# Patient Record
Sex: Female | Born: 1935
Health system: Southern US, Community
[De-identification: ages and names within clinical notes are randomized; demographics above are authoritative.]

## PROBLEM LIST (undated history)

## (undated) DIAGNOSIS — E785 Hyperlipidemia, unspecified: Secondary | ICD-10-CM

## (undated) DIAGNOSIS — I700 Atherosclerosis of aorta: Secondary | ICD-10-CM

## (undated) DIAGNOSIS — R0602 Shortness of breath: Secondary | ICD-10-CM

## (undated) DIAGNOSIS — I10 Essential (primary) hypertension: Secondary | ICD-10-CM

## (undated) DIAGNOSIS — J449 Chronic obstructive pulmonary disease, unspecified: Secondary | ICD-10-CM

## (undated) DIAGNOSIS — M1990 Unspecified osteoarthritis, unspecified site: Secondary | ICD-10-CM

## (undated) DIAGNOSIS — K219 Gastro-esophageal reflux disease without esophagitis: Secondary | ICD-10-CM

## (undated) DIAGNOSIS — R079 Chest pain, unspecified: Secondary | ICD-10-CM

## (undated) DIAGNOSIS — Z9289 Personal history of other medical treatment: Secondary | ICD-10-CM

## (undated) DIAGNOSIS — I2510 Atherosclerotic heart disease of native coronary artery without angina pectoris: Secondary | ICD-10-CM

## (undated) HISTORY — PX: EYE SURGERY: SHX253

## (undated) HISTORY — DX: Shortness of breath: R06.02

## (undated) HISTORY — DX: Chest pain, unspecified: R07.9

## (undated) HISTORY — PX: HERNIA REPAIR: SHX51

## (undated) HISTORY — PX: TUBAL LIGATION: SHX77

## (undated) HISTORY — PX: HAND SURGERY: SHX662

---

## 1996-10-14 HISTORY — PX: JOINT REPLACEMENT: SHX530

## 1999-04-25 ENCOUNTER — Other Ambulatory Visit: Admission: RE | Admit: 1999-04-25 | Discharge: 1999-04-25 | Payer: Self-pay | Admitting: Family Medicine

## 2001-07-28 ENCOUNTER — Other Ambulatory Visit: Admission: RE | Admit: 2001-07-28 | Discharge: 2001-07-28 | Payer: Self-pay | Admitting: Internal Medicine

## 2002-08-28 ENCOUNTER — Ambulatory Visit (HOSPITAL_COMMUNITY): Admission: RE | Admit: 2002-08-28 | Discharge: 2002-08-28 | Payer: Self-pay | Admitting: Internal Medicine

## 2002-08-28 ENCOUNTER — Encounter: Payer: Self-pay | Admitting: Internal Medicine

## 2002-12-22 ENCOUNTER — Ambulatory Visit (HOSPITAL_COMMUNITY): Admission: RE | Admit: 2002-12-22 | Discharge: 2002-12-22 | Payer: Self-pay | Admitting: Gastroenterology

## 2006-09-20 ENCOUNTER — Emergency Department (HOSPITAL_COMMUNITY): Admission: EM | Admit: 2006-09-20 | Discharge: 2006-09-20 | Payer: Self-pay | Admitting: Emergency Medicine

## 2006-09-25 ENCOUNTER — Encounter: Admission: RE | Admit: 2006-09-25 | Discharge: 2006-09-25 | Payer: Self-pay | Admitting: Internal Medicine

## 2012-02-06 DIAGNOSIS — M25569 Pain in unspecified knee: Secondary | ICD-10-CM | POA: Diagnosis not present

## 2012-02-06 DIAGNOSIS — I10 Essential (primary) hypertension: Secondary | ICD-10-CM | POA: Diagnosis not present

## 2012-02-06 DIAGNOSIS — Z79899 Other long term (current) drug therapy: Secondary | ICD-10-CM | POA: Diagnosis not present

## 2012-02-06 DIAGNOSIS — E785 Hyperlipidemia, unspecified: Secondary | ICD-10-CM | POA: Diagnosis not present

## 2012-02-14 DIAGNOSIS — M1710 Unilateral primary osteoarthritis, unspecified knee: Secondary | ICD-10-CM | POA: Diagnosis not present

## 2012-03-03 DIAGNOSIS — I10 Essential (primary) hypertension: Secondary | ICD-10-CM | POA: Diagnosis not present

## 2012-03-03 DIAGNOSIS — M810 Age-related osteoporosis without current pathological fracture: Secondary | ICD-10-CM | POA: Diagnosis not present

## 2012-03-31 DIAGNOSIS — H11159 Pinguecula, unspecified eye: Secondary | ICD-10-CM | POA: Diagnosis not present

## 2012-03-31 DIAGNOSIS — H25019 Cortical age-related cataract, unspecified eye: Secondary | ICD-10-CM | POA: Diagnosis not present

## 2012-03-31 DIAGNOSIS — H2510 Age-related nuclear cataract, unspecified eye: Secondary | ICD-10-CM | POA: Diagnosis not present

## 2012-03-31 DIAGNOSIS — H40009 Preglaucoma, unspecified, unspecified eye: Secondary | ICD-10-CM | POA: Diagnosis not present

## 2012-05-15 DIAGNOSIS — M1710 Unilateral primary osteoarthritis, unspecified knee: Secondary | ICD-10-CM | POA: Diagnosis not present

## 2012-05-20 DIAGNOSIS — I10 Essential (primary) hypertension: Secondary | ICD-10-CM | POA: Diagnosis not present

## 2012-05-20 DIAGNOSIS — E7800 Pure hypercholesterolemia, unspecified: Secondary | ICD-10-CM | POA: Diagnosis not present

## 2012-05-20 DIAGNOSIS — Z01810 Encounter for preprocedural cardiovascular examination: Secondary | ICD-10-CM | POA: Diagnosis not present

## 2012-05-20 DIAGNOSIS — R9431 Abnormal electrocardiogram [ECG] [EKG]: Secondary | ICD-10-CM | POA: Diagnosis not present

## 2012-05-20 DIAGNOSIS — Z87891 Personal history of nicotine dependence: Secondary | ICD-10-CM | POA: Diagnosis not present

## 2012-05-27 ENCOUNTER — Encounter (HOSPITAL_COMMUNITY): Payer: Self-pay

## 2012-05-27 DIAGNOSIS — Z01810 Encounter for preprocedural cardiovascular examination: Secondary | ICD-10-CM | POA: Diagnosis not present

## 2012-05-27 DIAGNOSIS — Z87891 Personal history of nicotine dependence: Secondary | ICD-10-CM | POA: Diagnosis not present

## 2012-05-27 DIAGNOSIS — R9431 Abnormal electrocardiogram [ECG] [EKG]: Secondary | ICD-10-CM | POA: Diagnosis not present

## 2012-05-27 DIAGNOSIS — Z9289 Personal history of other medical treatment: Secondary | ICD-10-CM

## 2012-05-27 DIAGNOSIS — I10 Essential (primary) hypertension: Secondary | ICD-10-CM | POA: Diagnosis not present

## 2012-05-27 HISTORY — DX: Personal history of other medical treatment: Z92.89

## 2012-06-02 ENCOUNTER — Ambulatory Visit (HOSPITAL_COMMUNITY)
Admission: RE | Admit: 2012-06-02 | Discharge: 2012-06-02 | Disposition: A | Discharge status: Home / Self Care | Payer: Medicare Other | Source: Ambulatory Visit | Attending: Orthopedic Surgery | Admitting: Orthopedic Surgery

## 2012-06-02 ENCOUNTER — Encounter (HOSPITAL_COMMUNITY): Payer: Self-pay

## 2012-06-02 ENCOUNTER — Encounter (HOSPITAL_COMMUNITY)
Admission: RE | Admit: 2012-06-02 | Discharge: 2012-06-02 | Disposition: A | Discharge status: Home / Self Care | Payer: Medicare Other | Source: Ambulatory Visit | Attending: Orthopedic Surgery | Admitting: Orthopedic Surgery

## 2012-06-02 DIAGNOSIS — R0602 Shortness of breath: Secondary | ICD-10-CM | POA: Insufficient documentation

## 2012-06-02 DIAGNOSIS — Z01812 Encounter for preprocedural laboratory examination: Secondary | ICD-10-CM | POA: Diagnosis not present

## 2012-06-02 DIAGNOSIS — Z01818 Encounter for other preprocedural examination: Secondary | ICD-10-CM | POA: Diagnosis not present

## 2012-06-02 DIAGNOSIS — I517 Cardiomegaly: Secondary | ICD-10-CM | POA: Diagnosis not present

## 2012-06-02 DIAGNOSIS — I10 Essential (primary) hypertension: Secondary | ICD-10-CM | POA: Insufficient documentation

## 2012-06-02 HISTORY — DX: Essential (primary) hypertension: I10

## 2012-06-02 HISTORY — DX: Unspecified osteoarthritis, unspecified site: M19.90

## 2012-06-02 HISTORY — DX: Hyperlipidemia, unspecified: E78.5

## 2012-06-02 HISTORY — DX: Shortness of breath: R06.02

## 2012-06-02 HISTORY — DX: Gastro-esophageal reflux disease without esophagitis: K21.9

## 2012-06-02 HISTORY — DX: Personal history of other medical treatment: Z92.89

## 2012-06-02 LAB — CBC
Hemoglobin: 13.7 g/dL (ref 12.0–15.0)
MCH: 28.1 pg (ref 26.0–34.0)
Platelets: 261 10*3/uL (ref 150–400)
RBC: 4.87 MIL/uL (ref 3.87–5.11)
WBC: 8.1 10*3/uL (ref 4.0–10.5)

## 2012-06-02 LAB — APTT: aPTT: 35 seconds (ref 24–37)

## 2012-06-02 LAB — URINALYSIS, ROUTINE W REFLEX MICROSCOPIC
Leukocytes, UA: NEGATIVE
Nitrite: NEGATIVE
Protein, ur: NEGATIVE mg/dL
Specific Gravity, Urine: 1.02 (ref 1.005–1.030)
Urobilinogen, UA: 0.2 mg/dL (ref 0.0–1.0)

## 2012-06-02 LAB — BASIC METABOLIC PANEL
Calcium: 9.9 mg/dL (ref 8.4–10.5)
GFR calc non Af Amer: 83 mL/min — ABNORMAL LOW (ref >90)
Glucose, Bld: 98 mg/dL (ref 70–99)
Potassium: 3.7 mEq/L (ref 3.5–5.1)
Sodium: 137 mEq/L (ref 135–145)

## 2012-06-02 LAB — PROTIME-INR
INR: 0.95 (ref 0.00–1.49)
Prothrombin Time: 12.9 seconds (ref 11.6–15.2)

## 2012-06-02 LAB — SURGICAL PCR SCREEN: MRSA, PCR: NEGATIVE

## 2012-06-02 NOTE — Pre-Procedure Instructions (Signed)
PT'S NUCLEAR STRESS TEST REPORT RECEIVED AND ON CHART -NO ISCHEMIA, EF 71%

## 2012-06-02 NOTE — Patient Instructions (Signed)
YOUR SURGERY IS SCHEDULED ON:  Tuesday  8/27  AT 10:30 AM   REPORT TO [facility] AT:  8:00 AM      PHONE # FOR SHORT STAY IS [phone]  DO NOT EAT OR DRINK ANYTHING AFTER MIDNIGHT THE NIGHT BEFORE YOUR SURGERY.  YOU MAY BRUSH YOUR TEETH, RINSE OUT YOUR MOUTH--BUT NO WATER, NO FOOD, NO CHEWING GUM, NO MINTS, NO CANDIES, NO CHEWING TOBACCO.  PLEASE TAKE THE FOLLOWING MEDICATIONS THE AM OF YOUR SURGERY WITH A FEW SIPS OF WATER:    AMLODIPINE    IF YOU USE INHALERS--USE YOUR INHALERS THE AM OF YOUR SURGERY AND BRING INHALERS TO THE HOSPITAL -TAKE TO SURGERY.    IF YOU ARE DIABETIC:  DO NOT TAKE ANY DIABETIC MEDICATIONS THE AM OF YOUR SURGERY.  IF YOU TAKE INSULIN IN THE EVENINGS--PLEASE ONLY TAKE 1/2 NORMAL EVENING DOSE THE NIGHT BEFORE YOUR SURGERY.  NO INSULIN THE AM OF YOUR SURGERY.  IF YOU HAVE SLEEP APNEA AND USE CPAP OR BIPAP--PLEASE BRING THE MASK --NOT THE MACHINE-NOT THE TUBING   -JUST THE MASK. DO NOT BRING VALUABLES, MONEY, CREDIT CARDS.  CONTACT LENS, DENTURES / PARTIALS, GLASSES SHOULD NOT BE WORN TO SURGERY AND IN MOST CASES-HEARING AIDS WILL NEED TO BE REMOVED.  BRING YOUR GLASSES CASE, ANY EQUIPMENT NEEDED FOR YOUR CONTACT LENS. FOR PATIENTS ADMITTED TO THE HOSPITAL--CHECK OUT TIME THE DAY OF DISCHARGE IS 11:00 AM.  ALL INPATIENT ROOMS ARE PRIVATE - WITH BATHROOM, TELEPHONE, TELEVISION AND WIFI INTERNET. IF YOU ARE BEING DISCHARGED THE SAME DAY OF YOUR SURGERY--YOU CAN NOT DRIVE YOURSELF HOME--AND SHOULD NOT GO HOME ALONE BY TAXI OR BUS.  NO DRIVING OR OPERATING MACHINERY FOR 24 HOURS FOLLOWING ANESTHESIA / PAIN MEDICATIONS.                            SPECIAL INSTRUCTIONS:  CHLORHEXIDINE SOAP SHOWER (other brand names are Betasept and Hibiclens ) PLEASE SHOWER WITH CHLORHEXIDINE THE NIGHT BEFORE YOUR SURGERY AND THE AM OF YOUR SURGERY. DO NOT USE CHLORHEXIDINE ON YOUR FACE OR PRIVATE AREAS--YOU MAY USE YOUR NORMAL SOAP THOSE AREAS AND YOUR NORMAL SHAMPOO.    WOMEN SHOULD AVOID SHAVING UNDER ARMS AND SHAVING LEGS 48 HOURS BEFORE USING CHLORHEXIDINE TO AVOID SKIN IRRITATION.  DO NOT USE IF ALLERGIC TO CHLORHEXIDINE.  PLEASE READ OVER ANY  FACT SHEETS THAT YOU WERE GIVEN: MRSA INFORMATION, BLOOD TRANSFUSION INFORMATION, INCENTIVE SPIROMETER INFORMATION.

## 2012-06-02 NOTE — Pre-Procedure Instructions (Signed)
EKG AND CARDIOLOGY OFFICE NOTE ON CHART FROM DR. GANJI -DATED 05/20/12-PREOP EVALUATION FOR CLEARANCE FOR LEFT TOTAL KNEE ARTHROPLASTY.   NOTES SAID STRESS TEST TO BE DONE--AND RESULTS TO BE SENT TO DR. Charlann Boxer.  DR. Nilsa Nutting OFFICE FAXED CARDIAC CLEARANCE FROM DR. GANJI-CLEARANCE PLACED ON PT'S CHART.  REQUESTED DR. GANJI'S OFFICE FAX NUCLEAR STRESS TEST REPORT. CBC, BMET, PT, PTT, UA, CXR WERE DONE TODAY PREOP AT [facility].  T/S WILL BE DONE DAY OF SURGERY. PREOP INSTRUCTIONS DISCUSSED WITH PT USING THE TEACH BACK METHOD.

## 2012-06-03 NOTE — H&P (Signed)
Kaitlin Arnold is an 76 y.o. female.    Chief Complaint: left knee OA and pain   HPI: Pt is a 76 y.o. female complaining of left knee pain for years, significantly increasing over the last year. Pain had continually increased since the beginning. X-rays in the clinic show end-stage arthritic changes of the left knee. Pt has tried various conservative treatments which have failed to alleviate their symptoms, including NSAIDs and steroid injections. Various options are discussed with the patient. Risks, benefits and expectations were discussed with the patient. Patient understand the risks, benefits and expectations and wishes to proceed with surgery.   PCP:  Alva Garnet., MD  D/C Plans:  Home with HHPT  Post-op Meds:   Rx given for ASA, Robaxin, Iron, Colace and MiraLax  Tranexamic Acid:   To be given  Decadron:   To be given  PMH: Past Medical History  Diagnosis Date  . Hypertension   . Hyperlipidemia   . H/O cardiovascular stress test 05/27/12    STRESS TEST WAS DONE for cardiac clearance for left total knee replacement--given clearance by dr. Jacinto Halim.   EF 71%, NO ISCHEMIA  . Shortness of breath     with exertion  . GERD (gastroesophageal reflux disease)     seldom  . Arthritis     oa and pain left knee;  previous right total knee replacement  1998    PSH: Past Surgical History  Procedure Date  . Joint replacement 1998    right total knee replacement  . Hernia repair     38 yrs ago    Social History:  reports that she has been smoking Cigarettes.  She has smoked for the past 40 years. She does not have any smokeless tobacco history on file. She reports that she does not drink alcohol or use illicit drugs.  Allergies:  No Known Allergies  Medications: No current facility-administered medications for this encounter.   Current Outpatient Prescriptions  Medication Sig Dispense Refill  . amLODipine (NORVASC) 5 MG tablet Take 5 mg by mouth every morning.      Marland Kitchen  aspirin 81 MG tablet Take 81 mg by mouth daily.      . Cholecalciferol (VITAMIN D3) 2000 UNITS TABS Take 2,000 Units by mouth daily.      Marland Kitchen ibuprofen (ADVIL,MOTRIN) 200 MG tablet Take 400 mg by mouth every 6 (six) hours as needed.      Marland Kitchen losartan (COZAAR) 25 MG tablet Take 25 mg by mouth at bedtime.      Marland Kitchen losartan-hydrochlorothiazide (HYZAAR) 100-25 MG per tablet Take 1 tablet by mouth daily. Takes in afternoon      . nabumetone (RELAFEN) 500 MG tablet Take 500 mg by mouth 2 (two) times daily. pain      . simvastatin (ZOCOR) 20 MG tablet Take 20 mg by mouth every evening.        Results for orders placed during the hospital encounter of 06/02/12 (from the past 48 hour(s))  SURGICAL PCR SCREEN     Status: Normal   Collection Time   06/02/12  1:08 PM      Component Value Range Comment   MRSA, PCR NEGATIVE  NEGATIVE    Staphylococcus aureus NEGATIVE  NEGATIVE   URINALYSIS, ROUTINE W REFLEX MICROSCOPIC     Status: Normal   Collection Time   06/02/12  1:31 PM      Component Value Range Comment   Color, Urine YELLOW  YELLOW    APPearance CLEAR  CLEAR    Specific Gravity, Urine 1.020  1.005 - 1.030    pH 6.0  5.0 - 8.0    Glucose, UA NEGATIVE  NEGATIVE mg/dL    Hgb urine dipstick NEGATIVE  NEGATIVE    Bilirubin Urine NEGATIVE  NEGATIVE    Ketones, ur NEGATIVE  NEGATIVE mg/dL    Protein, ur NEGATIVE  NEGATIVE mg/dL    Urobilinogen, UA 0.2  0.0 - 1.0 mg/dL    Nitrite NEGATIVE  NEGATIVE    Leukocytes, UA NEGATIVE  NEGATIVE MICROSCOPIC NOT DONE ON URINES WITH NEGATIVE PROTEIN, BLOOD, LEUKOCYTES, NITRITE, OR GLUCOSE <1000 mg/dL.  APTT     Status: Normal   Collection Time   06/02/12  1:50 PM      Component Value Range Comment   aPTT 35  24 - 37 seconds   BASIC METABOLIC PANEL     Status: Abnormal   Collection Time   06/02/12  1:50 PM      Component Value Range Comment   Sodium 137  135 - 145 mEq/L    Potassium 3.7  3.5 - 5.1 mEq/L    Chloride 101  96 - 112 mEq/L    CO2 27  19 - 32 mEq/L      Glucose, Bld 98  70 - 99 mg/dL    BUN 11  6 - 23 mg/dL    Creatinine, Ser 1.30  0.50 - 1.10 mg/dL    Calcium 9.9  8.4 - 86.5 mg/dL    GFR calc non Af Amer 83 (*) >90 mL/min    GFR calc Af Amer >90  >90 mL/min   CBC     Status: Normal   Collection Time   06/02/12  1:50 PM      Component Value Range Comment   WBC 8.1  4.0 - 10.5 K/uL    RBC 4.87  3.87 - 5.11 MIL/uL    Hemoglobin 13.7  12.0 - 15.0 g/dL    HCT 78.4  69.6 - 29.5 %    MCV 82.5  78.0 - 100.0 fL    MCH 28.1  26.0 - 34.0 pg    MCHC 34.1  30.0 - 36.0 g/dL    RDW 28.4  13.2 - 44.0 %    Platelets 261  150 - 400 K/uL   PROTIME-INR     Status: Normal   Collection Time   06/02/12  1:50 PM      Component Value Range Comment   Prothrombin Time 12.9  11.6 - 15.2 seconds    INR 0.95  0.00 - 1.49    Dg Chest 2 View  06/02/2012  *RADIOLOGY REPORT*  Clinical Data: 76 year old female preoperative study for knee surgery.  Shortness of breath with walking.  Hypertension.  CHEST - 2 VIEW  Comparison: Thoracic radiographs 09/25/2006.  Findings: Chronic eventration of the right hemidiaphragm is stable. Lung volumes are stable and within normal limits.  Chronic cardiomegaly does not appear significantly changed.  Tortuous thoracic aorta. Other mediastinal contours are within normal limits.  Visualized tracheal air column is within normal limits. No pneumothorax, pleural effusion or consolidation.  Evidence of chronic increased interstitial markings diffusely.  No confluent pulmonary opacity. No acute osseous abnormality identified.  IMPRESSION: Mild cardiomegaly and chronic pulmonary interstitial changes. No acute cardiopulmonary abnormality.   Original Report Authenticated By: Harley Hallmark, M.D.     ROS: Review of Systems  Constitutional: Negative.   HENT: Negative.   Eyes: Negative.   Respiratory: Negative.  Cardiovascular: Negative.   Gastrointestinal: Negative.   Genitourinary: Negative.   Musculoskeletal: Positive for joint pain.   Skin: Negative.   Neurological: Negative.   Endo/Heme/Allergies: Negative.   Psychiatric/Behavioral: Negative.      Physical Exam: BP:   149/71  ;  HR:   82  ; Resp:   16  : Physical Exam  Constitutional: She is oriented to person, place, and time and well-developed, well-nourished, and in no distress.  HENT:  Head: Normocephalic and atraumatic.  Nose: Nose normal.  Mouth/Throat: Oropharynx is clear and moist.  Eyes: Pupils are equal, round, and reactive to light.  Neck: Neck supple. No JVD present. No tracheal deviation present. No thyromegaly present.  Cardiovascular: Normal rate, regular rhythm, normal heart sounds and intact distal pulses.   Pulmonary/Chest: Effort normal and breath sounds normal.  Abdominal: Soft. There is no tenderness. There is no guarding.  Musculoskeletal:       Left knee: She exhibits decreased range of motion (5-85), swelling and bony tenderness. She exhibits no effusion, no ecchymosis, no deformity, no laceration and no erythema. tenderness found.  Lymphadenopathy:    She has no cervical adenopathy.  Neurological: She is alert and oriented to person, place, and time.  Skin: Skin is warm and dry.  Psychiatric: Affect normal.      Assessment/Plan Assessment: left knee OA and pain   Plan: Patient will undergo a left total knee arthroplasty on 06/09/2012 per Dr. Charlann Boxer at [facility]. Risks benefits and expectations were discussed with the patient. Patient understand risks, benefits and expectations and wishes to proceed.   Anastasio Auerbach Lashanta Elbe   PAC  06/03/2012, 12:20 PM

## 2012-06-09 ENCOUNTER — Encounter (HOSPITAL_COMMUNITY): Payer: Self-pay | Admitting: Anesthesiology

## 2012-06-09 ENCOUNTER — Encounter (HOSPITAL_COMMUNITY): Payer: Self-pay | Admitting: Orthopedic Surgery

## 2012-06-09 ENCOUNTER — Inpatient Hospital Stay (HOSPITAL_COMMUNITY)
Admission: RE | Admit: 2012-06-09 | Discharge: 2012-06-11 | DRG: 470 | Disposition: A | Discharge status: Home Health | Payer: Medicare Other | Source: Ambulatory Visit | Attending: Orthopedic Surgery | Admitting: Orthopedic Surgery

## 2012-06-09 ENCOUNTER — Encounter (HOSPITAL_COMMUNITY)
Admission: RE | Disposition: A | Discharge status: Home Health | Payer: Self-pay | Source: Ambulatory Visit | Attending: Orthopedic Surgery

## 2012-06-09 ENCOUNTER — Encounter (HOSPITAL_COMMUNITY): Payer: Self-pay | Admitting: *Deleted

## 2012-06-09 ENCOUNTER — Ambulatory Visit (HOSPITAL_COMMUNITY): Payer: Medicare Other | Admitting: Anesthesiology

## 2012-06-09 DIAGNOSIS — E871 Hypo-osmolality and hyponatremia: Secondary | ICD-10-CM | POA: Diagnosis not present

## 2012-06-09 DIAGNOSIS — D62 Acute posthemorrhagic anemia: Secondary | ICD-10-CM | POA: Diagnosis not present

## 2012-06-09 DIAGNOSIS — Z79899 Other long term (current) drug therapy: Secondary | ICD-10-CM

## 2012-06-09 DIAGNOSIS — E785 Hyperlipidemia, unspecified: Secondary | ICD-10-CM | POA: Diagnosis present

## 2012-06-09 DIAGNOSIS — M1710 Unilateral primary osteoarthritis, unspecified knee: Secondary | ICD-10-CM | POA: Diagnosis not present

## 2012-06-09 DIAGNOSIS — Z96659 Presence of unspecified artificial knee joint: Secondary | ICD-10-CM

## 2012-06-09 DIAGNOSIS — I10 Essential (primary) hypertension: Secondary | ICD-10-CM | POA: Diagnosis present

## 2012-06-09 DIAGNOSIS — D500 Iron deficiency anemia secondary to blood loss (chronic): Secondary | ICD-10-CM

## 2012-06-09 DIAGNOSIS — IMO0002 Reserved for concepts with insufficient information to code with codable children: Secondary | ICD-10-CM | POA: Diagnosis not present

## 2012-06-09 DIAGNOSIS — R0602 Shortness of breath: Secondary | ICD-10-CM | POA: Diagnosis not present

## 2012-06-09 DIAGNOSIS — F17200 Nicotine dependence, unspecified, uncomplicated: Secondary | ICD-10-CM | POA: Diagnosis present

## 2012-06-09 DIAGNOSIS — K219 Gastro-esophageal reflux disease without esophagitis: Secondary | ICD-10-CM | POA: Diagnosis not present

## 2012-06-09 DIAGNOSIS — N390 Urinary tract infection, site not specified: Secondary | ICD-10-CM

## 2012-06-09 HISTORY — PX: TOTAL KNEE ARTHROPLASTY: SHX125

## 2012-06-09 LAB — ABO/RH: ABO/RH(D): AB POS

## 2012-06-09 LAB — TYPE AND SCREEN
ABO/RH(D): AB POS
Antibody Screen: NEGATIVE

## 2012-06-09 SURGERY — ARTHROPLASTY, KNEE, TOTAL
Anesthesia: Spinal | Site: Knee | Laterality: Left | Wound class: Clean

## 2012-06-09 MED ORDER — LOSARTAN POTASSIUM-HCTZ 100-25 MG PO TABS: 1.0000 | ORAL_TABLET | Freq: Every day | ORAL | Status: DC

## 2012-06-09 MED ORDER — PROPOFOL INFUSION 10 MG/ML OPTIME
INTRAVENOUS | Status: DC | PRN
  Administered 2012-06-09: 140 ug/kg/min via INTRAVENOUS

## 2012-06-09 MED ORDER — METHOCARBAMOL 100 MG/ML IJ SOLN
500.0000 mg | Freq: Four times a day (QID) | INTRAVENOUS | Status: DC | PRN
  Administered 2012-06-09: 500 mg via INTRAVENOUS
  Filled 2012-06-09: qty 5

## 2012-06-09 MED ORDER — PHENOL 1.4 % MT LIQD
1.0000 | OROMUCOSAL | Status: DC | PRN
  Filled 2012-06-09: qty 177

## 2012-06-09 MED ORDER — CEFAZOLIN SODIUM-DEXTROSE 2-3 GM-% IV SOLR
2.0000 g | INTRAVENOUS | Status: AC
  Administered 2012-06-09: 2 g via INTRAVENOUS

## 2012-06-09 MED ORDER — METOCLOPRAMIDE HCL 5 MG/ML IJ SOLN: 5.0000 mg | Freq: Three times a day (TID) | INTRAMUSCULAR | Status: DC | PRN

## 2012-06-09 MED ORDER — CHLORHEXIDINE GLUCONATE 4 % EX LIQD
60.0000 mL | Freq: Once | CUTANEOUS | Status: DC
  Filled 2012-06-09: qty 60

## 2012-06-09 MED ORDER — FLEET ENEMA 7-19 GM/118ML RE ENEM: 1.0000 | ENEMA | Freq: Once | RECTAL | Status: AC | PRN

## 2012-06-09 MED ORDER — RIVAROXABAN 10 MG PO TABS
10.0000 mg | ORAL_TABLET | ORAL | Status: DC
  Administered 2012-06-10 – 2012-06-11 (×2): 10 mg via ORAL
  Filled 2012-06-09 (×3): qty 1

## 2012-06-09 MED ORDER — BUPIVACAINE-EPINEPHRINE PF 0.25-1:200000 % IJ SOLN
INTRAMUSCULAR | Status: DC | PRN
  Administered 2012-06-09: 50 mL

## 2012-06-09 MED ORDER — METOCLOPRAMIDE HCL 10 MG PO TABS: 5.0000 mg | ORAL_TABLET | Freq: Three times a day (TID) | ORAL | Status: DC | PRN

## 2012-06-09 MED ORDER — ONDANSETRON HCL 4 MG/2ML IJ SOLN
INTRAMUSCULAR | Status: DC | PRN
  Administered 2012-06-09: 4 mg via INTRAVENOUS

## 2012-06-09 MED ORDER — HYDROMORPHONE HCL PF 1 MG/ML IJ SOLN
INTRAMUSCULAR | Status: AC
  Administered 2012-06-09: 0.5 mg via INTRAVENOUS
  Filled 2012-06-09: qty 1

## 2012-06-09 MED ORDER — KETOROLAC TROMETHAMINE 30 MG/ML IJ SOLN
INTRAMUSCULAR | Status: AC
  Filled 2012-06-09: qty 1

## 2012-06-09 MED ORDER — HYDROMORPHONE HCL PF 1 MG/ML IJ SOLN
0.5000 mg | INTRAMUSCULAR | Status: DC | PRN
  Administered 2012-06-09: 0.5 mg via INTRAVENOUS
  Administered 2012-06-09: 1 mg via INTRAVENOUS
  Filled 2012-06-09 (×2): qty 1

## 2012-06-09 MED ORDER — FENTANYL CITRATE 0.05 MG/ML IJ SOLN
INTRAMUSCULAR | Status: DC | PRN
  Administered 2012-06-09 (×2): 50 ug via INTRAVENOUS

## 2012-06-09 MED ORDER — MENTHOL 3 MG MT LOZG
1.0000 | LOZENGE | OROMUCOSAL | Status: DC | PRN
  Filled 2012-06-09: qty 9

## 2012-06-09 MED ORDER — PROPOFOL 10 MG/ML IV BOLUS
INTRAVENOUS | Status: DC | PRN
  Administered 2012-06-09: 20 mg via INTRAVENOUS
  Administered 2012-06-09: 30 mg via INTRAVENOUS

## 2012-06-09 MED ORDER — BUPIVACAINE HCL 0.75 % IJ SOLN
INTRAMUSCULAR | Status: DC | PRN
  Administered 2012-06-09: 15 mg via INTRATHECAL

## 2012-06-09 MED ORDER — OXYCODONE HCL 5 MG PO TABS
5.0000 mg | ORAL_TABLET | Freq: Once | ORAL | Status: DC | PRN
Start: 2012-06-09 — End: 2012-06-09

## 2012-06-09 MED ORDER — ACETAMINOPHEN 10 MG/ML IV SOLN
INTRAVENOUS | Status: AC
  Filled 2012-06-09: qty 100

## 2012-06-09 MED ORDER — SODIUM CHLORIDE 0.9 % IV SOLN
INTRAVENOUS | Status: AC
  Administered 2012-06-09 – 2012-06-10 (×2): via INTRAVENOUS
  Filled 2012-06-09 (×4): qty 1000

## 2012-06-09 MED ORDER — SODIUM CHLORIDE 0.9 % IR SOLN
Status: DC | PRN
  Administered 2012-06-09: 1000 mL

## 2012-06-09 MED ORDER — OXYCODONE HCL 5 MG/5ML PO SOLN
5.0000 mg | Freq: Once | ORAL | Status: DC | PRN
Start: 2012-06-09 — End: 2012-06-09
  Filled 2012-06-09: qty 5

## 2012-06-09 MED ORDER — BISACODYL 10 MG RE SUPP: 10.0000 mg | Freq: Every day | RECTAL | Status: DC | PRN

## 2012-06-09 MED ORDER — LACTATED RINGERS IV SOLN
INTRAVENOUS | Status: DC
  Administered 2012-06-09: 11:00:00 via INTRAVENOUS
  Administered 2012-06-09: 1000 mL via INTRAVENOUS
  Administered 2012-06-09: 14:00:00 via INTRAVENOUS

## 2012-06-09 MED ORDER — MEPERIDINE HCL 50 MG/ML IJ SOLN: 6.2500 mg | INTRAMUSCULAR | Status: DC | PRN

## 2012-06-09 MED ORDER — BUPIVACAINE-EPINEPHRINE 0.25% -1:200000 IJ SOLN
INTRAMUSCULAR | Status: AC
  Filled 2012-06-09: qty 1

## 2012-06-09 MED ORDER — POLYETHYLENE GLYCOL 3350 17 G PO PACK
17.0000 g | PACK | Freq: Two times a day (BID) | ORAL | Status: DC
  Administered 2012-06-09 – 2012-06-11 (×4): 17 g via ORAL

## 2012-06-09 MED ORDER — CELECOXIB 200 MG PO CAPS
200.0000 mg | ORAL_CAPSULE | Freq: Two times a day (BID) | ORAL | Status: DC
  Administered 2012-06-09 – 2012-06-11 (×4): 200 mg via ORAL
  Filled 2012-06-09 (×5): qty 1

## 2012-06-09 MED ORDER — DEXAMETHASONE SODIUM PHOSPHATE 10 MG/ML IJ SOLN
10.0000 mg | Freq: Once | INTRAMUSCULAR | Status: AC
  Administered 2012-06-09: 10 mg via INTRAVENOUS

## 2012-06-09 MED ORDER — DOCUSATE SODIUM 100 MG PO CAPS
100.0000 mg | ORAL_CAPSULE | Freq: Two times a day (BID) | ORAL | Status: DC
  Administered 2012-06-09 – 2012-06-11 (×4): 100 mg via ORAL

## 2012-06-09 MED ORDER — LOSARTAN POTASSIUM 50 MG PO TABS
100.0000 mg | ORAL_TABLET | Freq: Every day | ORAL | Status: DC
  Administered 2012-06-09 – 2012-06-11 (×3): 100 mg via ORAL
  Filled 2012-06-09 (×3): qty 2

## 2012-06-09 MED ORDER — SIMVASTATIN 20 MG PO TABS
20.0000 mg | ORAL_TABLET | Freq: Every evening | ORAL | Status: DC
  Administered 2012-06-09 – 2012-06-10 (×2): 20 mg via ORAL
  Filled 2012-06-09 (×3): qty 1

## 2012-06-09 MED ORDER — ZOLPIDEM TARTRATE 5 MG PO TABS: 5.0000 mg | ORAL_TABLET | Freq: Every evening | ORAL | Status: DC | PRN

## 2012-06-09 MED ORDER — HYDROCODONE-ACETAMINOPHEN 7.5-325 MG PO TABS
1.0000 | ORAL_TABLET | ORAL | Status: DC
  Administered 2012-06-09: 2 via ORAL
  Administered 2012-06-09 (×2): 1 via ORAL
  Administered 2012-06-10 (×2): 2 via ORAL
  Filled 2012-06-09 (×2): qty 2
  Filled 2012-06-09: qty 1
  Filled 2012-06-09: qty 2
  Filled 2012-06-09: qty 1

## 2012-06-09 MED ORDER — ONDANSETRON HCL 4 MG PO TABS: 4.0000 mg | ORAL_TABLET | Freq: Four times a day (QID) | ORAL | Status: DC | PRN

## 2012-06-09 MED ORDER — ACETAMINOPHEN 10 MG/ML IV SOLN
INTRAVENOUS | Status: DC | PRN
  Administered 2012-06-09: 1000 mg via INTRAVENOUS

## 2012-06-09 MED ORDER — CEFAZOLIN SODIUM-DEXTROSE 2-3 GM-% IV SOLR
INTRAVENOUS | Status: AC
  Filled 2012-06-09: qty 50

## 2012-06-09 MED ORDER — PROPOFOL 10 MG/ML IV EMUL: INTRAVENOUS | Status: DC | PRN

## 2012-06-09 MED ORDER — PROMETHAZINE HCL 25 MG/ML IJ SOLN
6.2500 mg | INTRAMUSCULAR | Status: DC | PRN
Start: 2012-06-09 — End: 2012-06-09

## 2012-06-09 MED ORDER — PHENYLEPHRINE HCL 10 MG/ML IJ SOLN
INTRAMUSCULAR | Status: DC | PRN
  Administered 2012-06-09 (×2): 100 ug via INTRAVENOUS

## 2012-06-09 MED ORDER — CEFAZOLIN SODIUM-DEXTROSE 2-3 GM-% IV SOLR
2.0000 g | Freq: Four times a day (QID) | INTRAVENOUS | Status: AC
  Administered 2012-06-09 (×2): 2 g via INTRAVENOUS
  Filled 2012-06-09 (×2): qty 50

## 2012-06-09 MED ORDER — HYDROCHLOROTHIAZIDE 25 MG PO TABS
25.0000 mg | ORAL_TABLET | Freq: Every day | ORAL | Status: DC
  Administered 2012-06-09 – 2012-06-11 (×3): 25 mg via ORAL
  Filled 2012-06-09 (×3): qty 1

## 2012-06-09 MED ORDER — FERROUS SULFATE 325 (65 FE) MG PO TABS
325.0000 mg | ORAL_TABLET | Freq: Three times a day (TID) | ORAL | Status: DC
  Administered 2012-06-09 – 2012-06-11 (×5): 325 mg via ORAL
  Filled 2012-06-09 (×8): qty 1

## 2012-06-09 MED ORDER — KETOROLAC TROMETHAMINE 30 MG/ML IJ SOLN
INTRAMUSCULAR | Status: DC | PRN
  Administered 2012-06-09: 30 mg

## 2012-06-09 MED ORDER — TRANEXAMIC ACID 100 MG/ML IV SOLN
1490.0000 mg | Freq: Once | INTRAVENOUS | Status: AC
  Administered 2012-06-09: 1490 mg via INTRAVENOUS
  Filled 2012-06-09: qty 14.9

## 2012-06-09 MED ORDER — DEXAMETHASONE SODIUM PHOSPHATE 10 MG/ML IJ SOLN
10.0000 mg | Freq: Once | INTRAMUSCULAR | Status: AC
  Administered 2012-06-10: 10 mg via INTRAVENOUS
  Filled 2012-06-09: qty 1

## 2012-06-09 MED ORDER — LOSARTAN POTASSIUM 25 MG PO TABS
25.0000 mg | ORAL_TABLET | Freq: Every day | ORAL | Status: DC
  Administered 2012-06-10: 25 mg via ORAL
  Filled 2012-06-09 (×3): qty 1

## 2012-06-09 MED ORDER — DIPHENHYDRAMINE HCL 25 MG PO CAPS: 25.0000 mg | ORAL_CAPSULE | Freq: Four times a day (QID) | ORAL | Status: DC | PRN

## 2012-06-09 MED ORDER — METHOCARBAMOL 500 MG PO TABS
500.0000 mg | ORAL_TABLET | Freq: Four times a day (QID) | ORAL | Status: DC | PRN
  Administered 2012-06-10: 500 mg via ORAL
  Filled 2012-06-09: qty 1

## 2012-06-09 MED ORDER — AMLODIPINE BESYLATE 5 MG PO TABS
5.0000 mg | ORAL_TABLET | Freq: Every morning | ORAL | Status: DC
Start: 2012-06-10 — End: 2012-06-11
  Administered 2012-06-10 – 2012-06-11 (×2): 5 mg via ORAL
  Filled 2012-06-09 (×2): qty 1

## 2012-06-09 MED ORDER — 0.9 % SODIUM CHLORIDE (POUR BTL) OPTIME
TOPICAL | Status: DC | PRN
  Administered 2012-06-09: 1000 mL

## 2012-06-09 MED ORDER — ALUM & MAG HYDROXIDE-SIMETH 200-200-20 MG/5ML PO SUSP: 30.0000 mL | ORAL | Status: DC | PRN

## 2012-06-09 MED ORDER — HYDROMORPHONE HCL PF 1 MG/ML IJ SOLN
0.2500 mg | INTRAMUSCULAR | Status: DC | PRN
  Administered 2012-06-09: 0.5 mg via INTRAVENOUS

## 2012-06-09 MED ORDER — ONDANSETRON HCL 4 MG/2ML IJ SOLN: 4.0000 mg | Freq: Four times a day (QID) | INTRAMUSCULAR | Status: DC | PRN

## 2012-06-09 SURGICAL SUPPLY — 61 items
ADH SKN CLS APL DERMABOND .7 (GAUZE/BANDAGES/DRESSINGS) ×1
BAG SPEC THK2 15X12 ZIP CLS (MISCELLANEOUS) ×1
BAG ZIPLOCK 12X15 (MISCELLANEOUS) ×2 IMPLANT
BANDAGE ELASTIC 6 VELCRO ST LF (GAUZE/BANDAGES/DRESSINGS) ×2 IMPLANT
BANDAGE ESMARK 6X9 LF (GAUZE/BANDAGES/DRESSINGS) ×1 IMPLANT
BLADE SAW SGTL 13.0X1.19X90.0M (BLADE) ×2 IMPLANT
BNDG CMPR 9X6 STRL LF SNTH (GAUZE/BANDAGES/DRESSINGS) ×1
BNDG ESMARK 6X9 LF (GAUZE/BANDAGES/DRESSINGS) ×2
BOWL SMART MIX CTS (DISPOSABLE) ×2 IMPLANT
CEMENT HV SMART SET (Cement) ×2 IMPLANT
CLOTH BEACON ORANGE TIMEOUT ST (SAFETY) ×2 IMPLANT
CUFF TOURN SGL QUICK 34 (TOURNIQUET CUFF) ×2
CUFF TRNQT CYL 34X4X40X1 (TOURNIQUET CUFF) ×1 IMPLANT
DECANTER SPIKE VIAL GLASS SM (MISCELLANEOUS) ×2 IMPLANT
DERMABOND ADVANCED (GAUZE/BANDAGES/DRESSINGS) ×1
DERMABOND ADVANCED .7 DNX12 (GAUZE/BANDAGES/DRESSINGS) ×1 IMPLANT
DRAPE EXTREMITY T 121X128X90 (DRAPE) ×2 IMPLANT
DRAPE POUCH INSTRU U-SHP 10X18 (DRAPES) ×2 IMPLANT
DRAPE U-SHAPE 47X51 STRL (DRAPES) ×2 IMPLANT
DRSG AQUACEL AG ADV 3.5X10 (GAUZE/BANDAGES/DRESSINGS) ×2 IMPLANT
DRSG TEGADERM 4X4.75 (GAUZE/BANDAGES/DRESSINGS) ×2 IMPLANT
DURAPREP 26ML APPLICATOR (WOUND CARE) ×2 IMPLANT
ELECT REM PT RETURN 9FT ADLT (ELECTROSURGICAL) ×2
ELECTRODE REM PT RTRN 9FT ADLT (ELECTROSURGICAL) ×1 IMPLANT
EVACUATOR 1/8 PVC DRAIN (DRAIN) ×2 IMPLANT
FACESHIELD LNG OPTICON STERILE (SAFETY) ×10 IMPLANT
GAUZE SPONGE 2X2 8PLY STRL LF (GAUZE/BANDAGES/DRESSINGS) ×1 IMPLANT
GLOVE BIOGEL PI IND STRL 6.5 (GLOVE) IMPLANT
GLOVE BIOGEL PI IND STRL 7.5 (GLOVE) ×1 IMPLANT
GLOVE BIOGEL PI IND STRL 8 (GLOVE) ×1 IMPLANT
GLOVE BIOGEL PI INDICATOR 6.5 (GLOVE) ×1
GLOVE BIOGEL PI INDICATOR 7.5 (GLOVE) ×1
GLOVE BIOGEL PI INDICATOR 8 (GLOVE) ×1
GLOVE ECLIPSE 8.0 STRL XLNG CF (GLOVE) ×2 IMPLANT
GLOVE ORTHO TXT STRL SZ7.5 (GLOVE) ×5 IMPLANT
GLOVE SURG SS PI 6.5 STRL IVOR (GLOVE) ×4 IMPLANT
GOWN BRE IMP PREV XXLGXLNG (GOWN DISPOSABLE) ×3 IMPLANT
GOWN STRL NON-REIN LRG LVL3 (GOWN DISPOSABLE) ×4 IMPLANT
HANDPIECE INTERPULSE COAX TIP (DISPOSABLE) ×2
IMMOBILIZER KNEE 20 (SOFTGOODS) ×2
IMMOBILIZER KNEE 20 THIGH 36 (SOFTGOODS) IMPLANT
KIT BASIN OR (CUSTOM PROCEDURE TRAY) ×2 IMPLANT
MANIFOLD NEPTUNE II (INSTRUMENTS) ×2 IMPLANT
NDL SAFETY ECLIPSE 18X1.5 (NEEDLE) ×1 IMPLANT
NEEDLE HYPO 18GX1.5 SHARP (NEEDLE) ×2
NS IRRIG 1000ML POUR BTL (IV SOLUTION) ×3 IMPLANT
PACK TOTAL JOINT (CUSTOM PROCEDURE TRAY) ×2 IMPLANT
POSITIONER SURGICAL ARM (MISCELLANEOUS) ×2 IMPLANT
SET HNDPC FAN SPRY TIP SCT (DISPOSABLE) ×1 IMPLANT
SET PAD KNEE POSITIONER (MISCELLANEOUS) ×2 IMPLANT
SPONGE GAUZE 2X2 STER 10/PKG (GAUZE/BANDAGES/DRESSINGS) ×1
SUCTION FRAZIER 12FR DISP (SUCTIONS) ×2 IMPLANT
SUT MNCRL AB 4-0 PS2 18 (SUTURE) ×2 IMPLANT
SUT VIC AB 1 CT1 36 (SUTURE) ×5 IMPLANT
SUT VIC AB 2-0 CT1 27 (SUTURE) ×6
SUT VIC AB 2-0 CT1 TAPERPNT 27 (SUTURE) ×3 IMPLANT
SYR 50ML LL SCALE MARK (SYRINGE) ×2 IMPLANT
TOWEL OR 17X26 10 PK STRL BLUE (TOWEL DISPOSABLE) ×4 IMPLANT
TRAY FOLEY CATH 14FRSI W/METER (CATHETERS) ×2 IMPLANT
WATER STERILE IRR 1500ML POUR (IV SOLUTION) ×2 IMPLANT
WRAP KNEE MAXI GEL POST OP (GAUZE/BANDAGES/DRESSINGS) ×2 IMPLANT

## 2012-06-09 NOTE — Interval H&P Note (Signed)
History and Physical Interval Note:  06/09/2012 8:48 AM  Kaitlin Arnold  has presented today for surgery, with the diagnosis of left knee osteoarthritis  The various methods of treatment have been discussed with the patient and family. After consideration of risks, benefits and other options for treatment, the patient has consented to  Procedure(s) (LRB): LEFT TOTAL KNEE ARTHROPLASTY (Left) as a surgical intervention .  The patient's history has been reviewed, patient examined, no change in status, stable for surgery.  I have reviewed the patient's chart and labs.  Questions were answered to the patient's satisfaction.     Shelda Pal

## 2012-06-09 NOTE — Preoperative (Signed)
Beta Blockers   Reason not to administer Beta Blockers:Not Applicable 

## 2012-06-09 NOTE — Anesthesia Procedure Notes (Signed)
Spinal  Patient location during procedure: OR Start time: 06/09/2012 10:23 AM End time: 06/09/2012 10:28 AM Staffing Anesthesiologist: Lewie Loron R Performed by: anesthesiologist  Preanesthetic Checklist Completed: patient identified, site marked, surgical consent, pre-op evaluation, timeout performed, IV checked, risks and benefits discussed and monitors and equipment checked Spinal Block Patient position: sitting Prep: ChloraPrep Patient monitoring: heart rate, continuous pulse ox and blood pressure Approach: midline Location: L2-3 Injection technique: single-shot Needle Needle type: Sprotte  Needle gauge: 24 G Needle length: 9 cm Needle insertion depth: 5 cm Assessment Sensory level: T8 Additional Notes Expiration date of kit checked and confirmed. Patient tolerated procedure well, without complications.

## 2012-06-09 NOTE — Op Note (Signed)
NAME:  Kaitlin Arnold                      MEDICAL RECORD NO.:  [id number]                             FACILITY:  [facility]      PHYSICIAN:  Madlyn Frankel. Charlann Boxer, M.D.  DATE OF BIRTH:  1936/04/10      DATE OF PROCEDURE:  06/09/2012                                     OPERATIVE REPORT         PREOPERATIVE DIAGNOSIS:  Left knee osteoarthritis.      POSTOPERATIVE DIAGNOSIS:  Left knee osteoarthritis.      FINDINGS:  The patient was noted to have complete loss of cartilage and   bone-on-bone arthritis with associated osteophytes in all 3 compartments of   the knee with a significant synovitis and associated effusion.      PROCEDURE:  Left total knee replacement.      COMPONENTS USED:  DePuy rotating platform posterior stabilized knee   system, a size 3 femur, 4 tibia, 15 mm PS insert, and 41 patellar   button.      SURGEON:  Madlyn Frankel. Charlann Boxer, M.D.      ASSISTANT:  Lanney Gins, PA-C.      ANESTHESIA:  Spinal.      SPECIMENS:  None.      COMPLICATION:  None.      DRAINS:  One Hemovac.  EBL: <200cc      TOURNIQUET TIME:   Total Tourniquet Time Documented: Thigh (Left) - 47 minutes .      The patient was stable to the recovery room.      INDICATION FOR PROCEDURE:  Kaitlin Arnold is a 76 y.o. female patient of   mine.  The patient had been seen, evaluated, and treated conservatively in the   office with medication, activity modification, and injections.  The patient had   radiographic changes of bone-on-bone arthritis with endplate sclerosis and osteophytes noted.      The patient failed conservative measures including medication, injections, and activity modification, and at this point was ready for more definitive measures.   Based on the radiographic changes and failed conservative measures, the patient   decided to proceed with total knee replacement.  Risks of infection,   DVT, component failure, need for revision surgery, postop course, and   expectations were all   discussed and reviewed.  Consent was obtained for benefit of pain   relief.      PROCEDURE IN DETAIL:  The patient was brought to the operative theater.   Once adequate anesthesia, preoperative antibiotics, 2 gm of Ancef administered, the patient was positioned supine with the left thigh tourniquet placed.  The  left lower extremity was prepped and draped in sterile fashion.  A time-   out was performed identifying the patient, planned procedure, and   extremity.      The left lower extremity was placed in the [facility] leg holder.  The leg was   exsanguinated, tourniquet elevated to 250 mmHg.  A midline incision was   made followed by median parapatellar arthrotomy.  Following initial   exposure, attention was first directed to the patella.  Precut   measurement  was noted to be 24 mm.  I resected down to 14 mm and used a   41 patellar button to restore patellar height as well as cover the cut   surface.      The lug holes were drilled and a metal shim was placed to protect the   patella from retractors and saw blades.      At this point, attention was now directed to the femur.  The femoral   canal was opened with a drill, irrigated to try to prevent fat emboli.  An   intramedullary rod was passed at 3 degrees valgus, 10 mm of bone was   resected off the distal femur.  Following this resection, the tibia was   subluxated anteriorly.  Using the extramedullary guide, 10 mm of bone was resected off   the proximal lateral tibia.  We confirmed the gap would be   stable medially and laterally with a 10 mm insert as well as confirmed   the cut was perpendicular in the coronal plane, checking with an alignment rod.      Once this was done, I sized the femur to be a size 3 in the anterior-   posterior dimension, chose a standard component based on medial and   lateral dimension.  The size 3 rotation block was then pinned in   position anterior referenced using the C-clamp to set rotation.  The     anterior, posterior, and  chamfer cuts were made without difficulty nor   notching making certain that I was along the anterior cortex to help   with flexion gap stability.      The final box cut was made off the lateral aspect of distal femur.      At this point, the tibia was sized to be a size 4, the size 4 tray was   then pinned in position through the medial third of the tubercle,   drilled, and keel punched.  Trial reduction was now carried with a 3 femur,  4 tibia, a 12.5 mm insert, and the 41 patella botton.  The knee was brought to   extension, full extension with good flexion stability with the patella   tracking through the trochlea without application of pressure.  I did re-trial with a 15mm insert and felt that this was more stable from extension to flexion.  Given   all these findings, the trial components removed.  Final components were   opened and cement was mixed.  The knee was irrigated with normal saline   solution and pulse lavage.  The synovial lining was   then injected with 0.25% Marcaine with epinephrine and 1 cc of Toradol,   total of 61 cc.      The knee was irrigated.  Final implants were then cemented onto clean and   dried cut surfaces of bone with the knee brought to extension with a 15   mm trial insert.      Once the cement had fully cured, the excess cement was removed   throughout the knee.  I confirmed I was satisfied with the range of   motion and stability, and the final 15 mm PS insert was chosen.  It was   placed into the knee.      The tourniquet had been let down at 47 minutes.  No significant   hemostasis required.  The medium Hemovac drain was placed deep.  The   extensor mechanism was then reapproximated using #  1 Vicryl with the knee   in flexion.  The   remaining wound was closed with 2-0 Vicryl and running 4-0 Monocryl.   The knee was cleaned, dried, dressed sterilely using Dermabond and   Aquacel dressing.  Drain site dressed  separately.  The patient was then   brought to recovery room in stable condition, tolerating the procedure   well.   Please note that Physician Assistant, Lanney Gins, was present for the entirety of the case, and was utilized for pre-operative positioning, peri-operative retractor management, general facilitation of the procedure.  He was also utilized for primary wound closure at the end of the case.              Madlyn Frankel Charlann Boxer, M.D.

## 2012-06-09 NOTE — Transfer of Care (Signed)
Immediate Anesthesia Transfer of Care Note  Patient: Kaitlin Arnold  Procedure(s) Performed: Procedure(s) (LRB): TOTAL KNEE ARTHROPLASTY (Left)  Patient Location: PACU  Anesthesia Type: Spinal  Level of Consciousness: awake and alert   Airway & Oxygen Therapy: Patient Spontanous Breathing and Patient connected to face mask oxygen  Post-op Assessment: Report given to PACU RN and Post -op Vital signs reviewed and stable  Post vital signs: Reviewed and stable  Complications: No apparent anesthesia complications

## 2012-06-09 NOTE — Anesthesia Preprocedure Evaluation (Addendum)
Anesthesia Evaluation  Patient identified by MRN, date of birth, ID band Patient awake    Reviewed: Allergy & Precautions, H&P , NPO status , Patient's Chart, lab work & pertinent test results  Airway Mallampati: II TM Distance: >3 FB Neck ROM: Full    Dental  (+) Edentulous Upper, Partial Lower and Dental Advisory Given   Pulmonary  breath sounds clear to auscultation  Pulmonary exam normal       Cardiovascular hypertension, Pt. on medications - CHF Rhythm:Regular Rate:Normal     Neuro/Psych negative neurological ROS     GI/Hepatic Neg liver ROS, GERD-  Controlled,  Endo/Other  negative endocrine ROS  Renal/GU negative Renal ROS     Musculoskeletal negative musculoskeletal ROS (+)   Abdominal (+) + obese,   Peds  Hematology negative hematology ROS (+)   Anesthesia Other Findings   Reproductive/Obstetrics                          Anesthesia Physical Anesthesia Plan  ASA: II  Anesthesia Plan: Spinal   Post-op Pain Management:    Induction: Intravenous  Airway Management Planned: Simple Face Mask  Additional Equipment:   Intra-op Plan:   Post-operative Plan:   Informed Consent: I have reviewed the patients History and Physical, chart, labs and discussed the procedure including the risks, benefits and alternatives for the proposed anesthesia with the patient or authorized representative who has indicated his/her understanding and acceptance.   Dental advisory given  Plan Discussed with: CRNA and Surgeon  Anesthesia Plan Comments:         Anesthesia Quick Evaluation

## 2012-06-09 NOTE — Anesthesia Postprocedure Evaluation (Signed)
Anesthesia Post Note  Patient: Kaitlin Arnold  Procedure(s) Performed: Procedure(s) (LRB): TOTAL KNEE ARTHROPLASTY (Left)  Anesthesia type: Spinal  Patient location: PACU  Post pain: Pain level controlled  Post assessment: Post-op Vital signs reviewed  Last Vitals: BP 132/71  Pulse 80  Temp 36.8 C  Resp 14  SpO2 100%  Post vital signs: Reviewed  Level of consciousness: sedated  Complications: No apparent anesthesia complications

## 2012-06-09 NOTE — Progress Notes (Signed)
Utilization review completed.  

## 2012-06-10 DIAGNOSIS — N390 Urinary tract infection, site not specified: Secondary | ICD-10-CM

## 2012-06-10 DIAGNOSIS — D500 Iron deficiency anemia secondary to blood loss (chronic): Secondary | ICD-10-CM

## 2012-06-10 LAB — URINALYSIS, ROUTINE W REFLEX MICROSCOPIC
Bilirubin Urine: NEGATIVE
Glucose, UA: NEGATIVE mg/dL
Specific Gravity, Urine: 1.035 — ABNORMAL HIGH (ref 1.005–1.030)
Urobilinogen, UA: 0.2 mg/dL (ref 0.0–1.0)
pH: 6 (ref 5.0–8.0)

## 2012-06-10 LAB — BASIC METABOLIC PANEL
BUN: 18 mg/dL (ref 6–23)
CO2: 24 mEq/L (ref 19–32)
Chloride: 100 mEq/L (ref 96–112)
Glucose, Bld: 140 mg/dL — ABNORMAL HIGH (ref 70–99)
Potassium: 4 mEq/L (ref 3.5–5.1)
Sodium: 133 mEq/L — ABNORMAL LOW (ref 135–145)

## 2012-06-10 LAB — CBC
HCT: 35.4 % — ABNORMAL LOW (ref 36.0–46.0)
Hemoglobin: 11.9 g/dL — ABNORMAL LOW (ref 12.0–15.0)
MCHC: 33.6 g/dL (ref 30.0–36.0)
RBC: 4.32 MIL/uL (ref 3.87–5.11)

## 2012-06-10 MED ORDER — SULFAMETHOXAZOLE-TMP DS 800-160 MG PO TABS
1.0000 | ORAL_TABLET | Freq: Two times a day (BID) | ORAL | Status: DC
  Administered 2012-06-10 – 2012-06-11 (×2): 1 via ORAL
  Filled 2012-06-10 (×4): qty 1

## 2012-06-10 MED ORDER — OXYCODONE HCL 5 MG PO TABS
5.0000 mg | ORAL_TABLET | ORAL | Status: DC | PRN
  Administered 2012-06-10: 5 mg via ORAL
  Administered 2012-06-10 – 2012-06-11 (×3): 10 mg via ORAL
  Filled 2012-06-10: qty 2
  Filled 2012-06-10: qty 1
  Filled 2012-06-10: qty 2
  Filled 2012-06-10: qty 1
  Filled 2012-06-10: qty 2

## 2012-06-10 NOTE — Progress Notes (Signed)
Physical Therapy Treatment Patient Details Name: Kaitlin Arnold MRN: [id number] DOB: 02-23-1936 Today's Date: 06/10/2012 Time: 4098-1191 PT Time Calculation (min): 17 min  PT Assessment / Plan / Recommendation Comments on Treatment Session       Follow Up Recommendations  Home health PT    Barriers to Discharge        Equipment Recommendations  None recommended by PT    Recommendations for Other Services OT consult  Frequency 7X/week   Plan Discharge plan remains appropriate    Precautions / Restrictions Precautions Precautions: Knee Required Braces or Orthoses: Knee Immobilizer - Left Knee Immobilizer - Left: Discontinue once straight leg raise with < 10 degree lag Restrictions Weight Bearing Restrictions: No Other Position/Activity Restrictions: WBAT   Pertinent Vitals/Pain 4/10    Mobility  Bed Mobility Bed Mobility: Supine to Sit Supine to Sit: 4: Min assist Details for Bed Mobility Assistance: cues for sequence and use of R LE to self assist Transfers Transfers: Sit to Stand;Stand to Sit Sit to Stand: 4: Min assist;3: Mod assist Stand to Sit: 4: Min assist Details for Transfer Assistance: cues for use of UEs to self assist and for LE management Ambulation/Gait Ambulation/Gait Assistance: 4: Min assist Ambulation Distance (Feet): 31 Feet Assistive device: Rolling walker Ambulation/Gait Assistance Details: cues for posture, sequence and position from RW Gait Pattern: Step-to pattern    Exercises     PT Diagnosis:    PT Problem List:   PT Treatment Interventions:     PT Goals Acute Rehab PT Goals PT Goal Formulation: With patient Time For Goal Achievement: 06/13/12 Potential to Achieve Goals: Good Pt will go Supine/Side to Sit: with supervision PT Goal: Supine/Side to Sit - Progress: Goal set today Pt will go Sit to Supine/Side: with supervision PT Goal: Sit to Supine/Side - Progress: Goal set today Pt will go Sit to Stand: with supervision PT Goal:  Sit to Stand - Progress: Goal set today Pt will go Stand to Sit: with supervision PT Goal: Stand to Sit - Progress: Goal set today Pt will Ambulate: 51 - 150 feet;with supervision;with rolling walker PT Goal: Ambulate - Progress: Goal set today Pt will Go Up / Down Stairs: 1-2 stairs;with min assist;with least restrictive assistive device PT Goal: Up/Down Stairs - Progress: Goal set today  Visit Information  Last PT Received On: 06/10/12 Assistance Needed: +1    Subjective Data  Subjective: I'm doing ok Patient Stated Goal: Resume previous lifestyle with decreased pain   Cognition  Overall Cognitive Status: Appears within functional limits for tasks assessed/performed Arousal/Alertness: Awake/alert Orientation Level: Appears intact for tasks assessed Behavior During Session: [facility] for tasks performed    Balance     End of Session PT - End of Session Activity Tolerance: Patient tolerated treatment well Patient left: in chair;with call bell/phone within reach;with family/visitor present Nurse Communication: Mobility status   GP     Juniper Snyders 06/10/2012, 12:06 PM

## 2012-06-10 NOTE — Progress Notes (Signed)
Physical Therapy Treatment Patient Details Name: Kaitlin Arnold MRN: [id number] DOB: [birth year] Today's Date: 06/10/2012 Time: 0102-7253 PT Time Calculation (min): 15 min  PT Assessment / Plan / Recommendation Comments on Treatment Session       Follow Up Recommendations  Home health PT    Barriers to Discharge        Equipment Recommendations  None recommended by PT    Recommendations for Other Services OT consult  Frequency 7X/week   Plan Discharge plan remains appropriate    Precautions / Restrictions Precautions Precautions: Knee Required Braces or Orthoses: Knee Immobilizer - Left Knee Immobilizer - Left: Discontinue once straight leg raise with < 10 degree lag Restrictions Weight Bearing Restrictions: No Other Position/Activity Restrictions: WBAT   Pertinent Vitals/Pain 4/10; premedicated    Mobility  Transfers Transfers: Sit to Stand;Stand to Sit Sit to Stand: 4: Min assist Stand to Sit: 4: Min assist Details for Transfer Assistance: cues for use of UEs to self assist and for LE management Ambulation/Gait Ambulation/Gait Assistance: 4: Min assist Ambulation Distance (Feet): 78 Feet Assistive device: Rolling walker Ambulation/Gait Assistance Details: cues for position from RW, stride length, sequence, and posture Gait Pattern: Step-to pattern    Exercises     PT Diagnosis:    PT Problem List:   PT Treatment Interventions:     PT Goals Acute Rehab PT Goals PT Goal Formulation: With patient Time For Goal Achievement: 06/13/12 Potential to Achieve Goals: Good Pt will go Supine/Side to Sit: with supervision PT Goal: Supine/Side to Sit - Progress: Goal set today Pt will go Sit to Supine/Side: with supervision PT Goal: Sit to Supine/Side - Progress: Goal set today Pt will go Sit to Stand: with supervision PT Goal: Sit to Stand - Progress: Progressing toward goal Pt will go Stand to Sit: with supervision PT Goal: Stand to Sit - Progress: Progressing toward  goal Pt will Ambulate: 51 - 150 feet;with supervision;with rolling walker PT Goal: Ambulate - Progress: Progressing toward goal Pt will Go Up / Down Stairs: 1-2 stairs;with min assist;with least restrictive assistive device PT Goal: Up/Down Stairs - Progress: Goal set today  Visit Information  Last PT Received On: 06/10/12 Assistance Needed: +1    Subjective Data  Subjective: I'm doing ok Patient Stated Goal: Resume previous lifestyle with decreased pain   Cognition  Overall Cognitive Status: Appears within functional limits for tasks assessed/performed Arousal/Alertness: Awake/alert Orientation Level: Appears intact for tasks assessed Behavior During Session: [facility] for tasks performed    Balance     End of Session PT - End of Session Equipment Utilized During Treatment: Left knee immobilizer Activity Tolerance: Patient tolerated treatment well Patient left: Other (comment) (in bathroom with OT) Nurse Communication: Mobility status   GP     Kaitlin Arnold 06/10/2012, 3:23 PM

## 2012-06-10 NOTE — Evaluation (Signed)
Physical Therapy Evaluation Patient Details Name: Kaitlin Arnold MRN: [id number] DOB: 1936-09-21 Today's Date: 06/10/2012 Time: 0805-0826 PT Time Calculation (min): 21 min  PT Assessment / Plan / Recommendation Clinical Impression  Pt s/p L TKR presents with decreased L LE strength/ROM and limitations in functional mobility    PT Assessment  Patient needs continued PT services    Follow Up Recommendations  Home health PT    Barriers to Discharge        Equipment Recommendations  None recommended by PT    Recommendations for Other Services OT consult   Frequency 7X/week    Precautions / Restrictions Precautions Precautions: Knee Required Braces or Orthoses: Knee Immobilizer - Left Restrictions Weight Bearing Restrictions: No Other Position/Activity Restrictions: WBAT   Pertinent Vitals/Pain 5/10; pt premedicated, cold packs provided      Mobility  Bed Mobility Bed Mobility:  (OOB deferred at pt request 2* to arrival of breakfast)    Exercises     PT Diagnosis: Difficulty walking  PT Problem List: Decreased strength;Decreased range of motion;Decreased activity tolerance;Decreased mobility;Decreased knowledge of use of DME;Pain PT Treatment Interventions: DME instruction;Gait training;Stair training;Functional mobility training;Therapeutic activities;Therapeutic exercise;Patient/family education   PT Goals Acute Rehab PT Goals PT Goal Formulation: With patient Time For Goal Achievement: 06/13/12 Potential to Achieve Goals: Good Pt will go Supine/Side to Sit: with supervision PT Goal: Supine/Side to Sit - Progress: Goal set today Pt will go Sit to Supine/Side: with supervision PT Goal: Sit to Supine/Side - Progress: Goal set today Pt will go Sit to Stand: with supervision PT Goal: Sit to Stand - Progress: Goal set today Pt will go Stand to Sit: with supervision PT Goal: Stand to Sit - Progress: Goal set today Pt will Ambulate: 51 - 150 feet;with supervision;with  rolling walker PT Goal: Ambulate - Progress: Goal set today Pt will Go Up / Down Stairs: 1-2 stairs;with min assist;with least restrictive assistive device PT Goal: Up/Down Stairs - Progress: Goal set today  Visit Information  Last PT Received On: 06/10/12 Assistance Needed: +1    Subjective Data  Subjective: I had the other knee done in the late 90s Patient Stated Goal: Resume previous lifestyle with decreased pain   Prior Functioning  Home Living Lives With: Alone Available Help at Discharge: Family Type of Home: House Home Access: Stairs to enter Secretary/administrator of Steps: 2 Entrance Stairs-Rails: None Home Layout: Able to live on main level with bedroom/bathroom Home Adaptive Equipment: Walker - rolling Prior Function Level of Independence: Independent Able to Take Stairs?: Yes Driving: Yes Vocation: Retired Musician: No difficulties    Cognition  Overall Cognitive Status: Appears within functional limits for tasks assessed/performed Arousal/Alertness: Awake/alert Orientation Level: Appears intact for tasks assessed Behavior During Session: [facility] for tasks performed    Extremity/Trunk Assessment Right Upper Extremity Assessment RUE ROM/Strength/Tone: [facility] for tasks assessed Left Upper Extremity Assessment LUE ROM/Strength/Tone: WFL for tasks assessed Right Lower Extremity Assessment RLE ROM/Strength/Tone: [facility] for tasks assessed Left Lower Extremity Assessment LLE ROM/Strength/Tone: Deficits LLE ROM/Strength/Tone Deficits: Quads 2+/5 with aarom -10 - 50   Balance    End of Session    GP     Micalah Cabezas 06/10/2012, 11:59 AM

## 2012-06-10 NOTE — Evaluation (Signed)
Occupational Therapy Evaluation Patient Details Name: Kaitlin Arnold MRN: [id number] DOB: 10-15-1935 Today's Date: 06/10/2012 Time: 4098-1191 OT Time Calculation (min): 14 min  OT Assessment / Plan / Recommendation Clinical Impression  This 76 year old female was admitted for L TKA.  At baseline, she is independent with ADLs.  She will have daughters stay with her initially and OT will follow her in acute for bathroom transfers to reinforce safe transfers in preparation for discharge home.      OT Assessment  Patient needs continued OT Services    Follow Up Recommendations  No OT follow up    Barriers to Discharge      Equipment Recommendations  None recommended by OT;None recommended by PT    Recommendations for Other Services    Frequency  Min 2X/week    Precautions / Restrictions Precautions Precautions: Knee Required Braces or Orthoses: Knee Immobilizer - Left Knee Immobilizer - Left: Discontinue once straight leg raise with < 10 degree lag Restrictions Weight Bearing Restrictions: No Other Position/Activity Restrictions: WBAT   Pertinent Vitals/Pain 2/10 L knee; ice reapplied and repositioned    ADL  Grooming: Performed;Min guard Where Assessed - Grooming: Supported standing Toilet Transfer: Performed;Minimal Dentist Method: Sit to Barista: Raised toilet seat with arms (or 3-in-1 over toilet) (ambulated to bathroom) Toileting - Clothing Manipulation and Hygiene: Performed;Min guard Where Assessed - Engineer, mining and Hygiene: Sit to stand from 3-in-1 or toilet Tub/Shower Transfer:  (not ready yet) Transfers/Ambulation Related to ADLs: min A to ambulate to bathroom; took over from PT.   ADL Comments: Pt will have daughters help with ADLs.  Not interested in AE    OT Diagnosis: Generalized weakness  OT Problem List: Decreased strength;Decreased activity tolerance;Decreased knowledge of use of DME or AE OT  Treatment Interventions: Self-care/ADL training;DME and/or AE instruction;Patient/family education   OT Goals Acute Rehab OT Goals OT Goal Formulation: With patient Time For Goal Achievement: 06/17/12 Potential to Achieve Goals: Good ADL Goals Pt Will Transfer to Toilet: with supervision;Ambulation;3-in-1 ADL Goal: Toilet Transfer - Progress: Goal set today Pt Will Perform Tub/Shower Transfer: Shower transfer;with min assist;Ambulation;Shower seat without back (min guard) ADL Goal: Web designer - Progress: Goal set today Miscellaneous OT Goals Miscellaneous OT Goal #1: pt will complete all aspects of toileting with supervision, sit to stand OT Goal: Miscellaneous Goal #1 - Progress: Goal set today  Visit Information  Last OT Received On: 06/10/12 Assistance Needed: +1    Subjective Data  Subjective: I don't know if I have to go to the bathroom yet Patient Stated Goal: home with daughters' assistance   Prior Functioning  Vision/Perception  Home Living Lives With: Alone Available Help at Discharge: Family (daughters will take turns staying with her initially) Bathroom Shower/Tub: Health visitor: Standard (3:1) Home Adaptive Equipment: Environmental consultant - rolling Prior Function Level of Independence: Independent Communication Communication: No difficulties      Cognition  Overall Cognitive Status: Appears within functional limits for tasks assessed/performed Arousal/Alertness: Awake/alert Orientation Level: Appears intact for tasks assessed Behavior During Session: [facility] for tasks performed    Extremity/Trunk Assessment Right Upper Extremity Assessment RUE ROM/Strength/Tone: Within functional levels Left Upper Extremity Assessment LUE ROM/Strength/Tone: Within functional levels   Mobility  Shoulder Instructions  Transfers Sit to Stand: 4: Min assist Stand to Sit: 4: Min assist Details for Transfer Assistance: min cues for hand placement and to extend LLE         Exercise  Balance     End of Session OT - End of Session Activity Tolerance: Patient limited by fatigue Patient left: in chair;with call bell/phone within reach  GO     [facility] 06/10/2012, 3:38 PM Marica Otter, OTR/L [phone] 06/10/2012

## 2012-06-10 NOTE — Progress Notes (Signed)
   Subjective: 1 Day Post-Op Procedure(s) (LRB): TOTAL KNEE ARTHROPLASTY (Left)   Patient reports pain as mild, pain is moderately controlled. Concerned that there is blood in the urine, otherwise no events throughout the night.   Objective:   VITALS:   Filed Vitals:   06/10/12 1000  BP: 112/63  Pulse: 66  Temp: 98 F (36.7 C)  Resp: 15    Lung and heart sounds are clear Neurovascular intact Dorsiflexion/Plantar flexion intact Incision: dressing C/D/I No cellulitis present Compartment soft  LABS  Basename 06/10/12 0355  HGB 11.9*  HCT 35.4*  WBC 13.0*  PLT 294     Basename 06/10/12 0355  NA 133*  K 4.0  BUN 18  CREATININE 0.77  GLUCOSE 140*     Assessment/Plan: 1 Day Post-Op Procedure(s) (LRB): TOTAL KNEE ARTHROPLASTY (Left) HV drain d/c'ed Foley cath d/c'ed Advance diet Up with therapy D/C IV fluids Plan for discharge tomorrow if she continues to do well.   ABLA  Treated with iron and will observe  Hematuria Most likely trauma from insertion of foley cath Will obtain UA with culture to test for infection etiology   Anastasio Auerbach. Shaida Route   PAC  06/10/2012, 11:47 AM

## 2012-06-11 ENCOUNTER — Encounter (HOSPITAL_COMMUNITY): Payer: Self-pay | Admitting: Orthopedic Surgery

## 2012-06-11 LAB — CBC
MCH: 27.9 pg (ref 26.0–34.0)
MCHC: 34.3 g/dL (ref 30.0–36.0)
MCV: 81.5 fL (ref 78.0–100.0)
Platelets: 274 10*3/uL (ref 150–400)
RBC: 4.01 MIL/uL (ref 3.87–5.11)

## 2012-06-11 LAB — URINE CULTURE
Colony Count: NO GROWTH
Culture: NO GROWTH

## 2012-06-11 LAB — BASIC METABOLIC PANEL
CO2: 24 mEq/L (ref 19–32)
Calcium: 8.5 mg/dL (ref 8.4–10.5)
Creatinine, Ser: 0.68 mg/dL (ref 0.50–1.10)
GFR calc non Af Amer: 84 mL/min — ABNORMAL LOW (ref >90)

## 2012-06-11 MED ORDER — OXYCODONE HCL 5 MG PO TABS: 5.0000 mg | ORAL_TABLET | ORAL | Status: AC | PRN

## 2012-06-11 MED ORDER — ASPIRIN EC 325 MG PO TBEC: 325.0000 mg | DELAYED_RELEASE_TABLET | Freq: Two times a day (BID) | ORAL | Status: AC

## 2012-06-11 MED ORDER — FERROUS SULFATE 325 (65 FE) MG PO TABS: 325.0000 mg | ORAL_TABLET | Freq: Three times a day (TID) | ORAL | Status: DC

## 2012-06-11 MED ORDER — DIPHENHYDRAMINE HCL 25 MG PO CAPS: 25.0000 mg | ORAL_CAPSULE | Freq: Four times a day (QID) | ORAL | Status: DC | PRN

## 2012-06-11 MED ORDER — DSS 100 MG PO CAPS: 100.0000 mg | ORAL_CAPSULE | Freq: Two times a day (BID) | ORAL | Status: AC

## 2012-06-11 MED ORDER — POLYETHYLENE GLYCOL 3350 17 G PO PACK: 17.0000 g | PACK | Freq: Two times a day (BID) | ORAL | Status: AC

## 2012-06-11 MED ORDER — CIPROFLOXACIN HCL 500 MG PO TABS: 500.0000 mg | ORAL_TABLET | Freq: Two times a day (BID) | ORAL | Status: AC

## 2012-06-11 MED ORDER — METHOCARBAMOL 500 MG PO TABS: 500.0000 mg | ORAL_TABLET | Freq: Four times a day (QID) | ORAL | Status: AC | PRN

## 2012-06-11 MED ORDER — OXYCODONE HCL 5 MG PO TABS: 5.0000 mg | ORAL_TABLET | ORAL | Status: DC | PRN

## 2012-06-11 MED ORDER — SULFAMETHOXAZOLE-TMP DS 800-160 MG PO TABS: 1.0000 | ORAL_TABLET | Freq: Two times a day (BID) | ORAL | Status: DC

## 2012-06-11 MED ORDER — CIPROFLOXACIN HCL 500 MG PO TABS
500.0000 mg | ORAL_TABLET | Freq: Two times a day (BID) | ORAL | Status: DC
  Filled 2012-06-11 (×3): qty 1

## 2012-06-11 NOTE — Discharge Summary (Signed)
Physician Discharge Summary  Patient ID: Kaitlin Arnold MRN: [id number] DOB/AGE: 74-May-1937 74 y.o.  Admit date: 06/09/2012 Discharge date:  06/11/2012  Procedures:  Procedure(s) (LRB): TOTAL KNEE ARTHROPLASTY (Left)  Attending Physician:  Dr. Durene Romans   Admission Diagnoses:    Left knee OA and pain   Discharge Diagnoses:  Principal Problem:  *S/P left TKA Active Problems:  Expected blood loss anemia  UTI (urinary tract infection) Hypertension   Hyperlipidemia   H/O cardiovascular stress test   Shortness of breath   GERD (gastroesophageal reflux disease)   Arthritis  HPI: Pt is a 76 y.o. female complaining of left knee pain for years, significantly increasing over the last year. Pain had continually increased since the beginning. X-rays in the clinic show end-stage arthritic changes of the left knee. Pt has tried various conservative treatments which have failed to alleviate their symptoms, including NSAIDs and steroid injections. Various options are discussed with the patient. Risks, benefits and expectations were discussed with the patient. Patient understand the risks, benefits and expectations and wishes to proceed with surgery.  PCP: Alva Garnet., MD   Discharged Condition: good  Hospital Course:  Patient underwent the above stated procedure on 06/09/2012. Patient tolerated the procedure well and brought to the recovery room in good condition and subsequently to the floor.  POD #1 BP: 112/63 ; Pulse: 66 ; Temp: 98 F (36.7 C) ; Resp: 15 Pt's foley was removed, as well as the hemovac drain removed. IV was changed to a saline lock. Lung and heart sounds are clear, neurovascular intact, dorsiflexion/plantar flexion intact, incision: dressing C/D/I, no cellulitis present and compartment soft. UA obtained showing UTI, placed on antibiotics.  LABS  Basename  06/10/12 0355   HGB  11.9  HCT  35.4   POD #2  BP: 149/79 ; Pulse: 77 ; Temp: 97.7 F (36.5 C) ;  Resp: 16 Patient reports pain as mild, pain well controlled. No events throughout the night. Feels that she had a good night and slept well. Neurovascular intact, dorsiflexion/plantar flexion intact, incision: dressing C/D/I, no cellulitis present and compartment soft.   LABS  Basename  06/11/12 0421  HGB  11.2  HCT  32.7    Discharge Exam: General appearance: alert, cooperative and no distress Extremities: Homans sign is negative, no sign of DVT, no edema, redness or tenderness in the calves or thighs and no ulcers, gangrene or trophic changes  Disposition: Home  with follow up in 2 weeks   Follow-up Information    Follow up with Shelda Pal, MD in 2 weeks.   Contact information:   [facility] [address], [address] [place] [address] [phone]          Discharge Orders    Future Orders Please Complete By Expires   Diet - low sodium heart healthy      Call MD / Call 911      Comments:   If you experience chest pain or shortness of breath, CALL 911 and be transported to the hospital emergency room.  If you develope a fever above 101 F, pus (white drainage) or increased drainage or redness at the wound, or calf pain, call your surgeon's office.   Discharge instructions      Comments:   Maintain surgical dressing for 8 days, then replace with gauze and tape. Keep the area dry and clean until follow up. Follow up in 2 weeks at [facility]. Call with any questions or concerns.  Home CPM machine, 4-6 hours a day. Start 0-60 degrees as tolerated, advance 10-15 degrees a day as tolerated to max.   Constipation Prevention      Comments:   Drink plenty of fluids.  Prune juice may be helpful.  You may use a stool softener, such as Colace (over the counter) 100 mg twice a day.  Use MiraLax (over the counter) for constipation as needed.   Increase activity slowly as tolerated      TED hose      Comments:   Use stockings (TED  hose) for 2 weeks on both leg(s).  You may remove them at night for sleeping.   Change dressing      Comments:   Maintain surgical dressing for 8 days, then change the dressing daily with sterile 4 x 4 inch gauze dressing and tape. Keep the area dry and clean.      Current Discharge Medication List    START taking these medications   Details  aspirin EC 325 MG tablet Take 1 tablet (325 mg total) by mouth 2 (two) times daily. X 4 weeks Qty: 60 tablet, Refills: 0    ciprofloxacin (CIPRO) 500 MG tablet Take 1 tablet (500 mg total) by mouth 2 (two) times daily. Qty: 6 tablet, Refills: 0    diphenhydrAMINE (BENADRYL) 25 mg capsule Take 1 capsule (25 mg total) by mouth every 6 (six) hours as needed for itching, allergies or sleep. Qty: 30 capsule    docusate sodium 100 MG CAPS Take 100 mg by mouth 2 (two) times daily. Qty: 10 capsule    ferrous sulfate 325 (65 FE) MG tablet Take 1 tablet (325 mg total) by mouth 3 (three) times daily after meals.    methocarbamol (ROBAXIN) 500 MG tablet Take 1 tablet (500 mg total) by mouth every 6 (six) hours as needed (muscle spasms).    oxyCODONE (OXY IR/ROXICODONE) 5 MG immediate release tablet Take 1-2 tablets (5-10 mg total) by mouth every 4 (four) hours as needed for pain. Qty: 120 tablet, Refills: 0    polyethylene glycol (MIRALAX / GLYCOLAX) packet Take 17 g by mouth 2 (two) times daily. Qty: 14 each      CONTINUE these medications which have NOT CHANGED   Details  amLODipine (NORVASC) 5 MG tablet Take 5 mg by mouth every morning.    losartan (COZAAR) 25 MG tablet Take 25 mg by mouth at bedtime.    losartan-hydrochlorothiazide (HYZAAR) 100-25 MG per tablet Take 1 tablet by mouth daily. Takes in afternoon    simvastatin (ZOCOR) 20 MG tablet Take 20 mg by mouth every evening.      STOP taking these medications     aspirin 81 MG tablet Comments:  Reason for Stopping:       Cholecalciferol (VITAMIN D3) 2000 UNITS TABS Comments:    Reason for Stopping:       ibuprofen (ADVIL,MOTRIN) 200 MG tablet Comments:  Reason for Stopping:       nabumetone (RELAFEN) 500 MG tablet Comments:  Reason for Stopping:           Signed: Anastasio Auerbach. Alexander Aument   PAC  06/11/2012, 11:25 AM

## 2012-06-11 NOTE — Progress Notes (Signed)
Pt chose [facility] to provide HHPT services. She stated she already has all the DME she needs.

## 2012-06-11 NOTE — Progress Notes (Signed)
Physical Therapy Treatment Patient Details Name: CHANDELL ATTRIDGE MRN: [id number] DOB: [birth year] Today's Date: 06/11/2012 Time: 0950-1015 PT Time Calculation (min): 25 min  PT Assessment / Plan / Recommendation Comments on Treatment Session  Daughter present, so educated on all below.    Follow Up Recommendations  Home health PT    Barriers to Discharge        Equipment Recommendations  None recommended by PT;None recommended by OT    Recommendations for Other Services    Frequency 7X/week   Plan Discharge plan remains appropriate    Precautions / Restrictions Precautions Precautions: Knee Precaution Comments: pt able to perform 10 active SLR so instructed to no longer wear KI for amb Restrictions Weight Bearing Restrictions: No Other Position/Activity Restrictions: WBAT    Pertinent Vitals/Pain C/o "soreness'    Mobility  Bed Mobility Bed Mobility: Not assessed Details for Bed Mobility Assistance: Pt OOB in recliner  Transfers Transfers: Sit to Stand;Stand to Sit Sit to Stand: 5: Supervision;From chair/3-in-1 Stand to Sit: 5: Supervision;To chair/3-in-1 Details for Transfer Assistance: good use of hands and safety cognition   Ambulation/Gait Ambulation/Gait Assistance: 5: Supervision Ambulation Distance (Feet): 85 Feet Assistive device: Rolling walker Ambulation/Gait Assistance Details: one VC on safety negociating around obsticles Gait Pattern: Step-to pattern;Decreased stance time - left;Decreased stride length Gait velocity: decreased  Stairs: Yes Stairs Assistance: 4: Min assist Stairs Assistance Details (indicate cue type and reason): with daughter present and handout also given Stair Management Technique: No rails;With walker Number of Stairs: 4      PT Goals                      progressing    Visit Information  Last PT Received On: 06/11/12 Assistance Needed: +1    Subjective Data  Subjective: Daughter present and instructed on HEP, use of KI  for sleeping, proper positioning LE in extension, stairs and safety with gait using RW. Patient Stated Goal: home             End of Session PT - End of Session Equipment Utilized During Treatment: Gait belt Activity Tolerance: Patient tolerated treatment well Patient left: in chair;with call bell/phone within reach;with family/visitor present Nurse Communication: Other (comment) (Pt ready for D/C to home)   Felecia Shelling  PTA [facility]  Acute  Rehab Pager     [phone]

## 2012-06-11 NOTE — Progress Notes (Signed)
   Subjective: 2 Days Post-Op Procedure(s) (LRB): TOTAL KNEE ARTHROPLASTY (Left)   Patient reports pain as mild, pain well controlled. No events throughout the night. Feels that she had a good night and slept well.  Objective:   VITALS:   Filed Vitals:   06/11/12 0800  BP: 149/79  Pulse: 77  Temp: 97.7 F (36.5 C)   Resp: 16    Neurovascular intact Dorsiflexion/Plantar flexion intact Incision: dressing C/D/I No cellulitis present Compartment soft  LABS  Basename 06/11/12 0421 06/10/12 0355  HGB 11.2* 11.9*  HCT 32.7* 35.4*  WBC 11.8* 13.0*  PLT 274 294     Basename 06/11/12 0421 06/10/12 0355  NA 133* 133*  K 3.9 4.0  BUN 21 18  CREATININE 0.68 0.77  GLUCOSE 144* 140*     Assessment/Plan: 2 Days Post-Op Procedure(s) (LRB): TOTAL KNEE ARTHROPLASTY (Left) Up with therapy Discharge home with home health Follow up in 2 weeks at [facility] Follow-up Information    Follow up with OLIN,Arlen Legendre D in 2 weeks.   Contact information:   [facility] [address] [place] [address] [phone]         ABLA  Treated with iron and will observe  UTI Treatment with Bactrim DS for 7 days    Anastasio Auerbach. Sarabella Caprio   PAC  06/11/2012, 10:04 AM

## 2012-06-11 NOTE — Progress Notes (Signed)
Physical Therapy Treatment Patient Details Name: Kaitlin Arnold MRN: [id number] DOB: 17-Dec-1935 Today's Date: 06/11/2012 Time: 0850-0920 PT Time Calculation (min): 30 min  PT Assessment / Plan / Recommendation Comments on Treatment Session  Pt amb out of bathroom so assisted with amb in hallway to practice steps.    Follow Up Recommendations  Home health PT    Barriers to Discharge        Equipment Recommendations  None recommended by PT;None recommended by OT    Recommendations for Other Services    Frequency 7X/week   Plan Discharge plan remains appropriate    Precautions / Restrictions Precautions Precautions: Knee Precaution Comments: pt able to perform 10 active SLR so instructed to no longer wear KI for amb Restrictions Weight Bearing Restrictions: No Other Position/Activity Restrictions: WBAT    Pertinent Vitals/Pain C/o "soreness"    Mobility  Bed Mobility Bed Mobility: Not assessed Details for Bed Mobility Assistance: Pt OOB in bathroom  Transfers Transfers: Sit to Stand;Stand to Sit Sit to Stand: 5: Supervision;From toilet Stand to Sit: 5: Supervision;To chair/3-in-1 Details for Transfer Assistance: good use of hands and safety cognition  Ambulation/Gait Ambulation/Gait Assistance: 5: Supervision;4: Min guard Ambulation Distance (Feet): 55 Feet Assistive device: Rolling walker Ambulation/Gait Assistance Details: one VC on safety with turns and backward gait sequencing Gait Pattern: Step-to pattern;Decreased stance time - left;Decreased stride length;Trunk flexed Gait velocity: decreased  Stairs: Yes Stairs Assistance: 4: Min assist Stairs Assistance Details (indicate cue type and reason): 50% VC's on proper sequencing and safety Stair Management Technique: No rails;With walker Number of Stairs: 2      PT Goals         progressing    Visit Information  Last PT Received On: 06/11/12 Assistance Needed: +1                   End of Session PT  - End of Session Equipment Utilized During Treatment: Gait belt Activity Tolerance: Patient tolerated treatment well Patient left: in chair;with call bell/phone within reach   Felecia Shelling  PTA [facility]  Acute  Rehab Pager     [phone]

## 2012-06-11 NOTE — Progress Notes (Signed)
Pt for d/c home today with [facility] PT & CPM to be delivered to her house. IV d/c'd. Dressing CDI to L knee. Dressing supplies provided for home use. D/C instructions & RX given with verbalized understanding. Dau at bedside to assist with d/c.

## 2012-06-12 DIAGNOSIS — IMO0001 Reserved for inherently not codable concepts without codable children: Secondary | ICD-10-CM | POA: Diagnosis not present

## 2012-06-12 DIAGNOSIS — Z96659 Presence of unspecified artificial knee joint: Secondary | ICD-10-CM | POA: Diagnosis not present

## 2012-06-12 DIAGNOSIS — Z471 Aftercare following joint replacement surgery: Secondary | ICD-10-CM | POA: Diagnosis not present

## 2012-06-12 DIAGNOSIS — M1710 Unilateral primary osteoarthritis, unspecified knee: Secondary | ICD-10-CM | POA: Diagnosis not present

## 2012-06-12 DIAGNOSIS — F17200 Nicotine dependence, unspecified, uncomplicated: Secondary | ICD-10-CM | POA: Diagnosis not present

## 2012-06-12 DIAGNOSIS — I10 Essential (primary) hypertension: Secondary | ICD-10-CM | POA: Diagnosis not present

## 2012-07-06 DIAGNOSIS — M1710 Unilateral primary osteoarthritis, unspecified knee: Secondary | ICD-10-CM | POA: Diagnosis not present

## 2012-07-09 DIAGNOSIS — M1710 Unilateral primary osteoarthritis, unspecified knee: Secondary | ICD-10-CM | POA: Diagnosis not present

## 2012-07-13 DIAGNOSIS — M1710 Unilateral primary osteoarthritis, unspecified knee: Secondary | ICD-10-CM | POA: Diagnosis not present

## 2012-07-16 DIAGNOSIS — M1710 Unilateral primary osteoarthritis, unspecified knee: Secondary | ICD-10-CM | POA: Diagnosis not present

## 2012-07-20 DIAGNOSIS — M1710 Unilateral primary osteoarthritis, unspecified knee: Secondary | ICD-10-CM | POA: Diagnosis not present

## 2012-07-23 DIAGNOSIS — M1710 Unilateral primary osteoarthritis, unspecified knee: Secondary | ICD-10-CM | POA: Diagnosis not present

## 2012-07-27 DIAGNOSIS — M1710 Unilateral primary osteoarthritis, unspecified knee: Secondary | ICD-10-CM | POA: Diagnosis not present

## 2012-07-29 DIAGNOSIS — Z96659 Presence of unspecified artificial knee joint: Secondary | ICD-10-CM | POA: Diagnosis not present

## 2012-08-04 DIAGNOSIS — I10 Essential (primary) hypertension: Secondary | ICD-10-CM | POA: Diagnosis not present

## 2012-08-04 DIAGNOSIS — E785 Hyperlipidemia, unspecified: Secondary | ICD-10-CM | POA: Diagnosis not present

## 2012-08-04 DIAGNOSIS — Z79899 Other long term (current) drug therapy: Secondary | ICD-10-CM | POA: Diagnosis not present

## 2012-08-04 DIAGNOSIS — R109 Unspecified abdominal pain: Secondary | ICD-10-CM | POA: Diagnosis not present

## 2012-08-04 DIAGNOSIS — E559 Vitamin D deficiency, unspecified: Secondary | ICD-10-CM | POA: Diagnosis not present

## 2012-08-04 DIAGNOSIS — R5381 Other malaise: Secondary | ICD-10-CM | POA: Diagnosis not present

## 2012-08-19 DIAGNOSIS — Z1231 Encounter for screening mammogram for malignant neoplasm of breast: Secondary | ICD-10-CM | POA: Diagnosis not present

## 2012-08-24 DIAGNOSIS — Z23 Encounter for immunization: Secondary | ICD-10-CM | POA: Diagnosis not present

## 2012-08-24 DIAGNOSIS — E559 Vitamin D deficiency, unspecified: Secondary | ICD-10-CM | POA: Diagnosis not present

## 2012-08-24 DIAGNOSIS — Z0000 Encounter for general adult medical examination without abnormal findings: Secondary | ICD-10-CM | POA: Diagnosis not present

## 2012-08-24 DIAGNOSIS — I10 Essential (primary) hypertension: Secondary | ICD-10-CM | POA: Diagnosis not present

## 2012-08-24 DIAGNOSIS — E785 Hyperlipidemia, unspecified: Secondary | ICD-10-CM | POA: Diagnosis not present

## 2012-08-24 DIAGNOSIS — Z1212 Encounter for screening for malignant neoplasm of rectum: Secondary | ICD-10-CM | POA: Diagnosis not present

## 2013-01-26 DIAGNOSIS — I10 Essential (primary) hypertension: Secondary | ICD-10-CM | POA: Diagnosis not present

## 2013-01-26 DIAGNOSIS — Z79899 Other long term (current) drug therapy: Secondary | ICD-10-CM | POA: Diagnosis not present

## 2013-01-26 DIAGNOSIS — E785 Hyperlipidemia, unspecified: Secondary | ICD-10-CM | POA: Diagnosis not present

## 2013-01-26 DIAGNOSIS — R7309 Other abnormal glucose: Secondary | ICD-10-CM | POA: Diagnosis not present

## 2013-04-20 DIAGNOSIS — E669 Obesity, unspecified: Secondary | ICD-10-CM | POA: Diagnosis not present

## 2013-04-20 DIAGNOSIS — Z800 Family history of malignant neoplasm of digestive organs: Secondary | ICD-10-CM | POA: Diagnosis not present

## 2013-04-20 DIAGNOSIS — Z86010 Personal history of colonic polyps: Secondary | ICD-10-CM | POA: Diagnosis not present

## 2013-04-20 DIAGNOSIS — K5730 Diverticulosis of large intestine without perforation or abscess without bleeding: Secondary | ICD-10-CM | POA: Diagnosis not present

## 2013-04-20 DIAGNOSIS — Z1211 Encounter for screening for malignant neoplasm of colon: Secondary | ICD-10-CM | POA: Diagnosis not present

## 2013-04-20 DIAGNOSIS — K219 Gastro-esophageal reflux disease without esophagitis: Secondary | ICD-10-CM | POA: Diagnosis not present

## 2013-06-25 DIAGNOSIS — Z800 Family history of malignant neoplasm of digestive organs: Secondary | ICD-10-CM | POA: Diagnosis not present

## 2013-06-25 DIAGNOSIS — K621 Rectal polyp: Secondary | ICD-10-CM | POA: Diagnosis not present

## 2013-06-25 DIAGNOSIS — K6389 Other specified diseases of intestine: Secondary | ICD-10-CM | POA: Diagnosis not present

## 2013-06-25 DIAGNOSIS — K620 Anal polyp: Secondary | ICD-10-CM | POA: Diagnosis not present

## 2013-06-25 DIAGNOSIS — D128 Benign neoplasm of rectum: Secondary | ICD-10-CM | POA: Diagnosis not present

## 2013-06-25 DIAGNOSIS — D126 Benign neoplasm of colon, unspecified: Secondary | ICD-10-CM | POA: Diagnosis not present

## 2013-06-25 DIAGNOSIS — Z1211 Encounter for screening for malignant neoplasm of colon: Secondary | ICD-10-CM | POA: Diagnosis not present

## 2013-07-02 DIAGNOSIS — Z96659 Presence of unspecified artificial knee joint: Secondary | ICD-10-CM | POA: Diagnosis not present

## 2013-08-23 DIAGNOSIS — Z1231 Encounter for screening mammogram for malignant neoplasm of breast: Secondary | ICD-10-CM | POA: Diagnosis not present

## 2013-09-23 DIAGNOSIS — R7309 Other abnormal glucose: Secondary | ICD-10-CM | POA: Diagnosis not present

## 2013-09-23 DIAGNOSIS — Z0000 Encounter for general adult medical examination without abnormal findings: Secondary | ICD-10-CM | POA: Diagnosis not present

## 2013-09-23 DIAGNOSIS — Z79899 Other long term (current) drug therapy: Secondary | ICD-10-CM | POA: Diagnosis not present

## 2013-09-23 DIAGNOSIS — E559 Vitamin D deficiency, unspecified: Secondary | ICD-10-CM | POA: Diagnosis not present

## 2013-09-23 DIAGNOSIS — I10 Essential (primary) hypertension: Secondary | ICD-10-CM | POA: Diagnosis not present

## 2013-09-23 DIAGNOSIS — E785 Hyperlipidemia, unspecified: Secondary | ICD-10-CM | POA: Diagnosis not present

## 2013-09-23 DIAGNOSIS — Z23 Encounter for immunization: Secondary | ICD-10-CM | POA: Diagnosis not present

## 2013-12-30 DIAGNOSIS — H2510 Age-related nuclear cataract, unspecified eye: Secondary | ICD-10-CM | POA: Diagnosis not present

## 2013-12-30 DIAGNOSIS — H02059 Trichiasis without entropian unspecified eye, unspecified eyelid: Secondary | ICD-10-CM | POA: Diagnosis not present

## 2013-12-30 DIAGNOSIS — H25019 Cortical age-related cataract, unspecified eye: Secondary | ICD-10-CM | POA: Diagnosis not present

## 2013-12-30 DIAGNOSIS — H40009 Preglaucoma, unspecified, unspecified eye: Secondary | ICD-10-CM | POA: Diagnosis not present

## 2014-03-30 DIAGNOSIS — E119 Type 2 diabetes mellitus without complications: Secondary | ICD-10-CM | POA: Diagnosis not present

## 2014-03-30 DIAGNOSIS — E785 Hyperlipidemia, unspecified: Secondary | ICD-10-CM | POA: Diagnosis not present

## 2014-03-30 DIAGNOSIS — E8881 Metabolic syndrome: Secondary | ICD-10-CM | POA: Diagnosis not present

## 2014-03-30 DIAGNOSIS — I10 Essential (primary) hypertension: Secondary | ICD-10-CM | POA: Diagnosis not present

## 2014-03-30 DIAGNOSIS — E559 Vitamin D deficiency, unspecified: Secondary | ICD-10-CM | POA: Diagnosis not present

## 2014-03-30 DIAGNOSIS — E039 Hypothyroidism, unspecified: Secondary | ICD-10-CM | POA: Diagnosis not present

## 2014-03-30 DIAGNOSIS — E669 Obesity, unspecified: Secondary | ICD-10-CM | POA: Diagnosis not present

## 2014-08-25 DIAGNOSIS — Z23 Encounter for immunization: Secondary | ICD-10-CM | POA: Diagnosis not present

## 2014-09-13 DIAGNOSIS — Z1231 Encounter for screening mammogram for malignant neoplasm of breast: Secondary | ICD-10-CM | POA: Diagnosis not present

## 2014-10-12 DIAGNOSIS — Z0000 Encounter for general adult medical examination without abnormal findings: Secondary | ICD-10-CM | POA: Diagnosis not present

## 2014-10-12 DIAGNOSIS — I10 Essential (primary) hypertension: Secondary | ICD-10-CM | POA: Diagnosis not present

## 2014-10-12 DIAGNOSIS — E784 Other hyperlipidemia: Secondary | ICD-10-CM | POA: Diagnosis not present

## 2014-10-12 DIAGNOSIS — E039 Hypothyroidism, unspecified: Secondary | ICD-10-CM | POA: Diagnosis not present

## 2014-10-12 DIAGNOSIS — Z79899 Other long term (current) drug therapy: Secondary | ICD-10-CM | POA: Diagnosis not present

## 2014-11-30 DIAGNOSIS — I10 Essential (primary) hypertension: Secondary | ICD-10-CM | POA: Diagnosis not present

## 2014-11-30 DIAGNOSIS — E782 Mixed hyperlipidemia: Secondary | ICD-10-CM | POA: Diagnosis not present

## 2014-11-30 DIAGNOSIS — R5383 Other fatigue: Secondary | ICD-10-CM | POA: Diagnosis not present

## 2014-12-28 DIAGNOSIS — I10 Essential (primary) hypertension: Secondary | ICD-10-CM | POA: Diagnosis not present

## 2014-12-28 DIAGNOSIS — Z0000 Encounter for general adult medical examination without abnormal findings: Secondary | ICD-10-CM | POA: Diagnosis not present

## 2014-12-28 DIAGNOSIS — R5383 Other fatigue: Secondary | ICD-10-CM | POA: Diagnosis not present

## 2014-12-28 DIAGNOSIS — E782 Mixed hyperlipidemia: Secondary | ICD-10-CM | POA: Diagnosis not present

## 2014-12-29 DIAGNOSIS — H43812 Vitreous degeneration, left eye: Secondary | ICD-10-CM | POA: Diagnosis not present

## 2014-12-29 DIAGNOSIS — H3589 Other specified retinal disorders: Secondary | ICD-10-CM | POA: Diagnosis not present

## 2014-12-29 DIAGNOSIS — H43811 Vitreous degeneration, right eye: Secondary | ICD-10-CM | POA: Diagnosis not present

## 2014-12-29 DIAGNOSIS — H40012 Open angle with borderline findings, low risk, left eye: Secondary | ICD-10-CM | POA: Diagnosis not present

## 2014-12-29 DIAGNOSIS — H2513 Age-related nuclear cataract, bilateral: Secondary | ICD-10-CM | POA: Diagnosis not present

## 2014-12-29 DIAGNOSIS — H25013 Cortical age-related cataract, bilateral: Secondary | ICD-10-CM | POA: Diagnosis not present

## 2014-12-29 DIAGNOSIS — H18413 Arcus senilis, bilateral: Secondary | ICD-10-CM | POA: Diagnosis not present

## 2014-12-29 DIAGNOSIS — H11153 Pinguecula, bilateral: Secondary | ICD-10-CM | POA: Diagnosis not present

## 2015-01-04 DIAGNOSIS — E782 Mixed hyperlipidemia: Secondary | ICD-10-CM | POA: Diagnosis not present

## 2015-01-04 DIAGNOSIS — M810 Age-related osteoporosis without current pathological fracture: Secondary | ICD-10-CM | POA: Diagnosis not present

## 2015-01-04 DIAGNOSIS — M150 Primary generalized (osteo)arthritis: Secondary | ICD-10-CM | POA: Diagnosis not present

## 2015-01-04 DIAGNOSIS — I10 Essential (primary) hypertension: Secondary | ICD-10-CM | POA: Diagnosis not present

## 2015-04-05 DIAGNOSIS — E782 Mixed hyperlipidemia: Secondary | ICD-10-CM | POA: Diagnosis not present

## 2015-04-05 DIAGNOSIS — I10 Essential (primary) hypertension: Secondary | ICD-10-CM | POA: Diagnosis not present

## 2015-04-12 DIAGNOSIS — M150 Primary generalized (osteo)arthritis: Secondary | ICD-10-CM | POA: Diagnosis not present

## 2015-04-12 DIAGNOSIS — E782 Mixed hyperlipidemia: Secondary | ICD-10-CM | POA: Diagnosis not present

## 2015-04-12 DIAGNOSIS — I10 Essential (primary) hypertension: Secondary | ICD-10-CM | POA: Diagnosis not present

## 2015-08-17 DIAGNOSIS — Z23 Encounter for immunization: Secondary | ICD-10-CM | POA: Diagnosis not present

## 2015-09-27 DIAGNOSIS — Z1231 Encounter for screening mammogram for malignant neoplasm of breast: Secondary | ICD-10-CM | POA: Diagnosis not present

## 2015-09-27 DIAGNOSIS — Z803 Family history of malignant neoplasm of breast: Secondary | ICD-10-CM | POA: Diagnosis not present

## 2015-10-26 DIAGNOSIS — M150 Primary generalized (osteo)arthritis: Secondary | ICD-10-CM | POA: Diagnosis not present

## 2015-10-26 DIAGNOSIS — I10 Essential (primary) hypertension: Secondary | ICD-10-CM | POA: Diagnosis not present

## 2015-10-26 DIAGNOSIS — E782 Mixed hyperlipidemia: Secondary | ICD-10-CM | POA: Diagnosis not present

## 2015-11-01 DIAGNOSIS — G5601 Carpal tunnel syndrome, right upper limb: Secondary | ICD-10-CM | POA: Diagnosis not present

## 2015-11-01 DIAGNOSIS — M150 Primary generalized (osteo)arthritis: Secondary | ICD-10-CM | POA: Diagnosis not present

## 2015-11-01 DIAGNOSIS — E782 Mixed hyperlipidemia: Secondary | ICD-10-CM | POA: Diagnosis not present

## 2015-11-01 DIAGNOSIS — I10 Essential (primary) hypertension: Secondary | ICD-10-CM | POA: Diagnosis not present

## 2015-11-24 DIAGNOSIS — I10 Essential (primary) hypertension: Secondary | ICD-10-CM | POA: Diagnosis not present

## 2015-11-24 DIAGNOSIS — E782 Mixed hyperlipidemia: Secondary | ICD-10-CM | POA: Diagnosis not present

## 2015-11-24 DIAGNOSIS — G5601 Carpal tunnel syndrome, right upper limb: Secondary | ICD-10-CM | POA: Diagnosis not present

## 2015-12-29 DIAGNOSIS — H11423 Conjunctival edema, bilateral: Secondary | ICD-10-CM | POA: Diagnosis not present

## 2015-12-29 DIAGNOSIS — H16143 Punctate keratitis, bilateral: Secondary | ICD-10-CM | POA: Diagnosis not present

## 2015-12-29 DIAGNOSIS — H52223 Regular astigmatism, bilateral: Secondary | ICD-10-CM | POA: Diagnosis not present

## 2015-12-29 DIAGNOSIS — H2513 Age-related nuclear cataract, bilateral: Secondary | ICD-10-CM | POA: Diagnosis not present

## 2015-12-29 DIAGNOSIS — H5203 Hypermetropia, bilateral: Secondary | ICD-10-CM | POA: Diagnosis not present

## 2015-12-29 DIAGNOSIS — H16421 Pannus (corneal), right eye: Secondary | ICD-10-CM | POA: Diagnosis not present

## 2015-12-29 DIAGNOSIS — H40022 Open angle with borderline findings, high risk, left eye: Secondary | ICD-10-CM | POA: Diagnosis not present

## 2015-12-29 DIAGNOSIS — H04123 Dry eye syndrome of bilateral lacrimal glands: Secondary | ICD-10-CM | POA: Diagnosis not present

## 2015-12-29 DIAGNOSIS — H524 Presbyopia: Secondary | ICD-10-CM | POA: Diagnosis not present

## 2015-12-29 DIAGNOSIS — H25013 Cortical age-related cataract, bilateral: Secondary | ICD-10-CM | POA: Diagnosis not present

## 2015-12-29 DIAGNOSIS — H18413 Arcus senilis, bilateral: Secondary | ICD-10-CM | POA: Diagnosis not present

## 2015-12-29 DIAGNOSIS — H40033 Anatomical narrow angle, bilateral: Secondary | ICD-10-CM | POA: Diagnosis not present

## 2016-01-22 DIAGNOSIS — G5603 Carpal tunnel syndrome, bilateral upper limbs: Secondary | ICD-10-CM | POA: Diagnosis not present

## 2016-01-22 DIAGNOSIS — G5602 Carpal tunnel syndrome, left upper limb: Secondary | ICD-10-CM | POA: Diagnosis not present

## 2016-01-22 DIAGNOSIS — M79641 Pain in right hand: Secondary | ICD-10-CM | POA: Diagnosis not present

## 2016-01-22 DIAGNOSIS — M79642 Pain in left hand: Secondary | ICD-10-CM | POA: Diagnosis not present

## 2016-01-22 DIAGNOSIS — M180 Bilateral primary osteoarthritis of first carpometacarpal joints: Secondary | ICD-10-CM | POA: Diagnosis not present

## 2016-02-29 DIAGNOSIS — Z0000 Encounter for general adult medical examination without abnormal findings: Secondary | ICD-10-CM | POA: Diagnosis not present

## 2016-02-29 DIAGNOSIS — E782 Mixed hyperlipidemia: Secondary | ICD-10-CM | POA: Diagnosis not present

## 2016-02-29 DIAGNOSIS — I10 Essential (primary) hypertension: Secondary | ICD-10-CM | POA: Diagnosis not present

## 2016-03-07 DIAGNOSIS — M150 Primary generalized (osteo)arthritis: Secondary | ICD-10-CM | POA: Diagnosis not present

## 2016-03-07 DIAGNOSIS — E782 Mixed hyperlipidemia: Secondary | ICD-10-CM | POA: Diagnosis not present

## 2016-03-07 DIAGNOSIS — I872 Venous insufficiency (chronic) (peripheral): Secondary | ICD-10-CM | POA: Diagnosis not present

## 2016-03-07 DIAGNOSIS — I10 Essential (primary) hypertension: Secondary | ICD-10-CM | POA: Diagnosis not present

## 2016-03-22 DIAGNOSIS — H2511 Age-related nuclear cataract, right eye: Secondary | ICD-10-CM | POA: Diagnosis not present

## 2016-03-22 DIAGNOSIS — H2512 Age-related nuclear cataract, left eye: Secondary | ICD-10-CM | POA: Diagnosis not present

## 2016-03-22 DIAGNOSIS — H18411 Arcus senilis, right eye: Secondary | ICD-10-CM | POA: Diagnosis not present

## 2016-03-22 DIAGNOSIS — H18412 Arcus senilis, left eye: Secondary | ICD-10-CM | POA: Diagnosis not present

## 2016-06-19 DIAGNOSIS — Z96652 Presence of left artificial knee joint: Secondary | ICD-10-CM | POA: Diagnosis not present

## 2016-06-19 DIAGNOSIS — Z471 Aftercare following joint replacement surgery: Secondary | ICD-10-CM | POA: Diagnosis not present

## 2016-06-24 DIAGNOSIS — H2511 Age-related nuclear cataract, right eye: Secondary | ICD-10-CM | POA: Diagnosis not present

## 2016-06-25 DIAGNOSIS — H25012 Cortical age-related cataract, left eye: Secondary | ICD-10-CM | POA: Diagnosis not present

## 2016-07-15 DIAGNOSIS — H2512 Age-related nuclear cataract, left eye: Secondary | ICD-10-CM | POA: Diagnosis not present

## 2016-08-19 DIAGNOSIS — Z23 Encounter for immunization: Secondary | ICD-10-CM | POA: Diagnosis not present

## 2016-09-19 DIAGNOSIS — E782 Mixed hyperlipidemia: Secondary | ICD-10-CM | POA: Diagnosis not present

## 2016-09-19 DIAGNOSIS — I10 Essential (primary) hypertension: Secondary | ICD-10-CM | POA: Diagnosis not present

## 2016-09-26 DIAGNOSIS — I10 Essential (primary) hypertension: Secondary | ICD-10-CM | POA: Diagnosis not present

## 2016-09-26 DIAGNOSIS — M150 Primary generalized (osteo)arthritis: Secondary | ICD-10-CM | POA: Diagnosis not present

## 2016-09-26 DIAGNOSIS — E782 Mixed hyperlipidemia: Secondary | ICD-10-CM | POA: Diagnosis not present

## 2016-09-27 DIAGNOSIS — Z1231 Encounter for screening mammogram for malignant neoplasm of breast: Secondary | ICD-10-CM | POA: Diagnosis not present

## 2016-12-16 DIAGNOSIS — G5603 Carpal tunnel syndrome, bilateral upper limbs: Secondary | ICD-10-CM | POA: Diagnosis not present

## 2016-12-16 DIAGNOSIS — M79641 Pain in right hand: Secondary | ICD-10-CM | POA: Diagnosis not present

## 2016-12-16 DIAGNOSIS — M79642 Pain in left hand: Secondary | ICD-10-CM | POA: Diagnosis not present

## 2016-12-16 DIAGNOSIS — M180 Bilateral primary osteoarthritis of first carpometacarpal joints: Secondary | ICD-10-CM | POA: Diagnosis not present

## 2016-12-23 DIAGNOSIS — M79642 Pain in left hand: Secondary | ICD-10-CM | POA: Diagnosis not present

## 2016-12-23 DIAGNOSIS — M79641 Pain in right hand: Secondary | ICD-10-CM | POA: Diagnosis not present

## 2017-01-21 DIAGNOSIS — G5602 Carpal tunnel syndrome, left upper limb: Secondary | ICD-10-CM | POA: Diagnosis not present

## 2017-01-28 DIAGNOSIS — G5603 Carpal tunnel syndrome, bilateral upper limbs: Secondary | ICD-10-CM | POA: Diagnosis not present

## 2017-01-28 DIAGNOSIS — Z4789 Encounter for other orthopedic aftercare: Secondary | ICD-10-CM | POA: Diagnosis not present

## 2017-02-04 DIAGNOSIS — G5602 Carpal tunnel syndrome, left upper limb: Secondary | ICD-10-CM | POA: Diagnosis not present

## 2017-02-17 DIAGNOSIS — G5602 Carpal tunnel syndrome, left upper limb: Secondary | ICD-10-CM | POA: Diagnosis not present

## 2017-02-17 DIAGNOSIS — G5603 Carpal tunnel syndrome, bilateral upper limbs: Secondary | ICD-10-CM | POA: Diagnosis not present

## 2017-02-17 DIAGNOSIS — Z4789 Encounter for other orthopedic aftercare: Secondary | ICD-10-CM | POA: Diagnosis not present

## 2017-02-19 DIAGNOSIS — I10 Essential (primary) hypertension: Secondary | ICD-10-CM | POA: Diagnosis not present

## 2017-02-19 DIAGNOSIS — H26493 Other secondary cataract, bilateral: Secondary | ICD-10-CM | POA: Diagnosis not present

## 2017-02-19 DIAGNOSIS — H3589 Other specified retinal disorders: Secondary | ICD-10-CM | POA: Diagnosis not present

## 2017-02-19 DIAGNOSIS — H43811 Vitreous degeneration, right eye: Secondary | ICD-10-CM | POA: Diagnosis not present

## 2017-02-19 DIAGNOSIS — H18413 Arcus senilis, bilateral: Secondary | ICD-10-CM | POA: Diagnosis not present

## 2017-02-19 DIAGNOSIS — H11153 Pinguecula, bilateral: Secondary | ICD-10-CM | POA: Diagnosis not present

## 2017-02-19 DIAGNOSIS — H04123 Dry eye syndrome of bilateral lacrimal glands: Secondary | ICD-10-CM | POA: Diagnosis not present

## 2017-02-19 DIAGNOSIS — H35033 Hypertensive retinopathy, bilateral: Secondary | ICD-10-CM | POA: Diagnosis not present

## 2017-02-19 DIAGNOSIS — H40003 Preglaucoma, unspecified, bilateral: Secondary | ICD-10-CM | POA: Diagnosis not present

## 2017-02-19 DIAGNOSIS — H353131 Nonexudative age-related macular degeneration, bilateral, early dry stage: Secondary | ICD-10-CM | POA: Diagnosis not present

## 2017-02-19 DIAGNOSIS — H40023 Open angle with borderline findings, high risk, bilateral: Secondary | ICD-10-CM | POA: Diagnosis not present

## 2017-02-19 DIAGNOSIS — H11423 Conjunctival edema, bilateral: Secondary | ICD-10-CM | POA: Diagnosis not present

## 2017-03-11 DIAGNOSIS — G5601 Carpal tunnel syndrome, right upper limb: Secondary | ICD-10-CM | POA: Diagnosis not present

## 2017-03-13 DIAGNOSIS — E782 Mixed hyperlipidemia: Secondary | ICD-10-CM | POA: Diagnosis not present

## 2017-03-13 DIAGNOSIS — I10 Essential (primary) hypertension: Secondary | ICD-10-CM | POA: Diagnosis not present

## 2017-03-13 DIAGNOSIS — Z0000 Encounter for general adult medical examination without abnormal findings: Secondary | ICD-10-CM | POA: Diagnosis not present

## 2017-03-13 DIAGNOSIS — Z780 Asymptomatic menopausal state: Secondary | ICD-10-CM | POA: Diagnosis not present

## 2017-03-18 DIAGNOSIS — Z4789 Encounter for other orthopedic aftercare: Secondary | ICD-10-CM | POA: Diagnosis not present

## 2017-03-18 DIAGNOSIS — G5603 Carpal tunnel syndrome, bilateral upper limbs: Secondary | ICD-10-CM | POA: Diagnosis not present

## 2017-03-20 DIAGNOSIS — M150 Primary generalized (osteo)arthritis: Secondary | ICD-10-CM | POA: Diagnosis not present

## 2017-03-20 DIAGNOSIS — I872 Venous insufficiency (chronic) (peripheral): Secondary | ICD-10-CM | POA: Diagnosis not present

## 2017-03-20 DIAGNOSIS — R600 Localized edema: Secondary | ICD-10-CM | POA: Diagnosis not present

## 2017-03-20 DIAGNOSIS — E782 Mixed hyperlipidemia: Secondary | ICD-10-CM | POA: Diagnosis not present

## 2017-03-24 DIAGNOSIS — G5601 Carpal tunnel syndrome, right upper limb: Secondary | ICD-10-CM | POA: Diagnosis not present

## 2017-04-07 DIAGNOSIS — G5601 Carpal tunnel syndrome, right upper limb: Secondary | ICD-10-CM | POA: Diagnosis not present

## 2017-04-10 DIAGNOSIS — Z4789 Encounter for other orthopedic aftercare: Secondary | ICD-10-CM | POA: Diagnosis not present

## 2017-04-10 DIAGNOSIS — G5603 Carpal tunnel syndrome, bilateral upper limbs: Secondary | ICD-10-CM | POA: Diagnosis not present

## 2017-07-27 DIAGNOSIS — Z23 Encounter for immunization: Secondary | ICD-10-CM | POA: Diagnosis not present

## 2017-08-21 DIAGNOSIS — E782 Mixed hyperlipidemia: Secondary | ICD-10-CM | POA: Diagnosis not present

## 2017-08-21 DIAGNOSIS — I872 Venous insufficiency (chronic) (peripheral): Secondary | ICD-10-CM | POA: Diagnosis not present

## 2017-08-21 DIAGNOSIS — I10 Essential (primary) hypertension: Secondary | ICD-10-CM | POA: Diagnosis not present

## 2017-09-11 DIAGNOSIS — E782 Mixed hyperlipidemia: Secondary | ICD-10-CM | POA: Diagnosis not present

## 2017-09-11 DIAGNOSIS — I10 Essential (primary) hypertension: Secondary | ICD-10-CM | POA: Diagnosis not present

## 2017-09-29 DIAGNOSIS — H11153 Pinguecula, bilateral: Secondary | ICD-10-CM | POA: Diagnosis not present

## 2017-09-29 DIAGNOSIS — Z9841 Cataract extraction status, right eye: Secondary | ICD-10-CM | POA: Diagnosis not present

## 2017-09-29 DIAGNOSIS — H40021 Open angle with borderline findings, high risk, right eye: Secondary | ICD-10-CM | POA: Diagnosis not present

## 2017-09-29 DIAGNOSIS — H16421 Pannus (corneal), right eye: Secondary | ICD-10-CM | POA: Diagnosis not present

## 2017-09-29 DIAGNOSIS — H5340 Unspecified visual field defects: Secondary | ICD-10-CM | POA: Diagnosis not present

## 2017-09-29 DIAGNOSIS — H04123 Dry eye syndrome of bilateral lacrimal glands: Secondary | ICD-10-CM | POA: Diagnosis not present

## 2017-09-29 DIAGNOSIS — H353131 Nonexudative age-related macular degeneration, bilateral, early dry stage: Secondary | ICD-10-CM | POA: Diagnosis not present

## 2017-09-29 DIAGNOSIS — Z961 Presence of intraocular lens: Secondary | ICD-10-CM | POA: Diagnosis not present

## 2017-09-29 DIAGNOSIS — H11423 Conjunctival edema, bilateral: Secondary | ICD-10-CM | POA: Diagnosis not present

## 2017-09-29 DIAGNOSIS — H26492 Other secondary cataract, left eye: Secondary | ICD-10-CM | POA: Diagnosis not present

## 2017-09-29 DIAGNOSIS — H401121 Primary open-angle glaucoma, left eye, mild stage: Secondary | ICD-10-CM | POA: Diagnosis not present

## 2017-09-29 DIAGNOSIS — H18413 Arcus senilis, bilateral: Secondary | ICD-10-CM | POA: Diagnosis not present

## 2017-09-30 DIAGNOSIS — Z1231 Encounter for screening mammogram for malignant neoplasm of breast: Secondary | ICD-10-CM | POA: Diagnosis not present

## 2017-10-30 DIAGNOSIS — M79641 Pain in right hand: Secondary | ICD-10-CM | POA: Diagnosis not present

## 2017-11-19 DIAGNOSIS — H401122 Primary open-angle glaucoma, left eye, moderate stage: Secondary | ICD-10-CM | POA: Diagnosis not present

## 2017-11-19 DIAGNOSIS — Z961 Presence of intraocular lens: Secondary | ICD-10-CM | POA: Diagnosis not present

## 2017-11-19 DIAGNOSIS — H401111 Primary open-angle glaucoma, right eye, mild stage: Secondary | ICD-10-CM | POA: Diagnosis not present

## 2017-12-30 DIAGNOSIS — H401111 Primary open-angle glaucoma, right eye, mild stage: Secondary | ICD-10-CM | POA: Diagnosis not present

## 2017-12-30 DIAGNOSIS — H401122 Primary open-angle glaucoma, left eye, moderate stage: Secondary | ICD-10-CM | POA: Diagnosis not present

## 2017-12-30 DIAGNOSIS — Z961 Presence of intraocular lens: Secondary | ICD-10-CM | POA: Diagnosis not present

## 2017-12-31 DIAGNOSIS — I10 Essential (primary) hypertension: Secondary | ICD-10-CM | POA: Diagnosis not present

## 2017-12-31 DIAGNOSIS — E782 Mixed hyperlipidemia: Secondary | ICD-10-CM | POA: Diagnosis not present

## 2018-01-07 DIAGNOSIS — I10 Essential (primary) hypertension: Secondary | ICD-10-CM | POA: Diagnosis not present

## 2018-01-07 DIAGNOSIS — I872 Venous insufficiency (chronic) (peripheral): Secondary | ICD-10-CM | POA: Diagnosis not present

## 2018-01-07 DIAGNOSIS — E782 Mixed hyperlipidemia: Secondary | ICD-10-CM | POA: Diagnosis not present

## 2018-01-20 DIAGNOSIS — Z961 Presence of intraocular lens: Secondary | ICD-10-CM | POA: Diagnosis not present

## 2018-01-20 DIAGNOSIS — H401111 Primary open-angle glaucoma, right eye, mild stage: Secondary | ICD-10-CM | POA: Diagnosis not present

## 2018-01-20 DIAGNOSIS — H401122 Primary open-angle glaucoma, left eye, moderate stage: Secondary | ICD-10-CM | POA: Diagnosis not present

## 2018-04-21 DIAGNOSIS — H11153 Pinguecula, bilateral: Secondary | ICD-10-CM | POA: Diagnosis not present

## 2018-04-21 DIAGNOSIS — H0100A Unspecified blepharitis right eye, upper and lower eyelids: Secondary | ICD-10-CM | POA: Diagnosis not present

## 2018-04-21 DIAGNOSIS — H401111 Primary open-angle glaucoma, right eye, mild stage: Secondary | ICD-10-CM | POA: Diagnosis not present

## 2018-04-21 DIAGNOSIS — H47233 Glaucomatous optic atrophy, bilateral: Secondary | ICD-10-CM | POA: Diagnosis not present

## 2018-04-21 DIAGNOSIS — Z961 Presence of intraocular lens: Secondary | ICD-10-CM | POA: Diagnosis not present

## 2018-04-21 DIAGNOSIS — H18413 Arcus senilis, bilateral: Secondary | ICD-10-CM | POA: Diagnosis not present

## 2018-04-21 DIAGNOSIS — H401122 Primary open-angle glaucoma, left eye, moderate stage: Secondary | ICD-10-CM | POA: Diagnosis not present

## 2018-04-21 DIAGNOSIS — Z9842 Cataract extraction status, left eye: Secondary | ICD-10-CM | POA: Diagnosis not present

## 2018-04-21 DIAGNOSIS — H04123 Dry eye syndrome of bilateral lacrimal glands: Secondary | ICD-10-CM | POA: Diagnosis not present

## 2018-04-21 DIAGNOSIS — H26491 Other secondary cataract, right eye: Secondary | ICD-10-CM | POA: Diagnosis not present

## 2018-04-21 DIAGNOSIS — H0100B Unspecified blepharitis left eye, upper and lower eyelids: Secondary | ICD-10-CM | POA: Diagnosis not present

## 2018-06-02 DIAGNOSIS — E782 Mixed hyperlipidemia: Secondary | ICD-10-CM | POA: Diagnosis not present

## 2018-06-02 DIAGNOSIS — N390 Urinary tract infection, site not specified: Secondary | ICD-10-CM | POA: Diagnosis not present

## 2018-06-02 DIAGNOSIS — I872 Venous insufficiency (chronic) (peripheral): Secondary | ICD-10-CM | POA: Diagnosis not present

## 2018-06-02 DIAGNOSIS — I10 Essential (primary) hypertension: Secondary | ICD-10-CM | POA: Diagnosis not present

## 2018-06-02 DIAGNOSIS — Z0000 Encounter for general adult medical examination without abnormal findings: Secondary | ICD-10-CM | POA: Diagnosis not present

## 2018-06-09 DIAGNOSIS — N182 Chronic kidney disease, stage 2 (mild): Secondary | ICD-10-CM | POA: Diagnosis not present

## 2018-06-09 DIAGNOSIS — R600 Localized edema: Secondary | ICD-10-CM | POA: Diagnosis not present

## 2018-06-09 DIAGNOSIS — I872 Venous insufficiency (chronic) (peripheral): Secondary | ICD-10-CM | POA: Diagnosis not present

## 2018-06-09 DIAGNOSIS — M150 Primary generalized (osteo)arthritis: Secondary | ICD-10-CM | POA: Diagnosis not present

## 2018-06-09 DIAGNOSIS — E782 Mixed hyperlipidemia: Secondary | ICD-10-CM | POA: Diagnosis not present

## 2018-06-09 DIAGNOSIS — M810 Age-related osteoporosis without current pathological fracture: Secondary | ICD-10-CM | POA: Diagnosis not present

## 2018-06-09 DIAGNOSIS — I10 Essential (primary) hypertension: Secondary | ICD-10-CM | POA: Diagnosis not present

## 2018-06-09 DIAGNOSIS — R7303 Prediabetes: Secondary | ICD-10-CM | POA: Diagnosis not present

## 2018-07-30 DIAGNOSIS — H18413 Arcus senilis, bilateral: Secondary | ICD-10-CM | POA: Diagnosis not present

## 2018-07-30 DIAGNOSIS — H401111 Primary open-angle glaucoma, right eye, mild stage: Secondary | ICD-10-CM | POA: Diagnosis not present

## 2018-07-30 DIAGNOSIS — H04123 Dry eye syndrome of bilateral lacrimal glands: Secondary | ICD-10-CM | POA: Diagnosis not present

## 2018-07-30 DIAGNOSIS — H47233 Glaucomatous optic atrophy, bilateral: Secondary | ICD-10-CM | POA: Diagnosis not present

## 2018-07-30 DIAGNOSIS — H16421 Pannus (corneal), right eye: Secondary | ICD-10-CM | POA: Diagnosis not present

## 2018-07-30 DIAGNOSIS — H0102B Squamous blepharitis left eye, upper and lower eyelids: Secondary | ICD-10-CM | POA: Diagnosis not present

## 2018-07-30 DIAGNOSIS — H401122 Primary open-angle glaucoma, left eye, moderate stage: Secondary | ICD-10-CM | POA: Diagnosis not present

## 2018-07-30 DIAGNOSIS — Z961 Presence of intraocular lens: Secondary | ICD-10-CM | POA: Diagnosis not present

## 2018-07-30 DIAGNOSIS — H11823 Conjunctivochalasis, bilateral: Secondary | ICD-10-CM | POA: Diagnosis not present

## 2018-07-30 DIAGNOSIS — Z9849 Cataract extraction status, unspecified eye: Secondary | ICD-10-CM | POA: Diagnosis not present

## 2018-07-30 DIAGNOSIS — H0102A Squamous blepharitis right eye, upper and lower eyelids: Secondary | ICD-10-CM | POA: Diagnosis not present

## 2018-07-30 DIAGNOSIS — H11153 Pinguecula, bilateral: Secondary | ICD-10-CM | POA: Diagnosis not present

## 2018-08-13 DIAGNOSIS — Z23 Encounter for immunization: Secondary | ICD-10-CM | POA: Diagnosis not present

## 2018-10-01 DIAGNOSIS — Z1231 Encounter for screening mammogram for malignant neoplasm of breast: Secondary | ICD-10-CM | POA: Diagnosis not present

## 2018-10-01 DIAGNOSIS — Z803 Family history of malignant neoplasm of breast: Secondary | ICD-10-CM | POA: Diagnosis not present

## 2018-10-29 DIAGNOSIS — H401122 Primary open-angle glaucoma, left eye, moderate stage: Secondary | ICD-10-CM | POA: Diagnosis not present

## 2018-11-03 DIAGNOSIS — E782 Mixed hyperlipidemia: Secondary | ICD-10-CM | POA: Diagnosis not present

## 2018-11-03 DIAGNOSIS — I10 Essential (primary) hypertension: Secondary | ICD-10-CM | POA: Diagnosis not present

## 2018-11-03 DIAGNOSIS — R7303 Prediabetes: Secondary | ICD-10-CM | POA: Diagnosis not present

## 2018-11-10 DIAGNOSIS — R7303 Prediabetes: Secondary | ICD-10-CM | POA: Diagnosis not present

## 2018-11-10 DIAGNOSIS — I10 Essential (primary) hypertension: Secondary | ICD-10-CM | POA: Diagnosis not present

## 2018-11-10 DIAGNOSIS — N182 Chronic kidney disease, stage 2 (mild): Secondary | ICD-10-CM | POA: Diagnosis not present

## 2018-11-10 DIAGNOSIS — E782 Mixed hyperlipidemia: Secondary | ICD-10-CM | POA: Diagnosis not present

## 2018-11-10 DIAGNOSIS — I872 Venous insufficiency (chronic) (peripheral): Secondary | ICD-10-CM | POA: Diagnosis not present

## 2018-12-01 DIAGNOSIS — R142 Eructation: Secondary | ICD-10-CM | POA: Diagnosis not present

## 2018-12-01 DIAGNOSIS — I10 Essential (primary) hypertension: Secondary | ICD-10-CM | POA: Diagnosis not present

## 2018-12-01 DIAGNOSIS — R109 Unspecified abdominal pain: Secondary | ICD-10-CM | POA: Diagnosis not present

## 2018-12-01 DIAGNOSIS — R0602 Shortness of breath: Secondary | ICD-10-CM | POA: Diagnosis not present

## 2018-12-03 ENCOUNTER — Emergency Department (HOSPITAL_COMMUNITY): Payer: Medicare Other

## 2018-12-03 ENCOUNTER — Other Ambulatory Visit: Payer: Self-pay

## 2018-12-03 ENCOUNTER — Observation Stay (HOSPITAL_COMMUNITY)
Admission: EM | Admit: 2018-12-03 | Discharge: 2018-12-06 | Disposition: A | Discharge status: Home / Self Care | Payer: Medicare Other | Attending: Family Medicine | Admitting: Family Medicine

## 2018-12-03 ENCOUNTER — Emergency Department (HOSPITAL_BASED_OUTPATIENT_CLINIC_OR_DEPARTMENT_OTHER)
Admit: 2018-12-03 | Discharge: 2018-12-03 | Disposition: A | Discharge status: Home / Self Care | Payer: Medicare Other | Attending: Emergency Medicine | Admitting: Emergency Medicine

## 2018-12-03 ENCOUNTER — Encounter (HOSPITAL_COMMUNITY): Payer: Self-pay

## 2018-12-03 DIAGNOSIS — I82401 Acute embolism and thrombosis of unspecified deep veins of right lower extremity: Secondary | ICD-10-CM | POA: Diagnosis not present

## 2018-12-03 DIAGNOSIS — I82431 Acute embolism and thrombosis of right popliteal vein: Secondary | ICD-10-CM

## 2018-12-03 DIAGNOSIS — E278 Other specified disorders of adrenal gland: Secondary | ICD-10-CM | POA: Insufficient documentation

## 2018-12-03 DIAGNOSIS — I700 Atherosclerosis of aorta: Secondary | ICD-10-CM | POA: Insufficient documentation

## 2018-12-03 DIAGNOSIS — I82461 Acute embolism and thrombosis of right calf muscular vein: Secondary | ICD-10-CM | POA: Insufficient documentation

## 2018-12-03 DIAGNOSIS — Z96653 Presence of artificial knee joint, bilateral: Secondary | ICD-10-CM | POA: Insufficient documentation

## 2018-12-03 DIAGNOSIS — E785 Hyperlipidemia, unspecified: Secondary | ICD-10-CM | POA: Insufficient documentation

## 2018-12-03 DIAGNOSIS — F17210 Nicotine dependence, cigarettes, uncomplicated: Secondary | ICD-10-CM | POA: Insufficient documentation

## 2018-12-03 DIAGNOSIS — R0689 Other abnormalities of breathing: Secondary | ICD-10-CM | POA: Diagnosis not present

## 2018-12-03 DIAGNOSIS — I2510 Atherosclerotic heart disease of native coronary artery without angina pectoris: Secondary | ICD-10-CM | POA: Insufficient documentation

## 2018-12-03 DIAGNOSIS — I248 Other forms of acute ischemic heart disease: Secondary | ICD-10-CM | POA: Insufficient documentation

## 2018-12-03 DIAGNOSIS — I2609 Other pulmonary embolism with acute cor pulmonale: Secondary | ICD-10-CM

## 2018-12-03 DIAGNOSIS — I358 Other nonrheumatic aortic valve disorders: Secondary | ICD-10-CM | POA: Diagnosis not present

## 2018-12-03 DIAGNOSIS — R0602 Shortness of breath: Secondary | ICD-10-CM | POA: Diagnosis not present

## 2018-12-03 DIAGNOSIS — I2699 Other pulmonary embolism without acute cor pulmonale: Principal | ICD-10-CM | POA: Insufficient documentation

## 2018-12-03 DIAGNOSIS — R9431 Abnormal electrocardiogram [ECG] [EKG]: Secondary | ICD-10-CM | POA: Diagnosis not present

## 2018-12-03 DIAGNOSIS — Z79899 Other long term (current) drug therapy: Secondary | ICD-10-CM | POA: Insufficient documentation

## 2018-12-03 DIAGNOSIS — N2889 Other specified disorders of kidney and ureter: Secondary | ICD-10-CM

## 2018-12-03 DIAGNOSIS — I10 Essential (primary) hypertension: Secondary | ICD-10-CM | POA: Diagnosis not present

## 2018-12-03 HISTORY — DX: Atherosclerotic heart disease of native coronary artery without angina pectoris: I25.10

## 2018-12-03 HISTORY — DX: Atherosclerosis of aorta: I70.0

## 2018-12-03 LAB — COMPREHENSIVE METABOLIC PANEL
ALT: 17 U/L (ref 0–44)
AST: 14 U/L — ABNORMAL LOW (ref 15–41)
Albumin: 4.2 g/dL (ref 3.5–5.0)
Alkaline Phosphatase: 98 U/L (ref 38–126)
Anion gap: 8 (ref 5–15)
BUN: 16 mg/dL (ref 8–23)
CO2: 26 mmol/L (ref 22–32)
Calcium: 9.4 mg/dL (ref 8.9–10.3)
Chloride: 105 mmol/L (ref 98–111)
Creatinine, Ser: 0.8 mg/dL (ref 0.44–1.00)
GFR calc Af Amer: >60 mL/min (ref >60)
GFR calc non Af Amer: >60 mL/min (ref >60)
Glucose, Bld: 109 mg/dL — ABNORMAL HIGH (ref 70–99)
Potassium: 3.6 mmol/L (ref 3.5–5.1)
Sodium: 139 mmol/L (ref 135–145)
Total Bilirubin: 0.6 mg/dL (ref 0.3–1.2)
Total Protein: 7.2 g/dL (ref 6.5–8.1)

## 2018-12-03 LAB — CBC WITH DIFFERENTIAL/PLATELET
Abs Immature Granulocytes: 0.03 10*3/uL (ref 0.00–0.07)
Basophils Absolute: 0.1 10*3/uL (ref 0.0–0.1)
Basophils Relative: 1 %
Eosinophils Absolute: 0.2 10*3/uL (ref 0.0–0.5)
Eosinophils Relative: 3 %
HCT: 40.4 % (ref 36.0–46.0)
Hemoglobin: 13 g/dL (ref 12.0–15.0)
Immature Granulocytes: 0 %
Lymphocytes Relative: 38 %
Lymphs Abs: 2.8 10*3/uL (ref 0.7–4.0)
MCH: 28.1 pg (ref 26.0–34.0)
MCHC: 32.2 g/dL (ref 30.0–36.0)
MCV: 87.3 fL (ref 80.0–100.0)
Monocytes Absolute: 0.6 10*3/uL (ref 0.1–1.0)
Monocytes Relative: 8 %
Neutro Abs: 3.7 10*3/uL (ref 1.7–7.7)
Neutrophils Relative %: 50 %
Platelets: 162 10*3/uL (ref 150–400)
RBC: 4.63 MIL/uL (ref 3.87–5.11)
RDW: 14.3 % (ref 11.5–15.5)
WBC: 7.3 10*3/uL (ref 4.0–10.5)
nRBC: 0 % (ref 0.0–0.2)

## 2018-12-03 LAB — TROPONIN I
TROPONIN I: 0.18 ng/mL — AB (ref <0.03)
Troponin I: 0.24 ng/mL (ref <0.03)

## 2018-12-03 LAB — BRAIN NATRIURETIC PEPTIDE: B Natriuretic Peptide: 76.5 pg/mL (ref 0.0–100.0)

## 2018-12-03 LAB — D-DIMER, QUANTITATIVE: D-Dimer, Quant: >20 ug/mL-FEU — ABNORMAL HIGH (ref 0.00–0.50)

## 2018-12-03 MED ORDER — ACETAMINOPHEN 650 MG RE SUPP: 650.0000 mg | Freq: Four times a day (QID) | RECTAL | Status: DC | PRN

## 2018-12-03 MED ORDER — BRIMONIDINE TARTRATE 0.2 % OP SOLN
1.0000 [drp] | Freq: Two times a day (BID) | OPHTHALMIC | Status: DC
  Administered 2018-12-03 – 2018-12-06 (×6): 1 [drp] via OPHTHALMIC
  Filled 2018-12-03: qty 5

## 2018-12-03 MED ORDER — LOSARTAN POTASSIUM 50 MG PO TABS
100.0000 mg | ORAL_TABLET | Freq: Every day | ORAL | Status: DC
  Administered 2018-12-04 – 2018-12-06 (×3): 100 mg via ORAL
  Filled 2018-12-03 (×4): qty 2

## 2018-12-03 MED ORDER — LOSARTAN POTASSIUM-HCTZ 100-25 MG PO TABS: 1.0000 | ORAL_TABLET | Freq: Every day | ORAL | Status: DC

## 2018-12-03 MED ORDER — VITAMIN D 25 MCG (1000 UNIT) PO TABS
1000.0000 [IU] | ORAL_TABLET | Freq: Every day | ORAL | Status: DC
  Administered 2018-12-04 – 2018-12-06 (×3): 1000 [IU] via ORAL
  Filled 2018-12-03 (×3): qty 1

## 2018-12-03 MED ORDER — ONDANSETRON HCL 4 MG/2ML IJ SOLN: 4.0000 mg | Freq: Four times a day (QID) | INTRAMUSCULAR | Status: DC | PRN

## 2018-12-03 MED ORDER — IOPAMIDOL (ISOVUE-370) INJECTION 76%
100.0000 mL | Freq: Once | INTRAVENOUS | Status: AC | PRN
  Administered 2018-12-03: 100 mL via INTRAVENOUS

## 2018-12-03 MED ORDER — ATORVASTATIN CALCIUM 20 MG PO TABS
20.0000 mg | ORAL_TABLET | Freq: Every evening | ORAL | Status: DC
  Administered 2018-12-03 – 2018-12-05 (×3): 20 mg via ORAL
  Filled 2018-12-03 (×3): qty 1

## 2018-12-03 MED ORDER — RIVAROXABAN 15 MG PO TABS
15.0000 mg | ORAL_TABLET | Freq: Two times a day (BID) | ORAL | Status: DC
  Administered 2018-12-04 – 2018-12-06 (×5): 15 mg via ORAL
  Filled 2018-12-03 (×6): qty 1

## 2018-12-03 MED ORDER — ACETAMINOPHEN 325 MG PO TABS: 650.0000 mg | ORAL_TABLET | Freq: Four times a day (QID) | ORAL | Status: DC | PRN

## 2018-12-03 MED ORDER — SODIUM CHLORIDE (PF) 0.9 % IJ SOLN
INTRAMUSCULAR | Status: AC
  Filled 2018-12-03: qty 100

## 2018-12-03 MED ORDER — ONDANSETRON HCL 4 MG PO TABS: 4.0000 mg | ORAL_TABLET | Freq: Four times a day (QID) | ORAL | Status: DC | PRN

## 2018-12-03 MED ORDER — RIVAROXABAN 20 MG PO TABS: 20.0000 mg | ORAL_TABLET | Freq: Every day | ORAL | Status: DC

## 2018-12-03 MED ORDER — AMLODIPINE BESYLATE 5 MG PO TABS
5.0000 mg | ORAL_TABLET | Freq: Every morning | ORAL | Status: DC
  Administered 2018-12-04 – 2018-12-06 (×3): 5 mg via ORAL
  Filled 2018-12-03 (×3): qty 1

## 2018-12-03 MED ORDER — TIMOLOL MALEATE 0.5 % OP SOLN
1.0000 [drp] | Freq: Two times a day (BID) | OPHTHALMIC | Status: DC
  Administered 2018-12-03 – 2018-12-06 (×6): 1 [drp] via OPHTHALMIC
  Filled 2018-12-03: qty 5

## 2018-12-03 MED ORDER — IOPAMIDOL (ISOVUE-370) INJECTION 76%
INTRAVENOUS | Status: AC
  Filled 2018-12-03: qty 200

## 2018-12-03 MED ORDER — ENOXAPARIN SODIUM 100 MG/ML ~~LOC~~ SOLN
1.0000 mg/kg | Freq: Once | SUBCUTANEOUS | Status: AC
  Administered 2018-12-03: 100 mg via SUBCUTANEOUS
  Filled 2018-12-03: qty 1

## 2018-12-03 MED ORDER — BRIMONIDINE TARTRATE-TIMOLOL 0.2-0.5 % OP SOLN
1.0000 [drp] | Freq: Two times a day (BID) | OPHTHALMIC | Status: DC
  Filled 2018-12-03: qty 5

## 2018-12-03 MED ORDER — HYDROCHLOROTHIAZIDE 25 MG PO TABS
25.0000 mg | ORAL_TABLET | Freq: Every day | ORAL | Status: DC
  Administered 2018-12-04 – 2018-12-06 (×3): 25 mg via ORAL
  Filled 2018-12-03 (×3): qty 1

## 2018-12-03 MED ORDER — PROSIGHT PO TABS
1.0000 | ORAL_TABLET | Freq: Every day | ORAL | Status: DC
  Administered 2018-12-03 – 2018-12-06 (×4): 1 via ORAL
  Filled 2018-12-03 (×4): qty 1

## 2018-12-03 NOTE — Progress Notes (Signed)
Right lower extremity venous duplex has been completed. Preliminary results can be found in CV Proc through chart review.  Results were given to Dr. Wilson Singer.  12/03/18 5:24 PM Kaitlin Arnold RVT

## 2018-12-03 NOTE — Discharge Instructions (Signed)
Information on my medicine - XARELTO (rivaroxaban)  This medication education was reviewed with me or my healthcare representative as part of my discharge preparation.  WHY WAS XARELTO PRESCRIBED FOR YOU? Xarelto was prescribed to treat blood clots that may have been found in the veins of your legs (deep vein thrombosis) or in your lungs (pulmonary embolism) and to reduce the risk of them occurring again.  What do you need to know about Xarelto? The starting dose is one 15 mg tablet taken TWICE daily with food for the FIRST 21 DAYS then on Friday 12/25/18  the dose is changed to one 20 mg tablet taken ONCE A DAY with your evening meal.  DO NOT stop taking Xarelto without talking to the health care provider who prescribed the medication.  Refill your prescription for 20 mg tablets before you run out.  After discharge, you should have regular check-up appointments with your healthcare provider that is prescribing your Xarelto.  In the future your dose may need to be changed if your kidney function changes by a significant amount.  What do you do if you miss a dose? If you are taking Xarelto TWICE DAILY and you miss a dose, take it as soon as you remember. You may take two 15 mg tablets (total 30 mg) at the same time then resume your regularly scheduled 15 mg twice daily the next day.  If you are taking Xarelto ONCE DAILY and you miss a dose, take it as soon as you remember on the same day then continue your regularly scheduled once daily regimen the next day. Do not take two doses of Xarelto at the same time.   Important Safety Information Xarelto is a blood thinner medicine that can cause bleeding. You should call your healthcare provider right away if you experience any of the following: ? Bleeding from an injury or your nose that does not stop. ? Unusual colored urine (red or dark brown) or unusual colored stools (red or black). ? Unusual bruising for unknown reasons. ? A serious  fall or if you hit your head (even if there is no bleeding).  Some medicines may interact with Xarelto and might increase your risk of bleeding while on Xarelto. To help avoid this, consult your healthcare provider or pharmacist prior to using any new prescription or non-prescription medications, including herbals, vitamins, non-steroidal anti-inflammatory drugs (NSAIDs) and supplements.  This website has more information on Xarelto: [email or web address]

## 2018-12-03 NOTE — ED Notes (Signed)
Nurse unavailable to take report at this time.  

## 2018-12-03 NOTE — ED Notes (Signed)
Pt ambulated to the triage room by choice. Pt's O2 saturation was 88 on room air. After sitting for a few minutes O2 increased to 92-94 on room air.

## 2018-12-03 NOTE — ED Notes (Signed)
ED TO INPATIENT HANDOFF REPORT  Name/Age/Gender Kaitlin Arnold 83 y.o. female  Code Status    Code Status Orders  (From admission, onward)         Start     Ordered   12/03/18 1947  Full code  Continuous     12/03/18 1949        Code Status History    Date Active Date Inactive Code Status Order ID Comments User Context   06/09/2012 1448 06/11/2012 1435 Full Code [id number]  Yvonna Alanis, RN Inpatient      Home/SNF/Other Home  Chief Complaint SOB  Level of Care/Admitting Diagnosis ED Disposition    ED Disposition Condition [place]: [facility] [[id number]]  Level of Care: Telemetry [5]  Admit to tele based on following criteria: Other see comments  Comments: PE  Diagnosis: Acute pulmonary embolism with acute cor pulmonale [facility]) [[id number]]  Admitting Physician: Etta Quill [phone]  Attending Physician: Etta Quill [[id number]]  PT Class (Do Not Modify): Observation [104]  PT Acc Code (Do Not Modify): Observation [10022]       Medical History Past Medical History:  Diagnosis Date  . Arthritis    oa and pain left knee;  previous right total knee replacement  1998  . GERD (gastroesophageal reflux disease)    seldom  . H/O cardiovascular stress test 05/27/12   STRESS TEST WAS DONE for cardiac clearance for left total knee replacement--given clearance by dr. Einar Gip.   EF 71%, NO ISCHEMIA  . Hyperlipidemia   . Hypertension   . Shortness of breath    with exertion    Allergies No Known Allergies  IV Location/Drains/Wounds Patient Lines/Drains/Airways Status   Active Line/Drains/Airways    Name:   Placement date:   Placement time:   Site:   Days:   Peripheral IV 12/03/18 Right Antecubital   12/03/18    1634    Antecubital   less than 1   Incision 06/09/12 Knee Left   06/09/12    3818     2368          Labs/Imaging Results for orders placed or performed during the hospital encounter of 12/03/18 (from the past 48  hour(s))  Brain natriuretic peptide     Status: None   Collection Time: 12/03/18  4:31 PM  Result Value Ref Range   B Natriuretic Peptide 76.5 0.0 - 100.0 pg/mL    Comment: Performed at [facility], [place] [address] [place], [place] [address]  CBC with Differential     Status: None   Collection Time: 12/03/18  4:31 PM  Result Value Ref Range   WBC 7.3 4.0 - 10.5 K/uL   RBC 4.63 3.87 - 5.11 MIL/uL   Hemoglobin 13.0 12.0 - 15.0 g/dL   HCT 40.4 36.0 - 46.0 %   MCV 87.3 80.0 - 100.0 fL   MCH 28.1 26.0 - 34.0 pg   MCHC 32.2 30.0 - 36.0 g/dL   RDW 14.3 11.5 - 15.5 %   Platelets 162 150 - 400 K/uL   nRBC 0.0 0.0 - 0.2 %   Neutrophils Relative % 50 %   Neutro Abs 3.7 1.7 - 7.7 K/uL   Lymphocytes Relative 38 %   Lymphs Abs 2.8 0.7 - 4.0 K/uL   Monocytes Relative 8 %   Monocytes Absolute 0.6 0.1 - 1.0 K/uL   Eosinophils Relative 3 %   Eosinophils Absolute 0.2 0.0 -  0.5 K/uL   Basophils Relative 1 %   Basophils Absolute 0.1 0.0 - 0.1 K/uL   WBC Morphology MORPHOLOGY UNREMARKABLE    Immature Granulocytes 0 %   Abs Immature Granulocytes 0.03 0.00 - 0.07 K/uL    Comment: Performed at [facility] [place] [address] [place], [place] [address]  Troponin I - ONCE - STAT     Status: Abnormal   Collection Time: 12/03/18  4:31 PM  Result Value Ref Range   Troponin I 0.24 (HH) <0.03 ng/mL    Comment: CRITICAL RESULT CALLED TO, READ BACK BY AND VERIFIED WITH: M.TEUP AT 1747 ON 12/03/18 BY N.THOMPSON Performed at [facility], [place] [address] [place], [place] [address]   Comprehensive metabolic panel     Status: Abnormal   Collection Time: 12/03/18  4:31 PM  Result Value Ref Range   Sodium 139 135 - 145 mmol/L   Potassium 3.6 3.5 - 5.1 mmol/L   Chloride 105 98 - 111 mmol/L   CO2 26 22 - 32 mmol/L   Glucose, Bld 109 (H) 70 - 99 mg/dL   BUN 16 8 - 23 mg/dL   Creatinine, Ser 0.80 0.44 - 1.00 mg/dL   Calcium 9.4 8.9 - 10.3 mg/dL   Total  Protein 7.2 6.5 - 8.1 g/dL   Albumin 4.2 3.5 - 5.0 g/dL   AST 14 (L) 15 - 41 U/L   ALT 17 0 - 44 U/L   Alkaline Phosphatase 98 38 - 126 U/L   Total Bilirubin 0.6 0.3 - 1.2 mg/dL   GFR calc non Af Amer >60 >60 mL/min   GFR calc Af Amer >60 >60 mL/min   Anion gap 8 5 - 15    Comment: Performed at [facility], [place] [address], [place], [place] [address]  D-dimer, quantitative (not at [facility])     Status: Abnormal   Collection Time: 12/03/18  4:31 PM  Result Value Ref Range   D-Dimer, Quant >20.00 (H) 0.00 - 0.50 ug/mL-FEU    Comment: (NOTE) At the manufacturer cut-off of 0.50 ug/mL FEU, this assay has been documented to exclude PE with a sensitivity and negative predictive value of 97 to 99%.  At this time, this assay has not been approved by the FDA to exclude DVT/VTE. Results should be correlated with clinical presentation. Performed at [facility], [place] [address] [place], [place] [address] CORRECTED ON 02/20 AT 1827: PREVIOUSLY REPORTED AS >20.00    Dg Chest 2 View  Result Date: 12/03/2018 CLINICAL DATA:  Shortness of breath. EXAM: CHEST - 2 VIEW COMPARISON:  06/02/2012 FINDINGS: Enlarged cardiac silhouette. Mediastinal contours appear intact. There is no evidence of focal airspace consolidation, pleural effusion or pneumothorax. Pulmonary vascular congestion. Osseous structures are without acute abnormality. Soft tissues are grossly normal. IMPRESSION: Enlarged cardiac silhouette with pulmonary vascular congestion. Electronically Signed   By: Fidela Salisbury M.D.   On: 12/03/2018 17:10   Ct Angio Chest Pe W And/or Wo Contrast  Result Date: 12/03/2018 CLINICAL DATA:  83 y/o  F; 3 weeks of shortness of breath. EXAM: CT ANGIOGRAPHY CHEST WITH CONTRAST TECHNIQUE: Multidetector CT imaging of the chest was performed using the standard protocol during bolus administration of intravenous contrast. Multiplanar CT image reconstructions and MIPs were  obtained to evaluate the vascular anatomy. CONTRAST:  121mL ISOVUE-370 IOPAMIDOL (ISOVUE-370) INJECTION 76% COMPARISON:  12/03/2018 chest radiograph FINDINGS: Cardiovascular: Bilateral acute lobar, segmental, and small vessel pulmonary emboli. RV/LV = 1.3. Mild  cardiomegaly. No pericardial effusion. Coronary artery and aortic calcific atherosclerosis. Mediastinum/Nodes: No enlarged mediastinal, hilar, or axillary lymph nodes. Thyroid gland, trachea, and esophagus demonstrate no significant findings. Lungs/Pleura: Lungs are clear. No pleural effusion or pneumothorax. Upper Abdomen: 17 mm right adrenal nodule measuring -45 HU, possibly a myelolipoma or adenoma with volume averaging. Left kidney nodule measuring 21 mm and 83 HU compatible with hemorrhagic cyst. Left kidney upper pole 73 mm simple cyst. Left kidney upper pole indeterminate 24 mm mass measuring 59 HU. Musculoskeletal: No acute finding. Review of the MIP images confirms the above findings. IMPRESSION: 1. Positive for acute PE with CTevidence of right heart strain (RV/LV Ratio = 1.3) consistent with at least submassive (intermediate risk) PE. The presence of right heart strain has been associated with an increased risk of morbidity and mortality. 2. Coronary artery and aortic calcific atherosclerosis. Aortic Atherosclerosis (ICD10-I70.0). 3. Indeterminate left kidney upper pole 24 mm mass. Further characterization with renal protocol CT or MRI with and without contrast is recommended on a nonemergent basis. 4. 17 mm right adrenal nodule measuring -45 HU, possibly a myelolipoma or adenoma with volume averaging. These results were called by telephone at the time of interpretation on 12/03/2018 at 7:29 pm to Dr. Virgel Manifold , who verbally acknowledged these results. Electronically Signed   By: Kristine Garbe M.D.   On: 12/03/2018 19:31   Vas [place] Lower Extremity Venous (dvt)  Result Date: 12/03/2018  Lower Venous Study Indications: Swelling.   Performing Technologist: Oliver Hum RVT  Examination Guidelines: A complete evaluation includes B-mode imaging, spectral Doppler, color Doppler, and power Doppler as needed of all accessible portions of each vessel. Bilateral testing is considered an integral part of a complete examination. Limited examinations for reoccurring indications may be performed as noted.  Right Venous Findings: +---------+---------------+---------+-----------+----------+-------+          CompressibilityPhasicitySpontaneityPropertiesSummary +---------+---------------+---------+-----------+----------+-------+ CFV      Full           Yes      Yes                          +---------+---------------+---------+-----------+----------+-------+ SFJ      Full                                                 +---------+---------------+---------+-----------+----------+-------+ FV Prox  Full                                                 +---------+---------------+---------+-----------+----------+-------+ FV Mid   Full                                                 +---------+---------------+---------+-----------+----------+-------+ FV DistalFull                                                 +---------+---------------+---------+-----------+----------+-------+ PFV      Full                                                 +---------+---------------+---------+-----------+----------+-------+  POP      Partial        Yes      Yes                  Acute   +---------+---------------+---------+-----------+----------+-------+ PTV      Full                                                 +---------+---------------+---------+-----------+----------+-------+ PERO     Full                                                 +---------+---------------+---------+-----------+----------+-------+ Gastroc  Partial                                      Acute    +---------+---------------+---------+-----------+----------+-------+  Left Venous Findings: +---+---------------+---------+-----------+----------+-------+    CompressibilityPhasicitySpontaneityPropertiesSummary +---+---------------+---------+-----------+----------+-------+ CFVFull           Yes      Yes                          +---+---------------+---------+-----------+----------+-------+    Summary: Right: Findings consistent with acute deep vein thrombosis involving the right popliteal vein, and right gastrocnemius vein. No cystic structure found in the popliteal fossa. Left: No evidence of common femoral vein obstruction.  *See table(s) above for measurements and observations.    Preliminary    EKG Interpretation  Date/Time:  Thursday December 03 2018 15:31:10 EST Ventricular Rate:  85 PR Interval:    QRS Duration: 109 QT Interval:  334 QTC Calculation: 398 R Axis:   -61 Text Interpretation:  Sinus rhythm Left anterior fascicular block Abnormal R-wave progression, late transition Borderline T wave abnormalities Confirmed by Virgel Manifold [phone]) on 12/03/2018 4:04:27 PM   Pending Labs Unresulted Labs (From admission, onward)    Start     Ordered   12/04/18 0500  CBC  Tomorrow morning,   R     12/03/18 1949   12/04/18 7341  Basic metabolic panel  Tomorrow morning,   R     12/03/18 1949          Vitals/Pain Today's Vitals   12/03/18 1830 12/03/18 1928 12/03/18 1930 12/03/18 2000  BP: (!) 142/75 130/68 (!) 148/74 117/76  Pulse: 85 74 77 70  Resp: (!) 29 20 (!) 29 17  Temp:      TempSrc:      SpO2: 95% 93% 93% 92%  Weight:      Height:      PainSc:        Isolation Precautions No active isolations  Medications Medications  sodium chloride (PF) 0.9 % injection (has no administration in time range)  acetaminophen (TYLENOL) tablet 650 mg (has no administration in time range)    Or  acetaminophen (TYLENOL) suppository 650 mg (has no administration in time  range)  ondansetron (ZOFRAN) tablet 4 mg (has no administration in time range)    Or  ondansetron (ZOFRAN) injection 4 mg (has no administration in time range)  amLODipine (NORVASC) tablet 5 mg (has no administration in time range)  atorvastatin (LIPITOR) tablet 20  mg (has no administration in time range)  brimonidine-timolol (COMBIGAN) 0.2-0.5 % ophthalmic solution 1 drop (has no administration in time range)  losartan-hydrochlorothiazide (HYZAAR) 100-25 MG per tablet 1 tablet (has no administration in time range)  multivitamin-lutein (OCUVITE-LUTEIN) capsule 1 capsule (has no administration in time range)  cholecalciferol (VITAMIN D3) tablet 1,000 Units (has no administration in time range)  Rivaroxaban (XARELTO) tablet 15 mg (has no administration in time range)    Followed by  rivaroxaban (XARELTO) tablet 20 mg (has no administration in time range)  enoxaparin (LOVENOX) injection 100 mg (100 mg Subcutaneous Given 12/03/18 1755)  iopamidol (ISOVUE-370) 76 % injection 100 mL (100 mLs Intravenous Contrast Given 12/03/18 1845)    Mobility non-ambulatory

## 2018-12-03 NOTE — ED Triage Notes (Signed)
Patient states she began having SOB last week and then she went to her PCP 2 days ago where she had an EKG and a CXR  In which the patient states, "They did not find anything wrong." Patient states the SOB followed an episode of abdominal bloating and belching and states she was prescribed Miralax and Protonix.  patient states the SOB is worse today then previously. Sats currently 93% in triage.

## 2018-12-03 NOTE — ED Notes (Signed)
Patient transported to X-ray 

## 2018-12-03 NOTE — Progress Notes (Signed)
ANTICOAGULATION CONSULT NOTE - Initial Consult  Pharmacy Consult for xarelto Indication: pulmonary embolus  No Known Allergies  Patient Measurements: Height: 5\' 6"  (167.6 cm) Weight: 220 lb (99.8 kg) IBW/kg (Calculated) : 59.3 Heparin Dosing Weight: 81.8 kg  Vital Signs: Temp: 97.7 F (36.5 C) (02/20 1531) Temp Source: Oral (02/20 1531) BP: 148/74 (02/20 1930) Pulse Rate: 77 (02/20 1930)  Labs: Recent Labs    12/03/18 1631  HGB 13.0  HCT 40.4  PLT 162  CREATININE 0.80  TROPONINI 0.24*    Estimated Creatinine Clearance: 66.8 mL/min (by C-G formula based on SCr of 0.8 mg/dL).   Medical History: Past Medical History:  Diagnosis Date  . Arthritis    oa and pain left knee;  previous right total knee replacement  1998  . GERD (gastroesophageal reflux disease)    seldom  . H/O cardiovascular stress test 05/27/12   STRESS TEST WAS DONE for cardiac clearance for left total knee replacement--given clearance by dr. Einar Gip.   EF 71%, NO ISCHEMIA  . Hyperlipidemia   . Hypertension   . Shortness of breath    with exertion    Assessment: 83 yo F with RLE DVT and acute PE with R heart strain.  LMWH 1 mg/kg = 100 mg given today at 1800.  Pharmacy consulted to dose Xarelto if pt has no bleeding or complications overnight.  Hg 13, PTLC 162. SCr 0.8. Wt 99.8 kg  Plan:  If no bleeding or complications overnight start Xarelto 15 mg po bid with food x 21 days then transition to xarelto 20 mg qsupper Will provide education and 30 day free card prior to discharge  Eudelia Bunch, Pharm.D [phone] 12/03/2018 8:02 PM

## 2018-12-03 NOTE — H&P (Signed)
History and Physical    Kaitlin Arnold WPY:[id number] DOB: 04/13/1936 DOA: 12/03/2018  PCP: System, Pcp Not In  Patient coming from: Home  I have personally briefly reviewed patient's old medical records in [place]  Chief Complaint: SOB  HPI: Kaitlin Arnold is a 83 y.o. female with medical history significant of HTN, HLD.  Patient presents to the ED with c/o SOB and DOE that has been progressively worsening over the past week.  Initially noticed it going up steps.  Now very short of breath even ambulating short distances.  Occasional non productive cough, has orthopnea.  No peripheral edema reported.   ED Course: Work up in ED reveals B PEs with R heart strain, RLE popliteal DVT, and incidental finding of 2.4 cm indeterminate L renal mass.   Review of Systems: As per HPI otherwise 10 point review of systems negative.   Past Medical History:  Diagnosis Date  . Arthritis    oa and pain left knee;  previous right total knee replacement  1998  . GERD (gastroesophageal reflux disease)    seldom  . H/O cardiovascular stress test 05/27/12   STRESS TEST WAS DONE for cardiac clearance for left total knee replacement--given clearance by dr. Einar Gip.   EF 71%, NO ISCHEMIA  . Hyperlipidemia   . Hypertension   . Shortness of breath    with exertion    Past Surgical History:  Procedure Laterality Date  . EYE SURGERY    . HAND SURGERY Bilateral   . HERNIA REPAIR     38 yrs ago  . JOINT REPLACEMENT  1998   right total knee replacement  . TOTAL KNEE ARTHROPLASTY  06/09/2012   Procedure: TOTAL KNEE ARTHROPLASTY;  Surgeon: Mauri Pole, MD;  Location: [facility];  Service: Orthopedics;  Laterality: Left;     reports that she has been smoking cigarettes. She has smoked for the past 40.00 years. She has never used smokeless tobacco. She reports that she does not drink alcohol or use drugs.  No Known Allergies  Family History  Problem Relation Age of Onset  . Diabetes Father       Prior to Admission medications   Medication Sig Start Date End Date Taking? Authorizing Provider  amLODipine (NORVASC) 5 MG tablet Take 5 mg by mouth every morning.   Yes [provider]  atorvastatin (LIPITOR) 20 MG tablet Take 20 mg by mouth every evening. 11/24/18  Yes [provider]  cholecalciferol (VITAMIN D3) 25 MCG (1000 UT) tablet Take 1,000 Units by mouth daily.   Yes [provider]  COMBIGAN 0.2-0.5 % ophthalmic solution Place 1 drop into both eyes every 12 (twelve) hours.  10/12/18  Yes [provider]  losartan-hydrochlorothiazide (HYZAAR) 100-25 MG tablet Take 1 tablet by mouth daily.   Yes [provider]  multivitamin-lutein (OCUVITE-LUTEIN) CAPS capsule Take 1 capsule by mouth daily.   Yes [provider]  diphenhydrAMINE (BENADRYL) 25 mg capsule Take 1 capsule (25 mg total) by mouth every 6 (six) hours as needed for itching, allergies or sleep. 06/11/12 06/21/12  Danae Orleans, PA-C  ferrous sulfate 325 (65 FE) MG tablet Take 1 tablet (325 mg total) by mouth 3 (three) times daily after meals. 06/11/12 06/11/13  Danae Orleans, PA-C  triamcinolone cream (KENALOG) 0.1 % Apply 1 application topically 2 (two) times daily as needed (dermatitis).  11/10/18   [provider]    Physical Exam: Vitals:   12/03/18 1830 12/03/18 1928 12/03/18 1930  12/03/18 2000  BP: (!) 142/75 130/68 (!) 148/74 117/76  Pulse: 85 74 77 70  Resp: (!) 29 20 (!) 29 17  Temp:      TempSrc:      SpO2: 95% 93% 93% 92%  Weight:      Height:        Constitutional: NAD, calm, comfortable Eyes: PERRL, lids and conjunctivae normal ENMT: Mucous membranes are moist. Posterior pharynx clear of any exudate or lesions.Normal dentition.  Neck: normal, supple, no masses, no thyromegaly Respiratory: clear to auscultation bilaterally, no wheezing, no crackles. Normal respiratory effort. No accessory muscle use.  Cardiovascular: Regular rate and  rhythm, no murmurs / rubs / gallops. No extremity edema. 2+ pedal pulses. No carotid bruits.  Abdomen: no tenderness, no masses palpated. No hepatosplenomegaly. Bowel sounds positive.  Musculoskeletal: no clubbing / cyanosis. No joint deformity upper and lower extremities. Good ROM, no contractures. Normal muscle tone.  Skin: no rashes, lesions, ulcers. No induration Neurologic: CN 2-12 grossly intact. Sensation intact, DTR normal. Strength 5/5 in all 4.  Psychiatric: Normal judgment and insight. Alert and oriented x 3. Normal mood.    Labs on Admission: I have personally reviewed following labs and imaging studies  CBC: Recent Labs  Lab 12/03/18 1631  WBC 7.3  NEUTROABS 3.7  HGB 13.0  HCT 40.4  MCV 87.3  PLT 948   Basic Metabolic Panel: Recent Labs  Lab 12/03/18 1631  NA 139  K 3.6  CL 105  CO2 26  GLUCOSE 109*  BUN 16  CREATININE 0.80  CALCIUM 9.4   GFR: Estimated Creatinine Clearance: 66.8 mL/min (by C-G formula based on SCr of 0.8 mg/dL). Liver Function Tests: Recent Labs  Lab 12/03/18 1631  AST 14*  ALT 17  ALKPHOS 98  BILITOT 0.6  PROT 7.2  ALBUMIN 4.2   No results for input(s): LIPASE, AMYLASE in the last 168 hours. No results for input(s): AMMONIA in the last 168 hours. Coagulation Profile: No results for input(s): INR, PROTIME in the last 168 hours. Cardiac Enzymes: Recent Labs  Lab 12/03/18 1631  TROPONINI 0.24*   BNP (last 3 results) No results for input(s): PROBNP in the last 8760 hours. HbA1C: No results for input(s): HGBA1C in the last 72 hours. CBG: No results for input(s): GLUCAP in the last 168 hours. Lipid Profile: No results for input(s): CHOL, HDL, LDLCALC, TRIG, CHOLHDL, LDLDIRECT in the last 72 hours. Thyroid Function Tests: No results for input(s): TSH, T4TOTAL, FREET4, T3FREE, THYROIDAB in the last 72 hours. Anemia Panel: No results for input(s): VITAMINB12, FOLATE, FERRITIN, TIBC, IRON, RETICCTPCT in the last 72  hours. Urine analysis:    Component Value Date/Time   COLORURINE RED (A) 06/10/2012 1151   APPEARANCEUR TURBID (A) 06/10/2012 1151   LABSPEC 1.035 (H) 06/10/2012 1151   PHURINE 6.0 06/10/2012 1151   GLUCOSEU NEGATIVE 06/10/2012 1151   HGBUR LARGE (A) 06/10/2012 1151   BILIRUBINUR NEGATIVE 06/10/2012 1151   KETONESUR NEGATIVE 06/10/2012 1151   PROTEINUR 100 (A) 06/10/2012 1151   UROBILINOGEN 0.2 06/10/2012 1151   NITRITE NEGATIVE 06/10/2012 1151   LEUKOCYTESUR SMALL (A) 06/10/2012 1151    Radiological Exams on Admission: Dg Chest 2 View  Result Date: 12/03/2018 CLINICAL DATA:  Shortness of breath. EXAM: CHEST - 2 VIEW COMPARISON:  06/02/2012 FINDINGS: Enlarged cardiac silhouette. Mediastinal contours appear intact. There is no evidence of focal airspace consolidation, pleural effusion or pneumothorax. Pulmonary vascular congestion. Osseous structures are without acute abnormality. Soft tissues are grossly normal.  IMPRESSION: Enlarged cardiac silhouette with pulmonary vascular congestion. Electronically Signed   By: Fidela Salisbury M.D.   On: 12/03/2018 17:10   Ct Angio Chest Pe W And/or Wo Contrast  Result Date: 12/03/2018 CLINICAL DATA:  83 y/o  F; 3 weeks of shortness of breath. EXAM: CT ANGIOGRAPHY CHEST WITH CONTRAST TECHNIQUE: Multidetector CT imaging of the chest was performed using the standard protocol during bolus administration of intravenous contrast. Multiplanar CT image reconstructions and MIPs were obtained to evaluate the vascular anatomy. CONTRAST:  129mL ISOVUE-370 IOPAMIDOL (ISOVUE-370) INJECTION 76% COMPARISON:  12/03/2018 chest radiograph FINDINGS: Cardiovascular: Bilateral acute lobar, segmental, and small vessel pulmonary emboli. RV/LV = 1.3. Mild cardiomegaly. No pericardial effusion. Coronary artery and aortic calcific atherosclerosis. Mediastinum/Nodes: No enlarged mediastinal, hilar, or axillary lymph nodes. Thyroid gland, trachea, and esophagus demonstrate no  significant findings. Lungs/Pleura: Lungs are clear. No pleural effusion or pneumothorax. Upper Abdomen: 17 mm right adrenal nodule measuring -45 HU, possibly a myelolipoma or adenoma with volume averaging. Left kidney nodule measuring 21 mm and 83 HU compatible with hemorrhagic cyst. Left kidney upper pole 73 mm simple cyst. Left kidney upper pole indeterminate 24 mm mass measuring 59 HU. Musculoskeletal: No acute finding. Review of the MIP images confirms the above findings. IMPRESSION: 1. Positive for acute PE with CTevidence of right heart strain (RV/LV Ratio = 1.3) consistent with at least submassive (intermediate risk) PE. The presence of right heart strain has been associated with an increased risk of morbidity and mortality. 2. Coronary artery and aortic calcific atherosclerosis. Aortic Atherosclerosis (ICD10-I70.0). 3. Indeterminate left kidney upper pole 24 mm mass. Further characterization with renal protocol CT or MRI with and without contrast is recommended on a nonemergent basis. 4. 17 mm right adrenal nodule measuring -45 HU, possibly a myelolipoma or adenoma with volume averaging. These results were called by telephone at the time of interpretation on 12/03/2018 at 7:29 pm to Dr. Virgel Manifold , who verbally acknowledged these results. Electronically Signed   By: Kristine Garbe M.D.   On: 12/03/2018 19:31   Vas [place] Lower Extremity Venous (dvt)  Result Date: 12/03/2018  Lower Venous Study Indications: Swelling.  Performing Technologist: Oliver Hum RVT  Examination Guidelines: A complete evaluation includes B-mode imaging, spectral Doppler, color Doppler, and power Doppler as needed of all accessible portions of each vessel. Bilateral testing is considered an integral part of a complete examination. Limited examinations for reoccurring indications may be performed as noted.  Right Venous Findings: +---------+---------------+---------+-----------+----------+-------+           CompressibilityPhasicitySpontaneityPropertiesSummary +---------+---------------+---------+-----------+----------+-------+ CFV      Full           Yes      Yes                          +---------+---------------+---------+-----------+----------+-------+ SFJ      Full                                                 +---------+---------------+---------+-----------+----------+-------+ FV Prox  Full                                                 +---------+---------------+---------+-----------+----------+-------+ FV Mid  Full                                                 +---------+---------------+---------+-----------+----------+-------+ FV DistalFull                                                 +---------+---------------+---------+-----------+----------+-------+ PFV      Full                                                 +---------+---------------+---------+-----------+----------+-------+ POP      Partial        Yes      Yes                  Acute   +---------+---------------+---------+-----------+----------+-------+ PTV      Full                                                 +---------+---------------+---------+-----------+----------+-------+ PERO     Full                                                 +---------+---------------+---------+-----------+----------+-------+ Gastroc  Partial                                      Acute   +---------+---------------+---------+-----------+----------+-------+  Left Venous Findings: +---+---------------+---------+-----------+----------+-------+    CompressibilityPhasicitySpontaneityPropertiesSummary +---+---------------+---------+-----------+----------+-------+ CFVFull           Yes      Yes                          +---+---------------+---------+-----------+----------+-------+    Summary: Right: Findings consistent with acute deep vein thrombosis involving the right popliteal  vein, and right gastrocnemius vein. No cystic structure found in the popliteal fossa. Left: No evidence of common femoral vein obstruction.  *See table(s) above for measurements and observations.    Preliminary     EKG: Independently reviewed.  Assessment/Plan Principal Problem:   Acute pulmonary embolism with acute cor pulmonale (HCC) Active Problems:   Left kidney mass   Acute deep vein thrombosis (DVT) of right popliteal vein ([place])    1. Acute PE - also acute DVT of R popliteal and gastrocnemius vein. 1. Full dose lovenox already given in ED 2. If no bleeding complications overnight, plan to switch this to Xarelto in AM 3. Tele monitor 4. 2d echo 5. Serial trops - though fairly confident the trop of 0.24 represents R heart strain from PE. 6. Check ambulatory pulse ox tomorrow, satting mid to low 90s on RA while in bed. 2. L renal mass - 1. Needs follow up MRI or CT kidney protocol on non-emergent basis 3. HTN - 1. Continue home BP meds  4. HLD - continue lipitor  DVT prophylaxis: Full dose anticoagulation as discussed above Code Status: Full Family Communication: Family at bedside Disposition Plan: Home after admit Consults called: None Admission status: Place in obs - convert to IP if not discharged tomorrow     Alcario Drought, [facility]  How to contact the [facility] Attending or Consulting provider [place] or covering provider during after hours [place], for this patient?  1. Check the care team in [facility] and look for a) attending/consulting [facility] provider listed and b) the [facility] team listed 2. Log into [email or web address]  Amion Physician Scheduling and messaging for groups and whole hospitals  On call and physician scheduling software for group practices, residents, hospitalists and other medical providers for call, clinic, rotation and shift schedules. On[employer] is a hospital-wide system for scheduling doctors and paging doctors on call. EasyPlot is for scientific  plotting and data analysis.  [email or web address]  and use [place]'s universal password to access. If you do not have the password, please contact the hospital operator.  3. Locate the [facility] provider you are looking for under [facility] and page to a number that you can be directly reached. 4. If you still have difficulty reaching the provider, please page the [facility] (Director on Call) for the Hospitalists listed on amion for assistance.  12/03/2018, 8:25 PM

## 2018-12-03 NOTE — ED Provider Notes (Signed)
[facility] DEPT Provider Note   CSN: [id number] Arrival date & time: 12/03/18  1520    History   Chief Complaint Chief Complaint  Patient presents with  . Shortness of Breath    HPI Kaitlin Arnold is a 83 y.o. female.     HPI   83 year old female with dyspnea.  Onset about a week ago.  Initially noticed that she was unusually short of breath going up steps.  Now to the point that she feels short of breath with this ambulating relatively short distances.  Denies associated pain.  Occasional nonproductive cough.  Positive orthopnea.  No appreciable swelling.  No fevers or chills.  No bright red blood per rectum or melena.  Denies any known cardiac or significant pulmonary history.  Has had some abdominal bloating and belching recently.  She was started on a PPI for this.  Those symptoms have improved but the dyspnea has not.   Past Medical History:  Diagnosis Date  . Arthritis    oa and pain left knee;  previous right total knee replacement  1998  . GERD (gastroesophageal reflux disease)    seldom  . H/O cardiovascular stress test 05/27/12   STRESS TEST WAS DONE for cardiac clearance for left total knee replacement--given clearance by dr. Einar Gip.   EF 71%, NO ISCHEMIA  . Hyperlipidemia   . Hypertension   . Shortness of breath    with exertion    Patient Active Problem List   Diagnosis Date Noted  . Expected blood loss anemia 06/10/2012  . UTI (urinary tract infection) 06/10/2012  . S/P left TKA 06/09/2012    Past Surgical History:  Procedure Laterality Date  . EYE SURGERY    . HAND SURGERY Bilateral   . HERNIA REPAIR     38 yrs ago  . JOINT REPLACEMENT  1998   right total knee replacement  . TOTAL KNEE ARTHROPLASTY  06/09/2012   Procedure: TOTAL KNEE ARTHROPLASTY;  Surgeon: Mauri Pole, MD;  Location: [facility] ORS;  Service: Orthopedics;  Laterality: Left;     OB History   No obstetric history on file.      Home Medications     Prior to Admission medications   Medication Sig Start Date End Date Taking? Authorizing Provider  amLODipine (NORVASC) 5 MG tablet Take 5 mg by mouth every morning.    [provider]  diphenhydrAMINE (BENADRYL) 25 mg capsule Take 1 capsule (25 mg total) by mouth every 6 (six) hours as needed for itching, allergies or sleep. 06/11/12 06/21/12  Danae Orleans, PA-C  ferrous sulfate 325 (65 FE) MG tablet Take 1 tablet (325 mg total) by mouth 3 (three) times daily after meals. 06/11/12 06/11/13  Danae Orleans, PA-C  losartan (COZAAR) 25 MG tablet Take 25 mg by mouth at bedtime.    [provider]  losartan-hydrochlorothiazide (HYZAAR) 100-25 MG per tablet Take 1 tablet by mouth daily. Takes in afternoon    [provider]  simvastatin (ZOCOR) 20 MG tablet Take 20 mg by mouth every evening.    [provider]    Family History Family History  Problem Relation Age of Onset  . Diabetes Father     Social History Social History   Tobacco Use  . Smoking status: Current Some Day Smoker    Years: 40.00    Types: Cigarettes  . Smokeless tobacco: Never Used  . Tobacco comment: not everyday smoker--if smoking 4 to 5 cigs a day  Substance  Use Topics  . Alcohol use: No  . Drug use: No     Allergies   Patient has no known allergies.   Review of Systems Review of Systems All systems reviewed and negative, other than as noted in HPI.   Physical Exam Updated Vital Signs BP (!) 157/77 (BP Location: Left Arm)   Pulse 84   Temp 97.7 F (36.5 C) (Oral)   Resp (!) 24   Ht 5\' 6"  (1.676 m)   Wt 99.8 kg   SpO2 93%   BMI 35.51 kg/m   Physical Exam Vitals signs and nursing note reviewed.  Constitutional:      General: She is not in acute distress.    Appearance: She is well-developed.  HENT:     Head: Normocephalic and atraumatic.  Eyes:     General:        Right eye: No discharge.        Left eye: No discharge.     Conjunctiva/sclera:  Conjunctivae normal.  Neck:     Musculoskeletal: Neck supple.  Cardiovascular:     Rate and Rhythm: Normal rate and regular rhythm.     Heart sounds: Normal heart sounds. No murmur. No friction rub. No gallop.   Pulmonary:     Effort: Pulmonary effort is normal. No respiratory distress.     Breath sounds: Normal breath sounds.  Abdominal:     General: There is no distension.     Palpations: Abdomen is soft.     Tenderness: There is no abdominal tenderness.  Musculoskeletal:        General: No tenderness.     Comments: RLE mildly enlarged as compared to L. No calf tenderness. Neg homans.   Skin:    General: Skin is warm and dry.  Neurological:     Mental Status: She is alert.  Psychiatric:        Behavior: Behavior normal.        Thought Content: Thought content normal.      ED Treatments / Results  Labs (all labs ordered are listed, but only abnormal results are displayed) Labs Reviewed  TROPONIN I - Abnormal; Notable for the following components:      Result Value   Troponin I 0.24 (*)    All other components within normal limits  COMPREHENSIVE METABOLIC PANEL - Abnormal; Notable for the following components:   Glucose, Bld 109 (*)    AST 14 (*)    All other components within normal limits  D-DIMER, QUANTITATIVE (NOT AT [facility]) - Abnormal; Notable for the following components:   D-Dimer, Quant >20.00 (*)    All other components within normal limits  BRAIN NATRIURETIC PEPTIDE  CBC WITH DIFFERENTIAL/PLATELET  CBC  BASIC METABOLIC PANEL    EKG EKG Interpretation  Date/Time:  Thursday December 03 2018 15:31:10 EST Ventricular Rate:  85 PR Interval:    QRS Duration: 109 QT Interval:  334 QTC Calculation: 398 R Axis:   -61 Text Interpretation:  Sinus rhythm Left anterior fascicular block Abnormal R-wave progression, late transition Borderline T wave abnormalities Confirmed by Virgel Manifold [phone]) on 12/03/2018 4:04:27 PM   Radiology Dg Chest 2 View  Result  Date: 12/03/2018 CLINICAL DATA:  Shortness of breath. EXAM: CHEST - 2 VIEW COMPARISON:  06/02/2012 FINDINGS: Enlarged cardiac silhouette. Mediastinal contours appear intact. There is no evidence of focal airspace consolidation, pleural effusion or pneumothorax. Pulmonary vascular congestion. Osseous structures are without acute abnormality. Soft tissues are grossly normal. IMPRESSION: Enlarged cardiac  silhouette with pulmonary vascular congestion. Electronically Signed   By: Fidela Salisbury M.D.   On: 12/03/2018 17:10   Ct Angio Chest Pe W And/or Wo Contrast  Result Date: 12/03/2018 CLINICAL DATA:  83 y/o  F; 3 weeks of shortness of breath. EXAM: CT ANGIOGRAPHY CHEST WITH CONTRAST TECHNIQUE: Multidetector CT imaging of the chest was performed using the standard protocol during bolus administration of intravenous contrast. Multiplanar CT image reconstructions and MIPs were obtained to evaluate the vascular anatomy. CONTRAST:  158mL ISOVUE-370 IOPAMIDOL (ISOVUE-370) INJECTION 76% COMPARISON:  12/03/2018 chest radiograph FINDINGS: Cardiovascular: Bilateral acute lobar, segmental, and small vessel pulmonary emboli. RV/LV = 1.3. Mild cardiomegaly. No pericardial effusion. Coronary artery and aortic calcific atherosclerosis. Mediastinum/Nodes: No enlarged mediastinal, hilar, or axillary lymph nodes. Thyroid gland, trachea, and esophagus demonstrate no significant findings. Lungs/Pleura: Lungs are clear. No pleural effusion or pneumothorax. Upper Abdomen: 17 mm right adrenal nodule measuring -45 HU, possibly a myelolipoma or adenoma with volume averaging. Left kidney nodule measuring 21 mm and 83 HU compatible with hemorrhagic cyst. Left kidney upper pole 73 mm simple cyst. Left kidney upper pole indeterminate 24 mm mass measuring 59 HU. Musculoskeletal: No acute finding. Review of the MIP images confirms the above findings. IMPRESSION: 1. Positive for acute PE with CTevidence of right heart strain (RV/LV Ratio =  1.3) consistent with at least submassive (intermediate risk) PE. The presence of right heart strain has been associated with an increased risk of morbidity and mortality. 2. Coronary artery and aortic calcific atherosclerosis. Aortic Atherosclerosis (ICD10-I70.0). 3. Indeterminate left kidney upper pole 24 mm mass. Further characterization with renal protocol CT or MRI with and without contrast is recommended on a nonemergent basis. 4. 17 mm right adrenal nodule measuring -45 HU, possibly a myelolipoma or adenoma with volume averaging. These results were called by telephone at the time of interpretation on 12/03/2018 at 7:29 pm to Dr. Virgel Manifold , who verbally acknowledged these results. Electronically Signed   By: Kristine Garbe M.D.   On: 12/03/2018 19:31   Vas [place] Lower Extremity Venous (dvt)  Result Date: 12/03/2018  Lower Venous Study Indications: Swelling.  Performing Technologist: Oliver Hum RVT  Examination Guidelines: A complete evaluation includes B-mode imaging, spectral Doppler, color Doppler, and power Doppler as needed of all accessible portions of each vessel. Bilateral testing is considered an integral part of a complete examination. Limited examinations for reoccurring indications may be performed as noted.  Right Venous Findings: +---------+---------------+---------+-----------+----------+-------+          CompressibilityPhasicitySpontaneityPropertiesSummary +---------+---------------+---------+-----------+----------+-------+ CFV      Full           Yes      Yes                          +---------+---------------+---------+-----------+----------+-------+ SFJ      Full                                                 +---------+---------------+---------+-----------+----------+-------+ FV Prox  Full                                                 +---------+---------------+---------+-----------+----------+-------+ FV Mid   Full                                                  +---------+---------------+---------+-----------+----------+-------+  FV DistalFull                                                 +---------+---------------+---------+-----------+----------+-------+ PFV      Full                                                 +---------+---------------+---------+-----------+----------+-------+ POP      Partial        Yes      Yes                  Acute   +---------+---------------+---------+-----------+----------+-------+ PTV      Full                                                 +---------+---------------+---------+-----------+----------+-------+ PERO     Full                                                 +---------+---------------+---------+-----------+----------+-------+ Gastroc  Partial                                      Acute   +---------+---------------+---------+-----------+----------+-------+  Left Venous Findings: +---+---------------+---------+-----------+----------+-------+    CompressibilityPhasicitySpontaneityPropertiesSummary +---+---------------+---------+-----------+----------+-------+ CFVFull           Yes      Yes                          +---+---------------+---------+-----------+----------+-------+    Summary: Right: Findings consistent with acute deep vein thrombosis involving the right popliteal vein, and right gastrocnemius vein. No cystic structure found in the popliteal fossa. Left: No evidence of common femoral vein obstruction.  *See table(s) above for measurements and observations.    Preliminary     Procedures Procedures (including critical care time)  CRITICAL CARE Performed by: Virgel Manifold Total critical care time: 40 minutes Critical care time was exclusive of separately billable procedures and treating other patients. Critical care was necessary to treat or prevent imminent or life-threatening deterioration. Critical care was time spent  personally by me on the following activities: development of treatment plan with patient and/or surrogate as well as nursing, discussions with consultants, evaluation of patient's response to treatment, examination of patient, obtaining history from patient or surrogate, ordering and performing treatments and interventions, ordering and review of laboratory studies, ordering and review of radiographic studies, pulse oximetry and re-evaluation of patient's condition.   Medications Ordered in ED Medications - No data to display   Initial Impression / Assessment and Plan / ED Course  I have reviewed the triage vital signs and the nursing notes.  Pertinent labs & imaging results that were available during my care of the patient were reviewed by me and considered in my medical decision making (see chart for details).       82yF with dyspnea. CT w/ b/l  PE with CT evidence of R heart strain. Troponin mildly elevated. She is not hypotensive. o2 sats >90% on RA at least at rest. Some swelling in RLE with [place] positive for DVT. Unprovoked? Lovenox ordered in the ED. Discussed with medicine service for admission. Briefly spoke with one of her daughters who is an Administrator, [employer] in [place] and discussed findings.   Final Clinical Impressions(s) / ED Diagnoses   Final diagnoses:  Acute pulmonary embolism with acute cor pulmonale, unspecified pulmonary embolism type ([place])  Acute thromboembolism of deep veins of right lower extremity [facility])    ED Discharge Orders    None       Virgel Manifold, MD 12/03/18 2000

## 2018-12-04 ENCOUNTER — Observation Stay (HOSPITAL_BASED_OUTPATIENT_CLINIC_OR_DEPARTMENT_OTHER): Payer: Medicare Other

## 2018-12-04 ENCOUNTER — Encounter (HOSPITAL_COMMUNITY): Payer: Self-pay | Admitting: Family Medicine

## 2018-12-04 DIAGNOSIS — N2889 Other specified disorders of kidney and ureter: Secondary | ICD-10-CM | POA: Diagnosis not present

## 2018-12-04 DIAGNOSIS — I361 Nonrheumatic tricuspid (valve) insufficiency: Secondary | ICD-10-CM

## 2018-12-04 DIAGNOSIS — I700 Atherosclerosis of aorta: Secondary | ICD-10-CM

## 2018-12-04 DIAGNOSIS — I2510 Atherosclerotic heart disease of native coronary artery without angina pectoris: Secondary | ICD-10-CM

## 2018-12-04 DIAGNOSIS — I82431 Acute embolism and thrombosis of right popliteal vein: Secondary | ICD-10-CM | POA: Diagnosis not present

## 2018-12-04 DIAGNOSIS — I2699 Other pulmonary embolism without acute cor pulmonale: Secondary | ICD-10-CM | POA: Diagnosis not present

## 2018-12-04 DIAGNOSIS — I2609 Other pulmonary embolism with acute cor pulmonale: Secondary | ICD-10-CM | POA: Diagnosis not present

## 2018-12-04 HISTORY — DX: Atherosclerosis of aorta: I70.0

## 2018-12-04 HISTORY — DX: Atherosclerotic heart disease of native coronary artery without angina pectoris: I25.10

## 2018-12-04 LAB — CBC
HCT: 39.2 % (ref 36.0–46.0)
Hemoglobin: 12.5 g/dL (ref 12.0–15.0)
MCH: 27.8 pg (ref 26.0–34.0)
MCHC: 31.9 g/dL (ref 30.0–36.0)
MCV: 87.1 fL (ref 80.0–100.0)
NRBC: 0 % (ref 0.0–0.2)
Platelets: 150 10*3/uL (ref 150–400)
RBC: 4.5 MIL/uL (ref 3.87–5.11)
RDW: 14.1 % (ref 11.5–15.5)
WBC: 9 10*3/uL (ref 4.0–10.5)

## 2018-12-04 LAB — ECHOCARDIOGRAM COMPLETE
Height: 66 in
Weight: 3412.8 oz

## 2018-12-04 LAB — BASIC METABOLIC PANEL
ANION GAP: 8 (ref 5–15)
BUN: 16 mg/dL (ref 8–23)
CO2: 24 mmol/L (ref 22–32)
Calcium: 9.3 mg/dL (ref 8.9–10.3)
Chloride: 107 mmol/L (ref 98–111)
Creatinine, Ser: 0.78 mg/dL (ref 0.44–1.00)
GFR calc Af Amer: >60 mL/min (ref >60)
GFR calc non Af Amer: >60 mL/min (ref >60)
Glucose, Bld: 104 mg/dL — ABNORMAL HIGH (ref 70–99)
Potassium: 3.3 mmol/L — ABNORMAL LOW (ref 3.5–5.1)
Sodium: 139 mmol/L (ref 135–145)

## 2018-12-04 LAB — TROPONIN I
Troponin I: 0.13 ng/mL (ref <0.03)
Troponin I: 0.09 ng/mL (ref <0.03)

## 2018-12-04 MED ORDER — POTASSIUM CHLORIDE CRYS ER 20 MEQ PO TBCR
40.0000 meq | EXTENDED_RELEASE_TABLET | Freq: Once | ORAL | Status: AC
  Administered 2018-12-04: 40 meq via ORAL
  Filled 2018-12-04: qty 2

## 2018-12-04 NOTE — Progress Notes (Signed)
Attempted echo.  Patient taking bath.  Will attempt at a later time.

## 2018-12-04 NOTE — Progress Notes (Signed)
ANTICOAGULATION CONSULT NOTE - Consult  Pharmacy Consult for xarelto Indication: pulmonary embolus  No Known Allergies  Patient Measurements: Height: 5\' 6"  (167.6 cm) Weight: 213 lb 4.8 oz (96.8 kg) IBW/kg (Calculated) : 59.3 Heparin Dosing Weight: 81.8 kg  Vital Signs: Temp: 98.2 F (36.8 C) (02/21 0504) Temp Source: Oral (02/21 0504) BP: 137/73 (02/21 0504) Pulse Rate: 72 (02/21 0504)  Labs: Recent Labs    12/03/18 1631 12/03/18 2214 12/04/18 0215 12/04/18 0838  HGB 13.0  --  12.5  --   HCT 40.4  --  39.2  --   PLT 162  --  150  --   CREATININE 0.80  --  0.78  --   TROPONINI 0.24* 0.18* 0.13* 0.09*    Estimated Creatinine Clearance: 65.8 mL/min (by C-G formula based on SCr of 0.78 mg/dL).   Medical History: Past Medical History:  Diagnosis Date  . Arthritis    oa and pain left knee;  previous right total knee replacement  1998  . GERD (gastroesophageal reflux disease)    seldom  . H/O cardiovascular stress test 05/27/12   STRESS TEST WAS DONE for cardiac clearance for left total knee replacement--given clearance by dr. Einar Gip.   EF 71%, NO ISCHEMIA  . Hyperlipidemia   . Hypertension   . Shortness of breath    with exertion    Assessment: 83 yo F with RLE DVT and acute PE with R heart strain.  LMWH 1 mg/kg = 100 mg given on 2/20 at 1800.  Pharmacy consulted to dose Xarelto  Today, 12/04/2018  SCr 0.78, CrCl 65 ml/min  CBC: Hgb 12.5 (stable), Plts wnl  No bleeding reported  No drug-drug interactions noted  Plan:   Start Xarelto 15 mg po bid with food x 21 days then transition to xarelto 20 mg qsupper  Provided education and 30 day free card prior to discharge  Peggyann Juba, PharmD, BCPS Pager: [phone] 12/04/2018 10:58 AM

## 2018-12-04 NOTE — Progress Notes (Signed)
PROGRESS NOTE  RICKELL WIEHE CWC:[id number] DOB: September 08, 1936 DOA: 12/03/2018 PCP: System, Pcp Not In  Brief History   83 year old woman relatively healthy presented with shortness of breath.  Found to have acute bilateral PE with RV strain, right lower extremity DVT.  A & P  Acute bilateral PE with CT evidence of right heart strain, confirmed with mild RV dysfunction on echocardiogram, supported by mild troponin elevation, demand ischemia. --No hypoxia and is hemodynamically stable.  Started on Xarelto this morning, given clinical stability, will continue.  Discussed with pulmonology, recommend anticoagulation, no invasive intervention, follow-up with PCP in 1 week for repeat echocardiogram.  Recommend 48 hours of observation in the hospital.  Right lower extremity DVT right popliteal vein, right gastrocnemius vein --Asymptomatic, continue anticoagulation.  Indeterminate left kidney mass --Will proceed with further imaging 2/22 as the patient has no explanation for PE, need to rule out malignancy.  Right adrenal lesion --Incidental finding, possible mild low lipoma or adenoma.  Follow-up as an outpatient.  Coronary artery and aortic calcific atherosclerosis. --Asymptomatic.  Follow-up as an outpatient.  DVT prophylaxis: On Xarelto Code Status: Full Family Communication: Daughter (internal medicine physician), second daughter at bedside Disposition Plan: home    Murray Hodgkins, MD  [facility] Direct contact: see [email or web address]  7PM-7AM contact night coverage as above 12/04/2018, 5:41 PM  LOS: 0 days   Consultants  .   Procedures  . Echo IMPRESSIONS    1. The left ventricle has hyperdynamic systolic function, with an ejection fraction of >65%. The cavity size was normal. There is moderately increased left ventricular wall thickness. Left ventricular diastolic Doppler parameters are consistent with  impaired relaxation.  2. The right ventricle has mildly reduced  systolic function. The cavity was moderately enlarged. There is no increase in right ventricular wall thickness. The function of the right ventricular Mid RV free wall is hypokinetic. Right ventricular systolic  pressure is moderately elevated with an estimated pressure of 44.6 mmHg.  3. The tricuspid valve is normal in structure. Mild tricuspid valve regurgitation.  4. The pulmonic valve was normal in structure.  5. The mitral valve is normal in structure.  6. The aortic valve is normal in structure.  7. The inferior vena cava was normal in size with <50% respiratory variability.  Antibiotics  .   Interval History/Subjective  Feels ok, no chest pain, some shortness of breath with ambulation but none at rest.  No leg pain.  Objective   Vitals:  Vitals:   12/04/18 0504 12/04/18 1500  BP: 137/73 129/69  Pulse: 72 67  Resp: 18 16  Temp: 98.2 F (36.8 C) 98.7 F (37.1 C)  SpO2: 93% 94%    Exam:  Constitutional:  . Appears calm and comfortable sitting in chair Eyes:  . pupils and irises appear normal . Normal lids ENMT:  . grossly normal hearing  . Lips appear normal Respiratory:  . CTA bilaterally, no w/r/r.  . Respiratory effort normal.  Cardiovascular:  . RRR, no m/r/g . Dorsalis pedis pulses 2+ bilaterally . No significant LE extremity edema   Psychiatric:  . Mental status o Mood, affect appropriate . judgment and insight appear intact     I have personally reviewed the following:   Today's Data  . Echocardiogram noted . Potassium 3.3, remainder BMP unremarkable . Troponin trending down, last 0.09 . CBC unremarkable  Lab Data  . Other data noted  Micro Data  .   Imaging  . Chest x-ray noted . CTA  chest noted bilateral PE with RV strain, other findings . Vascular ultrasound noted  Cardiology Data  . EKG sinus rhythm, no acute changes  Other Data  .   Scheduled Meds: . amLODipine  5 mg Oral q morning - 10a  . atorvastatin  20 mg Oral QPM  .  brimonidine  1 drop Both Eyes Q12H   And  . timolol  1 drop Both Eyes Q12H  . cholecalciferol  1,000 Units Oral Daily  . losartan  100 mg Oral Daily   And  . hydrochlorothiazide  25 mg Oral Daily  . multivitamin  1 tablet Oral Daily  . rivaroxaban  15 mg Oral BID WC   Followed by  . [START ON 12/25/2018] rivaroxaban  20 mg Oral Q supper   Continuous Infusions:  Principal Problem:   Acute pulmonary embolism with acute cor pulmonale (HCC) Active Problems:   Left kidney mass   Acute deep vein thrombosis (DVT) of right popliteal vein (HCC)   Aortic atherosclerosis (HCC)   CAD (coronary artery disease)   LOS: 0 days    Time 35 minutes, greater than 50% counseling and coordination of care, discussion with family at bedside and patient, discussion with pulmonology and pharmacist.

## 2018-12-04 NOTE — Progress Notes (Signed)
  Echocardiogram 2D Echocardiogram has been performed.  Kaitlin Arnold 12/04/2018, 4:04 PM

## 2018-12-05 DIAGNOSIS — I2699 Other pulmonary embolism without acute cor pulmonale: Secondary | ICD-10-CM | POA: Diagnosis not present

## 2018-12-05 DIAGNOSIS — N2889 Other specified disorders of kidney and ureter: Secondary | ICD-10-CM | POA: Diagnosis not present

## 2018-12-05 DIAGNOSIS — I82431 Acute embolism and thrombosis of right popliteal vein: Secondary | ICD-10-CM | POA: Diagnosis not present

## 2018-12-05 DIAGNOSIS — I2609 Other pulmonary embolism with acute cor pulmonale: Secondary | ICD-10-CM | POA: Diagnosis not present

## 2018-12-05 NOTE — Progress Notes (Signed)
SATURATION QUALIFICATIONS: (This note is used to comply with regulatory documentation for home oxygen)  Patient Saturations on Room Air at Rest = 98-99%   Patient Saturations on Room Air while Ambulating = 96-98%  Patient Saturations on n/a Liters of oxygen while Ambulating =  n/a  Please briefly explain why patient needs home oxygen: patient does not qualify for home oxygen

## 2018-12-05 NOTE — Plan of Care (Signed)
Patient denies pain, tolerating heart healthy diet.  Maintains oxygen saturation in the mid to high 90's on room air even with ambulation.  Does continue to complain of some dyspnea with exertion but improved from admission.

## 2018-12-05 NOTE — Progress Notes (Signed)
PROGRESS NOTE  Kaitlin Arnold IRC:[id number] DOB: 1936-10-05 DOA: 12/03/2018 PCP: Merrilee Seashore, MD  Brief History   83 year old woman relatively healthy presented with shortness of breath.  Found to have acute bilateral PE with RV strain, right lower extremity DVT.  A & P  Acute bilateral PE with CT evidence of right heart strain, confirmed with mild RV dysfunction on echocardiogram, supported by mild troponin elevation, demand ischemia. --No hypoxia, hemodynamics remained stable.  Continue Xarelto.  Follow-up with PCP in 1 week for repeat echocardiogram.  No hypoxia and is hemodynamically stable.  Started on Xarelto this morning, given clinical stability, will continue.  Discussed with pulmonology, recommend anticoagulation, no invasive intervention, follow-up with PCP in 1 week for repeat echocardiogram.  --As per pulmonology recommendations will observe 48 hours in the hospital, anticipate discharge 2/23 in the morning.  Right lower extremity DVT right popliteal vein, right gastrocnemius vein --Remains asymptomatic, continue anticoagulation.  Indeterminate left kidney mass --Discussed in detail with patient and daughter who is an internal medicine physician at bedside.  At this point decision has been made to defer to the outpatient setting, I will coordinate with the patient's PCP on Monday.  Right adrenal lesion --Incidental finding, possible mild low lipoma or adenoma.  Follow-up as an outpatient.  Coronary artery and aortic calcific atherosclerosis. --Remains asymptomatic.  Follow-up as an outpatient.  DVT prophylaxis: Xarelto Code Status: Full Family Communication: Daughter (internal medicine physician) discussed at bedside again 2/22 Disposition Plan: home 2/23   Murray Hodgkins, MD  [facility] Direct contact: see [email or web address]  7PM-7AM contact night coverage as above 12/05/2018, 12:49 PM  LOS: 0 days   Consultants  .   Procedures   . Echo IMPRESSIONS    1. The left ventricle has hyperdynamic systolic function, with an ejection fraction of >65%. The cavity size was normal. There is moderately increased left ventricular wall thickness. Left ventricular diastolic Doppler parameters are consistent with  impaired relaxation.  2. The right ventricle has mildly reduced systolic function. The cavity was moderately enlarged. There is no increase in right ventricular wall thickness. The function of the right ventricular Mid RV free wall is hypokinetic. Right ventricular systolic  pressure is moderately elevated with an estimated pressure of 44.6 mmHg.  3. The tricuspid valve is normal in structure. Mild tricuspid valve regurgitation.  4. The pulmonic valve was normal in structure.  5. The mitral valve is normal in structure.  6. The aortic valve is normal in structure.  7. The inferior vena cava was normal in size with <50% respiratory variability.  Antibiotics  .   Interval History/Subjective  Gets winded when walking but able to settle down after sitting down.  Was able to sleep last night without difficulty.  No chest pain.  No right leg pain.  Objective   Vitals:  Vitals:   12/04/18 2153 12/05/18 0622  BP: (!) 111/58 133/65  Pulse: (!) 59 60  Resp: 16 16  Temp: 98.7 F (37.1 C) 98.1 F (36.7 C)  SpO2: 94% 94%    Exam:  Constitutional:   . Appears calm and comfortable Eyes:  . pupils and irises appear normal ENMT:  . grossly normal hearing  Respiratory:  . CTA bilaterally, no w/r/r.  . Respiratory effort normal.  Cardiovascular:  . RRR, no m/r/g . Telemetry sinus rhythm . No significant LE extremity edema   Musculoskeletal:  . RLE . Nontender, no significant swelling Skin:  . No rash or skin changes right lower extremity Psychiatric:  .  Mental status o Mood, affect appropriate . judgment and insight appear intact     I have personally reviewed the following:   Today's Data  . No new  data  Lab Data  . Other data noted  Micro Data  .   Imaging  . Chest x-ray noted . CTA chest noted bilateral PE with RV strain, other findings . Vascular ultrasound noted  Cardiology Data  . EKG sinus rhythm, no acute changes  Other Data  .   Scheduled Meds: . amLODipine  5 mg Oral q morning - 10a  . atorvastatin  20 mg Oral QPM  . brimonidine  1 drop Both Eyes Q12H   And  . timolol  1 drop Both Eyes Q12H  . cholecalciferol  1,000 Units Oral Daily  . losartan  100 mg Oral Daily   And  . hydrochlorothiazide  25 mg Oral Daily  . multivitamin  1 tablet Oral Daily  . rivaroxaban  15 mg Oral BID WC   Followed by  . [START ON 12/25/2018] rivaroxaban  20 mg Oral Q supper   Continuous Infusions:  Principal Problem:   Acute pulmonary embolism with acute cor pulmonale (HCC) Active Problems:   Left kidney mass   Acute deep vein thrombosis (DVT) of right popliteal vein (HCC)   Aortic atherosclerosis (HCC)   CAD (coronary artery disease)   LOS: 0 days

## 2018-12-06 DIAGNOSIS — I2699 Other pulmonary embolism without acute cor pulmonale: Secondary | ICD-10-CM | POA: Diagnosis not present

## 2018-12-06 DIAGNOSIS — I2609 Other pulmonary embolism with acute cor pulmonale: Secondary | ICD-10-CM | POA: Diagnosis not present

## 2018-12-06 DIAGNOSIS — N2889 Other specified disorders of kidney and ureter: Secondary | ICD-10-CM | POA: Diagnosis not present

## 2018-12-06 DIAGNOSIS — I82431 Acute embolism and thrombosis of right popliteal vein: Secondary | ICD-10-CM | POA: Diagnosis not present

## 2018-12-06 MED ORDER — RIVAROXABAN (XARELTO) VTE STARTER PACK (15 & 20 MG): ORAL_TABLET | ORAL | 0 refills | Status: DC

## 2018-12-06 NOTE — Plan of Care (Signed)
Discharge instructions reviewed with patient and family, questions answered, verbalized understanding.  Patient given script for Xarelto starter kit with instructions she can pick this up at [facility] [address] (see case management note).  Future refills will be per primary provider.

## 2018-12-06 NOTE — Discharge Summary (Signed)
Physician Discharge Summary  Kaitlin Arnold HQP:[id number] DOB: 1936-01-23 DOA: 12/03/2018  PCP: Merrilee Seashore, MD  Admit date: 12/03/2018 Discharge date: 12/06/2018  Recommendations for Outpatient Follow-up:   Acute bilateral PE with CT evidence of right heart strain, confirmed with mild RV dysfunction on echocardiogram --No hypoxia, hemodynamics remained stable.  Continue Xarelto.  Discussed with pulmonology, recommended anticoagulation, no invasive intervention, follow-up with PCP in 1 week for repeat echocardiogram to reassess RV function.   Right lower extremity DVT right popliteal vein, right gastrocnemius vein  Indeterminate left kidney mass --Suggest outpatient MRI to further characterize.  Discussed in detail with patient and daughter who is an internal medicine physician at bedside.  At this point decision has been made to defer to the outpatient setting, I will coordinate with the patient's PCP on Monday.  Right adrenal lesion --Incidental finding, possible mild low lipoma or adenoma.    Coronary artery and aortic calcific atherosclerosis.   Follow-up Information    Merrilee Seashore, MD. Schedule an appointment as soon as possible for a visit in 1 week(s).   Specialty:  Internal Medicine Contact information: [place] [address] [address] [phone]            Discharge Diagnoses: Principal diagnosis is #1 1. Acute bilateral PE with CT evidence of right heart strain 2. Mild RV dysfunction on echocardiogram 3. Demand ischemia 4. Right lower extremity DVT right popliteal vein, right gastrocnemius vein 5. Indeterminate left kidney mass 6. Right adrenal lesion 7. Coronary artery and aortic calcific atherosclerosis.  Discharge Condition: improved Disposition: home  Diet recommendation: Regular  Filed Weights   12/03/18 1531 12/03/18 2142  Weight: 99.8 kg 96.8 kg    History of present illness:  83 year old woman  relatively healthy presented with shortness of breath. Found to have acute bilateral PE with RV strain, right lower extremity DVT.  Hospital Course:  Patient was treated with anticoagulation with gradual clinical improvement.  Hospitalization was uncomplicated and she was discharged on Xarelto.  Individual issues as below.  Acute bilateral PE with CT evidence of right heart strain, confirmed with mild RV dysfunction on echocardiogram, supported by mild troponin elevation, demand ischemia. --No hypoxia, hemodynamics remained stable.  Continue Xarelto.  Discussed with pulmonology, recommended anticoagulation, no invasive intervention, follow-up with PCP in 1 week for repeat echocardiogram to reassess RV function.   Right lower extremity DVT right popliteal vein, right gastrocnemius vein --Remained asymptomatic, continue anticoagulation.  Indeterminate left kidney mass --Discussed in detail with patient and daughter who is an internal medicine physician at bedside.  At this point decision has been made to defer to the outpatient setting, I will coordinate with the patient's PCP on Monday.  Right adrenal lesion --Incidental finding, possible mild low lipoma or adenoma.  Follow-up as an outpatient.  Coronary artery and aortic calcific atherosclerosis. --Remains asymptomatic.  Follow-up as an outpatient.  Consultants   None  Procedures   Echo IMPRESSIONS   1. The left ventricle has hyperdynamic systolic function, with an ejection fraction of >65%. The cavity size was normal. There is moderately increased left ventricular wall thickness. Left ventricular diastolic Doppler parameters are consistent with  impaired relaxation. 2. The right ventricle has mildly reduced systolic function. The cavity was moderately enlarged. There is no increase in right ventricular wall thickness. The function of the right ventricular Mid RV free wall is hypokinetic. Right ventricular systolic    pressure is moderately elevated with an estimated pressure of 44.6 mmHg. 3. The tricuspid  valve is normal in structure. Mild tricuspid valve regurgitation. 4. The pulmonic valve was normal in structure. 5. The mitral valve is normal in structure. 6. The aortic valve is normal in structure. 7. The inferior vena cava was normal in size with <50% respiratory variability.  Today's assessment: S: Feels better, breathing better.  No chest pain.  No leg pain.  Ambulating without significant difficulty. O: Vitals:  Vitals:   12/05/18 2039 12/06/18 0947  BP: (!) 143/57 122/64  Pulse: 65 66  Resp: 18   Temp: 98.1 F (36.7 C)   SpO2: 96% 98%    Constitutional:  . Appears calm and comfortable Respiratory:  . CTA bilaterally, no w/r/r.  . Respiratory effort normal.  Cardiovascular:  . RRR, no m/r/g . Right dorsalis pedis pulse 2+ . No significant LE extremity edema   Psychiatric:  . judgement and insight appear normal . Mental status o Mood, affect appropriate  No new data  Discharge Instructions  Discharge Instructions    Diet general   Complete by:  As directed    Discharge instructions   Complete by:  As directed    Call your physician or seek immediate medical attention for chest pain, shortness of breath, swelling of legs or worsening of condition.   Increase activity slowly   Complete by:  As directed      Allergies as of 12/06/2018   No Known Allergies     Medication List    STOP taking these medications   diphenhydrAMINE 25 mg capsule Commonly known as:  BENADRYL   ferrous sulfate 325 (65 FE) MG tablet     TAKE these medications   amLODipine 5 MG tablet Commonly known as:  NORVASC Take 5 mg by mouth every morning.   atorvastatin 20 MG tablet Commonly known as:  LIPITOR Take 20 mg by mouth every evening.   cholecalciferol 25 MCG (1000 UT) tablet Commonly known as:  VITAMIN D3 Take 1,000 Units by mouth daily.   COMBIGAN 0.2-0.5 % ophthalmic  solution Generic drug:  brimonidine-timolol Place 1 drop into both eyes every 12 (twelve) hours.   losartan-hydrochlorothiazide 100-25 MG tablet Commonly known as:  HYZAAR Take 1 tablet by mouth daily.   multivitamin-lutein Caps capsule Take 1 capsule by mouth daily.   Rivaroxaban 15 & 20 MG Tbpk Take as directed on package: Start with one 15mg  tablet by mouth twice a day with food. On March 13th, switch to one 20mg  tablet once a day with food in the evening.   triamcinolone cream 0.1 % Commonly known as:  KENALOG Apply 1 application topically 2 (two) times daily as needed (dermatitis).      No Known Allergies  The results of significant diagnostics from this hospitalization (including imaging, microbiology, ancillary and laboratory) are listed below for reference.    Significant Diagnostic Studies: Dg Chest 2 View  Result Date: 12/03/2018 CLINICAL DATA:  Shortness of breath. EXAM: CHEST - 2 VIEW COMPARISON:  06/02/2012 FINDINGS: Enlarged cardiac silhouette. Mediastinal contours appear intact. There is no evidence of focal airspace consolidation, pleural effusion or pneumothorax. Pulmonary vascular congestion. Osseous structures are without acute abnormality. Soft tissues are grossly normal. IMPRESSION: Enlarged cardiac silhouette with pulmonary vascular congestion. Electronically Signed   By: Fidela Salisbury M.D.   On: 12/03/2018 17:10   Ct Angio Chest Pe W And/or Wo Contrast  Result Date: 12/03/2018 CLINICAL DATA:  83 y/o  F; 3 weeks of shortness of breath. EXAM: CT ANGIOGRAPHY CHEST WITH CONTRAST TECHNIQUE: Multidetector CT  imaging of the chest was performed using the standard protocol during bolus administration of intravenous contrast. Multiplanar CT image reconstructions and MIPs were obtained to evaluate the vascular anatomy. CONTRAST:  132mL ISOVUE-370 IOPAMIDOL (ISOVUE-370) INJECTION 76% COMPARISON:  12/03/2018 chest radiograph FINDINGS: Cardiovascular: Bilateral acute  lobar, segmental, and small vessel pulmonary emboli. RV/LV = 1.3. Mild cardiomegaly. No pericardial effusion. Coronary artery and aortic calcific atherosclerosis. Mediastinum/Nodes: No enlarged mediastinal, hilar, or axillary lymph nodes. Thyroid gland, trachea, and esophagus demonstrate no significant findings. Lungs/Pleura: Lungs are clear. No pleural effusion or pneumothorax. Upper Abdomen: 17 mm right adrenal nodule measuring -45 HU, possibly a myelolipoma or adenoma with volume averaging. Left kidney nodule measuring 21 mm and 83 HU compatible with hemorrhagic cyst. Left kidney upper pole 73 mm simple cyst. Left kidney upper pole indeterminate 24 mm mass measuring 59 HU. Musculoskeletal: No acute finding. Review of the MIP images confirms the above findings. IMPRESSION: 1. Positive for acute PE with CTevidence of right heart strain (RV/LV Ratio = 1.3) consistent with at least submassive (intermediate risk) PE. The presence of right heart strain has been associated with an increased risk of morbidity and mortality. 2. Coronary artery and aortic calcific atherosclerosis. Aortic Atherosclerosis (ICD10-I70.0). 3. Indeterminate left kidney upper pole 24 mm mass. Further characterization with renal protocol CT or MRI with and without contrast is recommended on a nonemergent basis. 4. 17 mm right adrenal nodule measuring -45 HU, possibly a myelolipoma or adenoma with volume averaging. These results were called by telephone at the time of interpretation on 12/03/2018 at 7:29 pm to Dr. Virgel Manifold , who verbally acknowledged these results. Electronically Signed   By: Kristine Garbe M.D.   On: 12/03/2018 19:31   Vas [place] Lower Extremity Venous (dvt)  Result Date: 12/04/2018  Lower Venous Study Indications: Swelling.  Performing Technologist: Oliver Hum RVT  Examination Guidelines: A complete evaluation includes B-mode imaging, spectral Doppler, color Doppler, and power Doppler as needed of all  accessible portions of each vessel. Bilateral testing is considered an integral part of a complete examination. Limited examinations for reoccurring indications may be performed as noted.  Right Venous Findings: +---------+---------------+---------+-----------+----------+-------+          CompressibilityPhasicitySpontaneityPropertiesSummary +---------+---------------+---------+-----------+----------+-------+ CFV      Full           Yes      Yes                          +---------+---------------+---------+-----------+----------+-------+ SFJ      Full                                                 +---------+---------------+---------+-----------+----------+-------+ FV Prox  Full                                                 +---------+---------------+---------+-----------+----------+-------+ FV Mid   Full                                                 +---------+---------------+---------+-----------+----------+-------+ FV DistalFull                                                 +---------+---------------+---------+-----------+----------+-------+  PFV      Full                                                 +---------+---------------+---------+-----------+----------+-------+ POP      Partial        Yes      Yes                  Acute   +---------+---------------+---------+-----------+----------+-------+ PTV      Full                                                 +---------+---------------+---------+-----------+----------+-------+ PERO     Full                                                 +---------+---------------+---------+-----------+----------+-------+ Gastroc  Partial                                      Acute   +---------+---------------+---------+-----------+----------+-------+  Left Venous Findings: +---+---------------+---------+-----------+----------+-------+    CompressibilityPhasicitySpontaneityPropertiesSummary  +---+---------------+---------+-----------+----------+-------+ CFVFull           Yes      Yes                          +---+---------------+---------+-----------+----------+-------+    Summary: Right: Findings consistent with acute deep vein thrombosis involving the right popliteal vein, and right gastrocnemius vein. No cystic structure found in the popliteal fossa. Left: No evidence of common femoral vein obstruction.  *See table(s) above for measurements and observations. Electronically signed by Deitra Mayo MD on 12/04/2018 at 2:39:26 PM.    Final    Labs: Basic Metabolic Panel: Recent Labs  Lab 12/03/18 1631 12/04/18 0215  NA 139 139  K 3.6 3.3*  CL 105 107  CO2 26 24  GLUCOSE 109* 104*  BUN 16 16  CREATININE 0.80 0.78  CALCIUM 9.4 9.3   Liver Function Tests: Recent Labs  Lab 12/03/18 1631  AST 14*  ALT 17  ALKPHOS 98  BILITOT 0.6  PROT 7.2  ALBUMIN 4.2   CBC: Recent Labs  Lab 12/03/18 1631 12/04/18 0215  WBC 7.3 9.0  NEUTROABS 3.7  --   HGB 13.0 12.5  HCT 40.4 39.2  MCV 87.3 87.1  PLT 162 150   Cardiac Enzymes: Recent Labs  Lab 12/03/18 1631 12/03/18 2214 12/04/18 0215 12/04/18 0838  TROPONINI 0.24* 0.18* 0.13* 0.09*    Recent Labs    12/03/18 1631  BNP 76.5    Principal Problem:   Acute pulmonary embolism with acute cor pulmonale (HCC) Active Problems:   Left kidney mass   Acute deep vein thrombosis (DVT) of right popliteal vein (HCC)   Aortic atherosclerosis (HCC)   CAD (coronary artery disease)   Time coordinating discharge: 25 minutes  Signed:  Murray Hodgkins, MD  [facility]  12/06/2018, 1:57 PM

## 2018-12-06 NOTE — Care Management Note (Signed)
Case Management Note  Patient Details  Name: Kaitlin Arnold MRN: [id number] Date of Birth: 1935/11/04  Subjective/Objective:   Admitted with bilateral PE                 Action/Plan: Spoke to pt and made her aware her pharmacy does not have Xarelto Starter kit. [facility] [address], they have starter kit available.   Expected Discharge Date:  12/06/18               Expected Discharge Plan:  Home/Self Care  In-House Referral:  NA  Discharge planning Services  CM Consult, Medication Assistance  Post Acute Care Choice:  NA Choice offered to:  NA  DME Arranged:  N/A DME Agency:  NA  HH Arranged:  NA HH Agency:  NA  Status of Service:  Completed, signed off  If discussed at [place] of Stay Meetings, dates discussed:    Additional Comments:  Erenest Rasher, RN 12/06/2018, 10:45 AM

## 2018-12-07 ENCOUNTER — Other Ambulatory Visit: Payer: Self-pay | Admitting: Internal Medicine

## 2018-12-07 DIAGNOSIS — I82461 Acute embolism and thrombosis of right calf muscular vein: Secondary | ICD-10-CM | POA: Diagnosis not present

## 2018-12-07 DIAGNOSIS — N2889 Other specified disorders of kidney and ureter: Secondary | ICD-10-CM

## 2018-12-07 DIAGNOSIS — I2699 Other pulmonary embolism without acute cor pulmonale: Secondary | ICD-10-CM | POA: Diagnosis not present

## 2018-12-16 ENCOUNTER — Ambulatory Visit
Admission: RE | Admit: 2018-12-16 | Discharge: 2018-12-16 | Disposition: A | Discharge status: Home / Self Care | Payer: Medicare Other | Source: Ambulatory Visit | Attending: Internal Medicine | Admitting: Internal Medicine

## 2018-12-16 DIAGNOSIS — N289 Disorder of kidney and ureter, unspecified: Secondary | ICD-10-CM | POA: Diagnosis not present

## 2018-12-16 DIAGNOSIS — N281 Cyst of kidney, acquired: Secondary | ICD-10-CM | POA: Diagnosis not present

## 2018-12-16 DIAGNOSIS — N2889 Other specified disorders of kidney and ureter: Secondary | ICD-10-CM

## 2018-12-16 MED ORDER — GADOBENATE DIMEGLUMINE 529 MG/ML IV SOLN
20.0000 mL | Freq: Once | INTRAVENOUS | Status: AC | PRN
  Administered 2018-12-16: 20 mL via INTRAVENOUS

## 2018-12-17 DIAGNOSIS — N2889 Other specified disorders of kidney and ureter: Secondary | ICD-10-CM | POA: Diagnosis not present

## 2018-12-17 NOTE — Progress Notes (Signed)
Subjective:   @Patient  ID@: Kaitlin Arnold, female    DOB: 06-30-1936, 83 y.o.   MRN: [id number]  Chief Complaint  Patient presents with  . Shortness of Breath     Patient referred by Merrilee Seashore for shortness of breath.  HPI: Patient with hypertension, hyperlipidemia, type 2 diabetes, tobacco use for 30 years that recently quit referred to [place] for evaluation of shortness of breath. Patient developed shortness of breath in February and had referral in place to [place]; however, as her symptoms worsened she went to the ER for further evaluation on 12/03/2018.  She was found to have right popliteal DVT and bilateral PE. No history of recent travel or history of familial hypercoagulable state. Found to have RV strain with RV systolic pressure of 44 mmHg. Normal LVEF. No wall motion abnormality. CT of the chest revealed coronary atherosclerosis along with aortic atherosclerosis. She was discharged on Xarelto, which she is tolerating well.  Since discharge, she is overall feeling well. Shortness of breath has improved. She denies any chest pain, PND, or orthopnea. She has chronic leg edema from venous insufficiency that is stable.   She has history of hypertension and hyperlipidemia that is well controlled. Patient reports that she smoked 3 packs per week for the last 50 years, but has recently quit after this episode in Feb.   She was incidentally found to have left kidney mass and underwent MRI of abdomen on 12/16/2018 revealing bilateral renal lesions suspicious renal neoplasms and she is to have this further evaluated with Urology this friday.     Past Medical History:  Diagnosis Date  . Aortic atherosclerosis ([place]) 12/04/2018  . Arthritis    oa and pain left knee;  previous right total knee replacement  1998  . CAD (coronary artery disease) 12/04/2018  . Chest pain 12/18/2018  . GERD (gastroesophageal reflux disease)    seldom  . H/O cardiovascular stress test 05/27/12   STRESS  TEST WAS DONE for cardiac clearance for left total knee replacement--given clearance by dr. Einar Gip.   EF 71%, NO ISCHEMIA  . Hyperlipidemia   . Hypertension   . Shortness of breath    with exertion  . SOB (shortness of breath) 12/18/2018    Past Surgical History:  Procedure Laterality Date  . EYE SURGERY    . HAND SURGERY Bilateral   . HERNIA REPAIR     38 yrs ago  . JOINT REPLACEMENT  1998   right total knee replacement  . TOTAL KNEE ARTHROPLASTY  06/09/2012   Procedure: TOTAL KNEE ARTHROPLASTY;  Surgeon: Mauri Pole, MD;  Location: [facility];  Service: Orthopedics;  Laterality: Left;    Family History  Problem Relation Age of Onset  . Diabetes Father   . Heart attack Sister   . Colon cancer Sister   . Colon cancer Sister     Social History   Socioeconomic History  . Marital status: Widowed    Spouse name: Not on file  . Number of children: 4  . Years of education: Not on file  . Highest education level: Not on file  Occupational History  . Not on file  Social Needs  . Financial resource strain: Not on file  . Food insecurity:    Worry: Not on file    Inability: Not on file  . Transportation needs:    Medical: Not on file    Non-medical: Not on file  Tobacco Use  . Smoking status: Former  Smoker    Packs/day: 0.50    Years: 40.00    Pack years: 20.00    Types: Cigarettes    Last attempt to quit: 12/04/2018    Years since quitting: 0.0  . Smokeless tobacco: Never Used  . Tobacco comment: not everyday smoker--if smoking 4 to 5 cigs a day  Substance and Sexual Activity  . Alcohol use: No  . Drug use: No  . Sexual activity: Not on file  Lifestyle  . Physical activity:    Days per week: Not on file    Minutes per session: Not on file  . Stress: Not on file  Relationships  . Social connections:    Talks on phone: Not on file    Gets together: Not on file    Attends religious service: Not on file    Active member of club or organization: Not on file     Attends meetings of clubs or organizations: Not on file    Relationship status: Not on file  . Intimate partner violence:    Fear of current or ex partner: Not on file    Emotionally abused: Not on file    Physically abused: Not on file    Forced sexual activity: Not on file  Other Topics Concern  . Not on file  Social History Narrative  . Not on file    Current Outpatient Medications on File Prior to Visit  Medication Sig Dispense Refill  . acetaminophen (TYLENOL) 325 MG tablet Take 650 mg by mouth every 6 (six) hours as needed.    Marland Kitchen amLODipine (NORVASC) 5 MG tablet Take 5 mg by mouth every morning.    Marland Kitchen atorvastatin (LIPITOR) 20 MG tablet Take 20 mg by mouth every evening.    . cholecalciferol (VITAMIN D3) 25 MCG (1000 UT) tablet Take 1,000 Units by mouth daily.    . COMBIGAN 0.2-0.5 % ophthalmic solution Place 1 drop into both eyes every 12 (twelve) hours.     Marland Kitchen losartan-hydrochlorothiazide (HYZAAR) 100-25 MG tablet Take 1 tablet by mouth daily.    Vladimir Faster Glycol-Propyl Glycol (SYSTANE) 0.4-0.3 % SOLN Apply to eye daily as needed.    . Rivaroxaban 15 & 20 MG TBPK Take as directed on package: Start with one 15mg  tablet by mouth twice a day with food. On March 13th, switch to one 20mg  tablet once a day with food in the evening. 51 each 0  . triamcinolone cream (KENALOG) 0.1 % Apply 1 application topically 2 (two) times daily as needed (dermatitis).     Marland Kitchen ibuprofen (ADVIL,MOTRIN) 200 MG tablet Take 200 mg by mouth every 6 (six) hours as needed.    . multivitamin-lutein (OCUVITE-LUTEIN) CAPS capsule Take 1 capsule by mouth daily.     No current facility-administered medications on file prior to visit.      Review of Systems  Constitution: Negative for decreased appetite, malaise/fatigue, weight gain and weight loss.  Eyes: Negative for visual disturbance.  Cardiovascular: Positive for dyspnea on exertion and leg swelling (chronic). Negative for chest pain, claudication,  orthopnea, palpitations and syncope.  Respiratory: Negative for hemoptysis and wheezing.   Endocrine: Negative for cold intolerance and heat intolerance.  Hematologic/Lymphatic: Negative for bleeding problem. Does not bruise/bleed easily.  Skin: Negative for nail changes.  Musculoskeletal: Positive for arthritis. Negative for joint pain (OA), muscle weakness and myalgias.  Gastrointestinal: Negative for abdominal pain, change in bowel habit, nausea and vomiting.  Neurological: Negative for difficulty with concentration, dizziness, focal weakness and  headaches.  Psychiatric/Behavioral: Negative for altered mental status and suicidal ideas.  All other systems reviewed and are negative.      Objective:   Physical Exam  Constitutional: She is oriented to person, place, and time. Vital signs are normal. She appears well-developed and well-nourished.  HENT:  Head: Normocephalic and atraumatic.  Neck: Normal range of motion.  Cardiovascular: Normal rate, regular rhythm, normal heart sounds and intact distal pulses.  Pulses:      Femoral pulses are 2+ on the right side and 2+ on the left side.      Popliteal pulses are 1+ on the right side and 1+ on the left side.       Dorsalis pedis pulses are 2+ on the right side and 2+ on the left side.       Posterior tibial pulses are 1+ on the right side.  Varicose veins on left foot  Pulmonary/Chest: Effort normal and breath sounds normal. No accessory muscle usage. No respiratory distress.  Abdominal: Soft. Bowel sounds are normal.  Musculoskeletal: Normal range of motion.  Neurological: She is alert and oriented to person, place, and time.  Skin: Skin is warm and dry.  Vitals reviewed.   Cardiac studies:   Echocardiogram 12/03/2018: Left Ventricle: The left ventricle has hyperdynamic systolic function, with an ejection fraction of >65%. The cavity size was normal. There is moderately increased left ventricular wall thickness. Left ventricular  diastolic Doppler parameters are  consistent with impaired relaxation Right Ventricle: The right ventricle has mildly reduced systolic function. The cavity was moderately enlarged. There is no increase in right ventricular wall thickness. The function of the right ventricular Mid RV free wall is hypokinetic. Right  ventricular systolic pressure is moderately elevated with an estimated pressure of 44.6 mmHg. Left Atrium: left atrial size was normal in size Right Atrium: right atrial size was normal in size. Right atrial pressure is estimated at 8 mmHg. Interatrial Septum: The interatrial septum was not well visualized. Pericardium: There is no evidence of pericardial effusion. Mitral Valve: The mitral valve is normal in structure. Mitral valve regurgitation is not visualized by color flow Doppler. Tricuspid Valve: The tricuspid valve is normal in structure. Tricuspid valve regurgitation is mild by color flow Doppler. Aortic Valve: The aortic valve is normal in structure. Aortic valve regurgitation was not visualized by color flow Doppler. Pulmonic Valve: The pulmonic valve was normal in structure. Pulmonic valve regurgitation is trivial by color flow Doppler. Venous: The inferior vena cava is normal in size with less than 50% respiratory variability.  EKG 12/03/2018: Abnormal R-wave progression, late transition. Borderline T wave abnormalities.  Vascular US Lower Extremity 12/03/2018: Right: Findings consistent with acute deep vein thrombosis involving the right popliteal vein, and right gastrocnemius vein. No cystic structure found in the popliteal fossa. Left: No evidence of common femoral vein obstruction.  CMP Latest Ref Rng & Units 12/04/2018 12/03/2018 06/11/2012  Glucose 70 - 99 mg/dL 104(H) 109(H) 144(H)  BUN 8 - 23 mg/dL 16 16 21   Creatinine 0.44 - 1.00 mg/dL 0.78 0.80 0.68  Sodium 135 - 145 mmol/L 139 139 133(L)  Potassium 3.5 - 5.1 mmol/L 3.3(L) 3.6 3.9  Chloride 98 - 111 mmol/L 107  105 103  CO2 22 - 32 mmol/L 24 26 24   Calcium 8.9 - 10.3 mg/dL 9.3 9.4 8.5  Total Protein 6.5 - 8.1 g/dL - 7.2 -  Total Bilirubin 0.3 - 1.2 mg/dL - 0.6 -  Alkaline Phos 38 - 126 U/L - 98 -  AST 15 - 41 U/L - 14(L) -  ALT 0 - 44 U/L - 17 -   CBC Latest Ref Rng & Units 12/04/2018 12/03/2018 06/11/2012  WBC 4.0 - 10.5 K/uL 9.0 7.3 11.8(H)  Hemoglobin 12.0 - 15.0 g/dL 12.5 13.0 11.2(L)  Hematocrit 36.0 - 46.0 % 39.2 40.4 32.7(L)  Platelets 150 - 400 K/uL 150 162 274          Assessment & Recommendations:   1. SOB (shortness of breath) Likely multifactoral. Symptoms have improved since being discharged from the hospital.  2. Bilateral pulmonary embolism ([place]) Noted to have RV strain on echocardiogram. No evidence of heart failure. Will need repeat echocardiogram in a few months. Currently on Xarelto, tolerating well without bleeding diathesis.  3. Acute deep vein thrombosis (DVT) of right popliteal vein ([facility]) Unsure of etiology for DVT. Continue with Xarelto.   4. Coronary artery calcification seen on CT scan Noted on recent CT scan. Has had negative nuclear stress test in 2013. No symptoms of chest pain. I have recommended obtaining repeat lexiscan nuclear stress test as it has been since 2013 and to also see if this is contributing to her dyspnea; however, her daughter, who is a physician, wishes to hold off until later as she is also being evaluated for newly noted renal mass.   5. Essential hypertension Well controlled on current medical therapy, will continue the same.   6. Former tobacco use Recently quit since hospitalization. Encouraged her to continue with smoking cessation. Offered medications if needed.  7. Hyperlipidemia associated with type 2 diabetes mellitus ([place]) Currently on Lipitor. Being managed by PCP.   We will see her back in 8 weeks for follow up on shortness of breath.  Thank you for referring this patient. Please do not hesitate to contact me for any  questions.   Jeri Lager, MSN, APRN, FNP-C [facility], [place] Office: [phone] Fax: [phone]

## 2018-12-18 ENCOUNTER — Encounter: Payer: Self-pay | Admitting: Cardiology

## 2018-12-18 ENCOUNTER — Ambulatory Visit (INDEPENDENT_AMBULATORY_CARE_PROVIDER_SITE_OTHER): Payer: Medicare Other | Admitting: Cardiology

## 2018-12-18 VITALS — BP 127/71 | HR 56 | Ht 66.0 in | Wt 218.8 lb

## 2018-12-18 DIAGNOSIS — I2699 Other pulmonary embolism without acute cor pulmonale: Secondary | ICD-10-CM | POA: Diagnosis not present

## 2018-12-18 DIAGNOSIS — Z87891 Personal history of nicotine dependence: Secondary | ICD-10-CM

## 2018-12-18 DIAGNOSIS — E785 Hyperlipidemia, unspecified: Secondary | ICD-10-CM

## 2018-12-18 DIAGNOSIS — I2510 Atherosclerotic heart disease of native coronary artery without angina pectoris: Secondary | ICD-10-CM | POA: Diagnosis not present

## 2018-12-18 DIAGNOSIS — E1169 Type 2 diabetes mellitus with other specified complication: Secondary | ICD-10-CM

## 2018-12-18 DIAGNOSIS — R079 Chest pain, unspecified: Secondary | ICD-10-CM | POA: Insufficient documentation

## 2018-12-18 DIAGNOSIS — I10 Essential (primary) hypertension: Secondary | ICD-10-CM

## 2018-12-18 DIAGNOSIS — R0602 Shortness of breath: Secondary | ICD-10-CM

## 2018-12-18 DIAGNOSIS — I82431 Acute embolism and thrombosis of right popliteal vein: Secondary | ICD-10-CM | POA: Diagnosis not present

## 2018-12-18 HISTORY — DX: Chest pain, unspecified: R07.9

## 2018-12-18 HISTORY — DX: Shortness of breath: R06.02

## 2018-12-20 ENCOUNTER — Encounter: Payer: Self-pay | Admitting: Cardiology

## 2018-12-25 DIAGNOSIS — D3002 Benign neoplasm of left kidney: Secondary | ICD-10-CM | POA: Diagnosis not present

## 2019-02-12 ENCOUNTER — Other Ambulatory Visit: Payer: Self-pay

## 2019-02-12 ENCOUNTER — Encounter: Payer: Self-pay | Admitting: Cardiology

## 2019-02-12 ENCOUNTER — Ambulatory Visit (INDEPENDENT_AMBULATORY_CARE_PROVIDER_SITE_OTHER): Payer: Medicare Other | Admitting: Cardiology

## 2019-02-12 VITALS — BP 164/83 | HR 72 | Ht 66.0 in | Wt 217.0 lb

## 2019-02-12 DIAGNOSIS — I10 Essential (primary) hypertension: Secondary | ICD-10-CM

## 2019-02-12 DIAGNOSIS — E7800 Pure hypercholesterolemia, unspecified: Secondary | ICD-10-CM

## 2019-02-12 DIAGNOSIS — I2510 Atherosclerotic heart disease of native coronary artery without angina pectoris: Secondary | ICD-10-CM | POA: Diagnosis not present

## 2019-02-12 DIAGNOSIS — R0602 Shortness of breath: Secondary | ICD-10-CM | POA: Diagnosis not present

## 2019-02-12 DIAGNOSIS — I82432 Acute embolism and thrombosis of left popliteal vein: Secondary | ICD-10-CM

## 2019-02-12 DIAGNOSIS — Z86711 Personal history of pulmonary embolism: Secondary | ICD-10-CM | POA: Diagnosis not present

## 2019-02-12 MED ORDER — AMLODIPINE BESYLATE 10 MG PO TABS: 10.0000 mg | ORAL_TABLET | Freq: Every morning | ORAL | 1 refills | Status: DC

## 2019-02-12 NOTE — Progress Notes (Signed)
Virtual Visit via Telephone Note: Patient unable to use video assisted device.  This visit type was conducted due to national recommendations for restrictions regarding the COVID-19 Pandemic (e.g. social distancing).  This format is felt to be most appropriate for this patient at this time.  All issues noted in this document were discussed and addressed.  No physical exam was performed.  The patient has consented to conduct a Telehealth visit and understands insurance will be billed.   I connected with@, on 02/12/19 at  by TELEPHONE and verified that I am speaking with the correct person using two identifiers.   I discussed the limitations of evaluation and management by telemedicine and the availability of in person appointments. The patient expressed understanding and agreed to proceed.   I have discussed with patient regarding the safety during COVID Pandemic and steps and precautions to be taken including social distancing, frequent hand wash and use of detergent soap, gels with the patient. I asked the patient to avoid touching mouth, nose, eyes, ears with the hands. I encouraged regular walking around the neighborhood and exercise and regular diet, as long as social distancing can be maintained.  Primary Physician/Referring:  Merrilee Seashore, MD  Patient ID: Kaitlin Arnold, female    DOB: 1936/06/08, 83 y.o.   MRN: [id number]  Chief Complaint  Patient presents with  . Coronary Artery Disease  . PAD  . Follow-up  . Shortness of Breath  . Leg Swelling    HPI: Kaitlin Arnold  is a 83 y.o. female  with  hypertension, hyperlipidemia, type 2 diabetes, tobacco use for 30 years quit in beginning of 2020, hospital admission on 12/03/2018 for right popliteal DVT and bilateral PE with RV strain and coronary calcification. Now on Xarelto.  Still has dyspnea  gradually improving but still worried about persistent leg edema and also persistent dyspnea. She denies any chest pain, PND, or  orthopnea. Past medical history significant for hypertension, hyperlipidemia and diet controlled diabetes mellitus.  She was incidentally found to have left kidney mass and underwent MRI of abdomen on 12/16/2018 revealing bilateral renal lesions suspicious renal neoplasms and Urology Recommended rechecking MRI in September 2020, thought to be benign.  Past Medical History:  Diagnosis Date  . Aortic atherosclerosis ([place]) 12/04/2018  . Arthritis    oa and pain left knee;  previous right total knee replacement  1998  . CAD (coronary artery disease) 12/04/2018  . Chest pain 12/18/2018  . GERD (gastroesophageal reflux disease)    seldom  . H/O cardiovascular stress test 05/27/12   STRESS TEST WAS DONE for cardiac clearance for left total knee replacement--given clearance by dr. Einar Gip.   EF 71%, NO ISCHEMIA  . Hyperlipidemia   . Hypertension   . Shortness of breath    with exertion  . SOB (shortness of breath) 12/18/2018    Past Surgical History:  Procedure Laterality Date  . EYE SURGERY    . HAND SURGERY Bilateral   . HERNIA REPAIR     38 yrs ago  . JOINT REPLACEMENT  1998   right total knee replacement  . TOTAL KNEE ARTHROPLASTY  06/09/2012   Procedure: TOTAL KNEE ARTHROPLASTY;  Surgeon: Mauri Pole, MD;  Location: [facility];  Service: Orthopedics;  Laterality: Left;    Social History   Socioeconomic History  . Marital status: Widowed    Spouse name: Not on file  . Number of children: 4  . Years of education: Not on file  .  Highest education level: Not on file  Occupational History  . Not on file  Social Needs  . Financial resource strain: Not on file  . Food insecurity:    Worry: Not on file    Inability: Not on file  . Transportation needs:    Medical: Not on file    Non-medical: Not on file  Tobacco Use  . Smoking status: Former Smoker    Packs/day: 0.50    Years: 40.00    Pack years: 20.00    Types: Cigarettes    Last attempt to quit: 12/04/2018    Years since  quitting: 0.1  . Smokeless tobacco: Never Used  . Tobacco comment: not everyday smoker--if smoking 4 to 5 cigs a day  Substance and Sexual Activity  . Alcohol use: Yes    Comment: very seldom  . Drug use: No  . Sexual activity: Not on file  Lifestyle  . Physical activity:    Days per week: Not on file    Minutes per session: Not on file  . Stress: Not on file  Relationships  . Social connections:    Talks on phone: Not on file    Gets together: Not on file    Attends religious service: Not on file    Active member of club or organization: Not on file    Attends meetings of clubs or organizations: Not on file    Relationship status: Not on file  . Intimate partner violence:    Fear of current or ex partner: Not on file    Emotionally abused: Not on file    Physically abused: Not on file    Forced sexual activity: Not on file  Other Topics Concern  . Not on file  Social History Narrative  . Not on file    Current Outpatient Medications on File Prior to Visit  Medication Sig Dispense Refill  . acetaminophen (TYLENOL) 325 MG tablet Take 650 mg by mouth every 6 (six) hours as needed.    Marland Kitchen atorvastatin (LIPITOR) 20 MG tablet Take 20 mg by mouth every evening.    . cholecalciferol (VITAMIN D3) 25 MCG (1000 UT) tablet Take 1,000 Units by mouth daily.    . COMBIGAN 0.2-0.5 % ophthalmic solution Place 1 drop into both eyes every 12 (twelve) hours.     Marland Kitchen losartan-hydrochlorothiazide (HYZAAR) 100-25 MG tablet Take 1 tablet by mouth daily.    . multivitamin-lutein (OCUVITE-LUTEIN) CAPS capsule Take 1 capsule by mouth daily.    Vladimir Faster Glycol-Propyl Glycol (SYSTANE) 0.4-0.3 % SOLN Apply to eye daily as needed.    . rivaroxaban (XARELTO) 20 MG TABS tablet Take 20 mg by mouth daily with supper.    . triamcinolone cream (KENALOG) 0.1 % Apply 1 application topically 2 (two) times daily as needed (dermatitis).     Marland Kitchen ibuprofen (ADVIL,MOTRIN) 200 MG tablet Take 200 mg by mouth every 6 (six)  hours as needed.     No current facility-administered medications on file prior to visit.     Review of Systems  Constitution: Negative for decreased appetite, malaise/fatigue, weight gain and weight loss.  Eyes: Positive for photophobia. Negative for visual disturbance.  Cardiovascular: Positive for dyspnea on exertion and leg swelling (chronic). Negative for chest pain, claudication, orthopnea, palpitations and syncope.  Respiratory: Negative for hemoptysis and wheezing.   Endocrine: Negative for cold intolerance and heat intolerance.  Hematologic/Lymphatic: Does not bruise/bleed easily.  Skin: Negative for nail changes.  Musculoskeletal: Positive for arthritis. Negative for  muscle weakness and myalgias.  Gastrointestinal: Negative for abdominal pain, change in bowel habit, nausea and vomiting.  Neurological: Negative for difficulty with concentration, dizziness, focal weakness and headaches.  Psychiatric/Behavioral: Negative for altered mental status and suicidal ideas.  All other systems reviewed and are negative.     Objective  Blood pressure (!) 164/83, pulse 72, height 5\' 6"  (1.676 m), weight 217 lb (98.4 kg). Body mass index is 35.02 kg/m.    Physical exam not performed or limited due to virtual visit. Please see exam details from prior visit is as below.  Radiology:  CTA of chest 12/03/2018: 1. Positive for acute PE with CTevidence of right heart strain (RV/LV Ratio = 1.3) consistent with at least submassive (intermediate risk) PE. The presence of right heart strain has been associated with an increased risk of morbidity and mortality. 2. Coronary artery and aortic calcific atherosclerosis. Aortic Atherosclerosis (ICD10-I70.0). 3. Indeterminate left kidney upper pole 24 mm mass. Further characterization with renal protocol CT or MRI with and without contrast is recommended on a nonemergent basis. 4. 17 mm right adrenal nodule measuring -45 HU, possibly a myelolipoma or  adenoma with volume averaging.  MRI of abdomen 12/16/2018:  1. 2.7 cm cystic lesion projecting off the inferior pole of the right kidney has a solid nodular enhancing component worrisome for a cystic renal neoplasm. 2. 2 cm medial upper pole left renal lesion is solid and enhancing and consistent with a renal cell neoplasm. The adjacent 2.7 x 2.5 cm more lateral lesion is a benign hemorrhagic cyst. 3. Simple left renal cysts are noted and there are also 2 small hemorrhagic cyst associated with the right kidney. 4. No abdominal lymphadenopathy.  The renal veins are patent. 5. Small adrenal gland adenomas. 6. Recommend urology consultation for the bilateral suspected renal neoplasms.  Laboratory examination:    CMP Latest Ref Rng & Units 12/04/2018 12/03/2018 06/11/2012  Glucose 70 - 99 mg/dL 104(H) 109(H) 144(H)  BUN 8 - 23 mg/dL 16 16 21   Creatinine 0.44 - 1.00 mg/dL 0.78 0.80 0.68  Sodium 135 - 145 mmol/L 139 139 133(L)  Potassium 3.5 - 5.1 mmol/L 3.3(L) 3.6 3.9  Chloride 98 - 111 mmol/L 107 105 103  CO2 22 - 32 mmol/L 24 26 24   Calcium 8.9 - 10.3 mg/dL 9.3 9.4 8.5  Total Protein 6.5 - 8.1 g/dL - 7.2 -  Total Bilirubin 0.3 - 1.2 mg/dL - 0.6 -  Alkaline Phos 38 - 126 U/L - 98 -  AST 15 - 41 U/L - 14(L) -  ALT 0 - 44 U/L - 17 -   CBC Latest Ref Rng & Units 12/04/2018 12/03/2018 06/11/2012  WBC 4.0 - 10.5 K/uL 9.0 7.3 11.8(H)  Hemoglobin 12.0 - 15.0 g/dL 12.5 13.0 11.2(L)  Hematocrit 36.0 - 46.0 % 39.2 40.4 32.7(L)  Platelets 150 - 400 K/uL 150 162 274   Lipid Panel  No results found for: CHOL, TRIG, HDL, CHOLHDL, VLDL, LDLCALC, LDLDIRECT HEMOGLOBIN A1C No results found for: HGBA1C, MPG TSH No results for input(s): TSH in the last 8760 hours.  Cardiac Studies:   Echocardiogram 12/03/2018: Left Ventricle: The left ventricle has hyperdynamic systolic function, with an ejection fraction of >65%. The cavity size was normal. There is moderately increased left ventricular wall thickness.  Left ventricular diastolic Doppler parameters are  consistent with impaired relaxation Right Ventricle: The right ventricle has mildly reduced systolic function. The cavity was moderately enlarged. There is no increase in right ventricular wall thickness. The  function of the right ventricular Mid RV free wall is hypokinetic. Right  ventricular systolic pressure is moderately elevated with an estimated pressure of 44.6 mmHg. Left Atrium: left atrial size was normal in size Right Atrium: right atrial size was normal in size. Right atrial pressure is estimated at 8 mmHg. Interatrial Septum: The interatrial septum was not well visualized. Pericardium: There is no evidence of pericardial effusion. Mitral Valve: The mitral valve is normal in structure. Mitral valve regurgitation is not visualized by color flow Doppler. Tricuspid Valve: The tricuspid valve is normal in structure. Tricuspid valve regurgitation is mild by color flow Doppler. Aortic Valve: The aortic valve is normal in structure. Aortic valve regurgitation was not visualized by color flow Doppler. Pulmonic Valve: The pulmonic valve was normal in structure. Pulmonic valve regurgitation is trivial by color flow Doppler. Venous: The inferior vena cava is normal in size with less than 50% respiratory variability.  Vascular US Lower Extremity 12/03/2018: Right: Findings consistent with acute deep vein thrombosis involving the right popliteal vein, and right gastrocnemius vein. No cystic structure found in the popliteal fossa. Left: No evidence of common femoral vein obstruction.  Assessment   SOB (shortness of breath) - Plan: PCV MYOCARDIAL PERFUSION WITH LEXISCAN, PCV ECHOCARDIOGRAM COMPLETE  Coronary artery calcification seen on CT scan - Plan: PCV MYOCARDIAL PERFUSION WITH LEXISCAN  Essential hypertension - Plan: amLODipine (NORVASC) 10 MG tablet  Hypercholesteremia  History of pulmonary embolism 12/03/18 - Plan: PCV ECHOCARDIOGRAM  COMPLETE  Acute deep vein thrombosis (DVT) of popliteal vein of left lower extremity ([place]) and PE 12/03/18  EKG 12/03/18/20: Normal sinus rhythm at rate of 85 bpm, left axis deviation, left can't fascicular block.  Incomplete right bundle branch block.  Nonspecific T abnormality.  Recommendations:   Patient With continued shortness of breath probably related to acute PE that happened 3 months ago, Had RV dilatation and RV strain.  She will need repeat echocardiogram to reevaluate her RV systolic function and pulmonary hypertension.  Due to coronary calcification and persistent dyspnea, hypertension and hyperlipidemia and prior tobacco use disorder, she will need stress test. Schedule for a Lexiscan Sestamibi stress test to evaluate for myocardial ischemia. Patient unable to do treadmill stress testing due to Dyspnea. She is presently on anticoagulation with Xarelto, I did not and aspirin to reduce the risk of bleeding.  Blood pressure is elevated today, I have increased the dose of amlodipine from 5 mg to 10 mg daily. She is on statin for hyperlipidemia and diabetes is diet controlled.  I'd like to see her back after the test.  Adrian Prows, MD, [facility] 02/12/2019, 3:39 PM [facility] [place] Pager: [phone] Office: [phone] If no answer Cell [phone]

## 2019-02-18 DIAGNOSIS — K625 Hemorrhage of anus and rectum: Secondary | ICD-10-CM | POA: Diagnosis not present

## 2019-02-18 DIAGNOSIS — R140 Abdominal distension (gaseous): Secondary | ICD-10-CM | POA: Diagnosis not present

## 2019-02-18 DIAGNOSIS — E669 Obesity, unspecified: Secondary | ICD-10-CM | POA: Diagnosis not present

## 2019-02-18 DIAGNOSIS — K641 Second degree hemorrhoids: Secondary | ICD-10-CM | POA: Diagnosis not present

## 2019-02-18 DIAGNOSIS — K219 Gastro-esophageal reflux disease without esophagitis: Secondary | ICD-10-CM | POA: Diagnosis not present

## 2019-02-18 DIAGNOSIS — K5730 Diverticulosis of large intestine without perforation or abscess without bleeding: Secondary | ICD-10-CM | POA: Diagnosis not present

## 2019-02-22 ENCOUNTER — Other Ambulatory Visit: Payer: Self-pay | Admitting: Urology

## 2019-02-22 DIAGNOSIS — D3000 Benign neoplasm of unspecified kidney: Secondary | ICD-10-CM

## 2019-03-15 ENCOUNTER — Other Ambulatory Visit: Payer: PRIVATE HEALTH INSURANCE

## 2019-03-17 ENCOUNTER — Other Ambulatory Visit: Payer: Self-pay

## 2019-03-17 ENCOUNTER — Ambulatory Visit (INDEPENDENT_AMBULATORY_CARE_PROVIDER_SITE_OTHER): Payer: Medicare Other

## 2019-03-17 DIAGNOSIS — R0602 Shortness of breath: Secondary | ICD-10-CM | POA: Diagnosis not present

## 2019-03-17 DIAGNOSIS — I2510 Atherosclerotic heart disease of native coronary artery without angina pectoris: Secondary | ICD-10-CM | POA: Diagnosis not present

## 2019-03-18 ENCOUNTER — Ambulatory Visit (INDEPENDENT_AMBULATORY_CARE_PROVIDER_SITE_OTHER): Payer: Medicare Other

## 2019-03-18 DIAGNOSIS — R0602 Shortness of breath: Secondary | ICD-10-CM | POA: Diagnosis not present

## 2019-03-18 DIAGNOSIS — Z86711 Personal history of pulmonary embolism: Secondary | ICD-10-CM | POA: Diagnosis not present

## 2019-03-18 NOTE — Progress Notes (Signed)
LMOM with details

## 2019-03-29 ENCOUNTER — Ambulatory Visit (INDEPENDENT_AMBULATORY_CARE_PROVIDER_SITE_OTHER): Payer: Medicare Other | Admitting: Cardiology

## 2019-03-29 ENCOUNTER — Other Ambulatory Visit: Payer: Self-pay

## 2019-03-29 ENCOUNTER — Encounter: Payer: Self-pay | Admitting: Cardiology

## 2019-03-29 VITALS — BP 160/81 | HR 70 | Ht 66.0 in | Wt 220.0 lb

## 2019-03-29 DIAGNOSIS — I2510 Atherosclerotic heart disease of native coronary artery without angina pectoris: Secondary | ICD-10-CM

## 2019-03-29 DIAGNOSIS — I2699 Other pulmonary embolism without acute cor pulmonale: Secondary | ICD-10-CM | POA: Diagnosis not present

## 2019-03-29 DIAGNOSIS — I82432 Acute embolism and thrombosis of left popliteal vein: Secondary | ICD-10-CM | POA: Diagnosis not present

## 2019-03-29 DIAGNOSIS — I10 Essential (primary) hypertension: Secondary | ICD-10-CM

## 2019-03-29 MED ORDER — LABETALOL HCL 200 MG PO TABS: 200.0000 mg | ORAL_TABLET | Freq: Two times a day (BID) | ORAL | 3 refills | Status: DC

## 2019-03-29 NOTE — Progress Notes (Signed)
Virtual Visit via Telephone Note: Patient unable to use video assisted device.  This visit type was conducted due to national recommendations for restrictions regarding the COVID-19 Pandemic (e.g. social distancing).  This format is felt to be most appropriate for this patient at this time.  All issues noted in this document were discussed and addressed.  No physical exam was performed.  The patient has consented to conduct a Telehealth visit and understands insurance will be billed.   I connected with@, on 03/29/19 at  by TELEPHONE and verified that I am speaking with the correct person using two identifiers.   I discussed the limitations of evaluation and management by telemedicine and the availability of in person appointments. The patient expressed understanding and agreed to proceed.   I have discussed with patient regarding the safety during COVID Pandemic and steps and precautions to be taken including social distancing, frequent hand wash and use of detergent soap, gels with the patient. I asked the patient to avoid touching mouth, nose, eyes, ears with the hands. I encouraged regular walking around the neighborhood and exercise and regular diet, as long as social distancing can be maintained.  Primary Physician/Referring:  Merrilee Seashore, MD  Patient ID: Kaitlin Arnold, female    DOB: [birth year], 83 y.o.   MRN: [id number]  Chief Complaint  Patient presents with  . Shortness of Breath  . Results  . Follow-up    6wk    HPI: Kaitlin Arnold  is a 83 y.o. female  with  hypertension, hyperlipidemia, type 2 diabetes, tobacco use for 30 years, hospital admission on 12/03/2018 for right popliteal DVT and bilateral PE with RV strain and coronary calcification. Now on Xarelto. Spontaneous DVT and PE.  She has quit smoking since.   Still has dyspnea  gradually improving and now present in the ankles and foot only. She denies any chest pain, PND, or orthopnea. Past medical history  significant for hypertension, hyperlipidemia and diet controlled diabetes mellitus.  Overall she started to feel better, also states that her energy level has improved.  She was incidentally found to have left kidney mass and underwent MRI of abdomen on 12/16/2018 revealing bilateral renal lesions suspicious renal neoplasms and Urology recommended rechecking MRI in September 2020, thought to be benign.  Past Medical History:  Diagnosis Date  . Aortic atherosclerosis ([place]) 12/04/2018  . Arthritis    oa and pain left knee;  previous right total knee replacement  1998  . CAD (coronary artery disease) 12/04/2018  . Chest pain 12/18/2018  . GERD (gastroesophageal reflux disease)    seldom  . H/O cardiovascular stress test 05/27/12   STRESS TEST WAS DONE for cardiac clearance for left total knee replacement--given clearance by dr. Einar Gip.   EF 71%, NO ISCHEMIA  . Hyperlipidemia   . Hypertension   . Shortness of breath    with exertion  . SOB (shortness of breath) 12/18/2018    Past Surgical History:  Procedure Laterality Date  . EYE SURGERY    . HAND SURGERY Bilateral   . HERNIA REPAIR     40 yrs ago  . JOINT REPLACEMENT  1998   right total knee replacement  . TOTAL KNEE ARTHROPLASTY  06/09/2012   Procedure: TOTAL KNEE ARTHROPLASTY;  Surgeon: Mauri Pole, MD;  Location: [facility];  Service: Orthopedics;  Laterality: Left;    Social History   Socioeconomic History  . Marital status: Widowed    Spouse name: Not on file  .  Number of children: 4  . Years of education: Not on file  . Highest education level: Not on file  Occupational History  . Not on file  Social Needs  . Financial resource strain: Not on file  . Food insecurity    Worry: Not on file    Inability: Not on file  . Transportation needs    Medical: Not on file    Non-medical: Not on file  Tobacco Use  . Smoking status: Former Smoker    Packs/day: 0.50    Years: 40.00    Pack years: 20.00    Types: Cigarettes     Quit date: 12/04/2018    Years since quitting: 0.3  . Smokeless tobacco: Never Used  . Tobacco comment: not everyday smoker--if smoking 4 to 5 cigs a day  Substance and Sexual Activity  . Alcohol use: Yes    Comment: very seldom  . Drug use: No  . Sexual activity: Not on file  Lifestyle  . Physical activity    Days per week: Not on file    Minutes per session: Not on file  . Stress: Not on file  Relationships  . Social Herbalist on phone: Not on file    Gets together: Not on file    Attends religious service: Not on file    Active member of club or organization: Not on file    Attends meetings of clubs or organizations: Not on file    Relationship status: Not on file  . Intimate partner violence    Fear of current or ex partner: Not on file    Emotionally abused: Not on file    Physically abused: Not on file    Forced sexual activity: Not on file  Other Topics Concern  . Not on file  Social History Narrative  . Not on file    Current Outpatient Medications on File Prior to Visit  Medication Sig Dispense Refill  . acetaminophen (TYLENOL) 325 MG tablet Take 650 mg by mouth every 6 (six) hours as needed.    Marland Kitchen amLODipine (NORVASC) 10 MG tablet Take 1 tablet (10 mg total) by mouth every morning. 90 tablet 1  . atorvastatin (LIPITOR) 20 MG tablet Take 20 mg by mouth every evening.    . cholecalciferol (VITAMIN D3) 25 MCG (1000 UT) tablet Take 1,000 Units by mouth daily.    . COMBIGAN 0.2-0.5 % ophthalmic solution Place 1 drop into both eyes every 12 (twelve) hours.     Marland Kitchen losartan-hydrochlorothiazide (HYZAAR) 100-25 MG tablet Take 1 tablet by mouth daily.    . multivitamin-lutein (OCUVITE-LUTEIN) CAPS capsule Take 1 capsule by mouth daily.    Vladimir Faster Glycol-Propyl Glycol (SYSTANE) 0.4-0.3 % SOLN Apply to eye daily as needed.    . rivaroxaban (XARELTO) 20 MG TABS tablet Take 20 mg by mouth daily with supper.    . triamcinolone cream (KENALOG) 0.1 % Apply 1  application topically 2 (two) times daily as needed (dermatitis).      No current facility-administered medications on file prior to visit.     Review of Systems  Constitution: Negative for decreased appetite, malaise/fatigue, weight gain and weight loss.  Eyes: Positive for photophobia. Negative for visual disturbance.  Cardiovascular: Positive for dyspnea on exertion and leg swelling (chronic). Negative for chest pain, claudication, orthopnea, palpitations and syncope.  Respiratory: Negative for hemoptysis and wheezing.   Endocrine: Negative for cold intolerance and heat intolerance.  Hematologic/Lymphatic: Does not bruise/bleed easily.  Skin: Negative for nail changes.  Musculoskeletal: Positive for arthritis. Negative for muscle weakness and myalgias.  Gastrointestinal: Negative for abdominal pain, change in bowel habit, nausea and vomiting.  Neurological: Negative for difficulty with concentration, dizziness, focal weakness and headaches.  Psychiatric/Behavioral: Negative for altered mental status and suicidal ideas.  All other systems reviewed and are negative.     Objective  Blood pressure (!) 160/81, pulse 70, height 5\' 6"  (1.676 m), weight 220 lb (99.8 kg). Body mass index is 35.51 kg/m.    Physical exam not performed or limited due to virtual visit. Please see exam details from prior visit is as below.  Radiology:  CTA of chest 12/03/2018: 1. Positive for acute PE with CTevidence of right heart strain (RV/LV Ratio = 1.3) consistent with at least submassive (intermediate risk) PE. The presence of right heart strain has been associated with an increased risk of morbidity and mortality. 2. Coronary artery and aortic calcific atherosclerosis. Aortic Atherosclerosis (ICD10-I70.0). 3. Indeterminate left kidney upper pole 24 mm mass. Further characterization with renal protocol CT or MRI with and without contrast is recommended on a nonemergent basis. 4. 17 mm right adrenal nodule  measuring -45 HU, possibly a myelolipoma or adenoma with volume averaging.  MRI of abdomen 12/16/2018:  1. 2.7 cm cystic lesion projecting off the inferior pole of the right kidney has a solid nodular enhancing component worrisome for a cystic renal neoplasm. 2. 2 cm medial upper pole left renal lesion is solid and enhancing and consistent with a renal cell neoplasm. The adjacent 2.7 x 2.5 cm more lateral lesion is a benign hemorrhagic cyst. 3. Simple left renal cysts are noted and there are also 2 small hemorrhagic cyst associated with the right kidney. 4. No abdominal lymphadenopathy.  The renal veins are patent. 5. Small adrenal gland adenomas. 6. Recommend urology consultation for the bilateral suspected renal neoplasms.  Laboratory examination:    CMP Latest Ref Rng & Units 12/04/2018 12/03/2018 06/11/2012  Glucose 70 - 99 mg/dL 104(H) 109(H) 144(H)  BUN 8 - 23 mg/dL 16 16 21   Creatinine 0.44 - 1.00 mg/dL 0.78 0.80 0.68  Sodium 135 - 145 mmol/L 139 139 133(L)  Potassium 3.5 - 5.1 mmol/L 3.3(L) 3.6 3.9  Chloride 98 - 111 mmol/L 107 105 103  CO2 22 - 32 mmol/L 24 26 24   Calcium 8.9 - 10.3 mg/dL 9.3 9.4 8.5  Total Protein 6.5 - 8.1 g/dL - 7.2 -  Total Bilirubin 0.3 - 1.2 mg/dL - 0.6 -  Alkaline Phos 38 - 126 U/L - 98 -  AST 15 - 41 U/L - 14(L) -  ALT 0 - 44 U/L - 17 -   CBC Latest Ref Rng & Units 12/04/2018 12/03/2018 06/11/2012  WBC 4.0 - 10.5 K/uL 9.0 7.3 11.8(H)  Hemoglobin 12.0 - 15.0 g/dL 12.5 13.0 11.2(L)  Hematocrit 36.0 - 46.0 % 39.2 40.4 32.7(L)  Platelets 150 - 400 K/uL 150 162 274   Lipid Panel  No results found for: CHOL, TRIG, HDL, CHOLHDL, VLDL, LDLCALC, LDLDIRECT HEMOGLOBIN A1C No results found for: HGBA1C, MPG TSH No results for input(s): TSH in the last 8760 hours.  Cardiac Studies:   Echocardiogram- 03/18/2019 1. Normal LV systolic function with visual EF 65-70%. Left ventricle cavity is normal in size. Moderate concentric hypertrophy of the left ventricle.  Normal global wall motion. Doppler evidence of grade I (impaired) diastolic dysfunction. 2. Right ventricle cavity is normal in size. 3. Normal right ventricular function. 4. Left atrial cavity  is slightly dilated. 5. Mild (Grade I) aortic regurgitation. 6. Mild to moderate mitral regurgitation. 7. Mild to moderate tricuspid regurgitation. No evidence of pulmonary hypertension. 8. Mild to moderate pulmonic regurgitation.  Compared to  Echocardiogram 12/03/2018, right ventricular moderate enlargement and decreased function and moderate pulmonary hypertension is no longer noted.  Lexiscan Myoview Stress Test 03/17/2019: Stress EKG is non-diagnostic, as this is pharmacological stress test. Myocardial pefusion imaging is normal. Left ventricular ejection fraction is  66% with normal wall motion. Low risk study.  Vascular US Lower Extremity 12/03/2018: Right: Findings consistent with acute deep vein thrombosis involving the right popliteal vein, and right gastrocnemius vein. No cystic structure found in the popliteal fossa. Left: No evidence of common femoral vein obstruction.  Assessment   Acute deep vein thrombosis (DVT) of popliteal vein of left lower extremity ([place]) and PE 12/03/18   Bilateral pulmonary embolism ([facility])    Essential hypertension - Plan: labetalol (NORMODYNE) 200 MG tablet,    Coronary artery calcification seen on CT scan -   EKG 12/03/18/20: Normal sinus rhythm at rate of 85 bpm, left axis deviation, left can't fascicular block.  Incomplete right bundle branch block.  Nonspecific T abnormality.  Recommendations:   Patient presents to me for follow-up and evaluation of shortness of breath and also pulmonary embolism history and hypertension.  Fortunately the echocardiogram reveals improvement in RV function and also RV size.  She is now tolerating amlodipine which had increased from 5 mg to 10 mg, blood pressure is still elevated.  We will add labetalol 200 mg p.o. twice  daily.  I would like to see her back in about 8 to 10 weeks for follow-up of hypertension specifically.   With regard to pulmonary embolism and DVT, as this is the first episode of thromboembolic complication, she was also a heavy smoker which she has completely quit since DVT and PE, 6 months of therapy with anticoagulation may be sufficient and she can switch to aspirin alone.  With regard to coronary calcification noted, stress test reveals no significant CAD.  Continue primary prevention, she is on statin for hyperlipidemia and diabetes is diet controlled.  Adrian Prows, MD, [facility] 03/29/2019, 12:28 PM [facility] [place] Pager: [phone] Office: [phone] If no answer Cell [phone]

## 2019-03-31 DIAGNOSIS — I10 Essential (primary) hypertension: Secondary | ICD-10-CM | POA: Diagnosis not present

## 2019-03-31 DIAGNOSIS — E782 Mixed hyperlipidemia: Secondary | ICD-10-CM | POA: Diagnosis not present

## 2019-03-31 DIAGNOSIS — R7303 Prediabetes: Secondary | ICD-10-CM | POA: Diagnosis not present

## 2019-04-09 DIAGNOSIS — R600 Localized edema: Secondary | ICD-10-CM | POA: Diagnosis not present

## 2019-04-09 DIAGNOSIS — R351 Nocturia: Secondary | ICD-10-CM | POA: Diagnosis not present

## 2019-04-09 DIAGNOSIS — E782 Mixed hyperlipidemia: Secondary | ICD-10-CM | POA: Diagnosis not present

## 2019-04-09 DIAGNOSIS — G609 Hereditary and idiopathic neuropathy, unspecified: Secondary | ICD-10-CM | POA: Diagnosis not present

## 2019-04-09 DIAGNOSIS — I2699 Other pulmonary embolism without acute cor pulmonale: Secondary | ICD-10-CM | POA: Diagnosis not present

## 2019-04-09 DIAGNOSIS — N2889 Other specified disorders of kidney and ureter: Secondary | ICD-10-CM | POA: Diagnosis not present

## 2019-04-09 DIAGNOSIS — R7303 Prediabetes: Secondary | ICD-10-CM | POA: Diagnosis not present

## 2019-04-09 DIAGNOSIS — I10 Essential (primary) hypertension: Secondary | ICD-10-CM | POA: Diagnosis not present

## 2019-04-20 ENCOUNTER — Encounter (HOSPITAL_COMMUNITY): Payer: Self-pay

## 2019-04-20 ENCOUNTER — Emergency Department (HOSPITAL_COMMUNITY): Payer: Medicare Other

## 2019-04-20 ENCOUNTER — Other Ambulatory Visit: Payer: Self-pay

## 2019-04-20 ENCOUNTER — Emergency Department (HOSPITAL_COMMUNITY)
Admission: EM | Admit: 2019-04-20 | Discharge: 2019-04-20 | Disposition: A | Discharge status: Home / Self Care | Payer: Medicare Other | Attending: Emergency Medicine | Admitting: Emergency Medicine

## 2019-04-20 DIAGNOSIS — Z79899 Other long term (current) drug therapy: Secondary | ICD-10-CM | POA: Diagnosis not present

## 2019-04-20 DIAGNOSIS — I700 Atherosclerosis of aorta: Secondary | ICD-10-CM | POA: Diagnosis not present

## 2019-04-20 DIAGNOSIS — Z86711 Personal history of pulmonary embolism: Secondary | ICD-10-CM | POA: Diagnosis not present

## 2019-04-20 DIAGNOSIS — Z7984 Long term (current) use of oral hypoglycemic drugs: Secondary | ICD-10-CM | POA: Insufficient documentation

## 2019-04-20 DIAGNOSIS — R42 Dizziness and giddiness: Secondary | ICD-10-CM | POA: Insufficient documentation

## 2019-04-20 DIAGNOSIS — Z20828 Contact with and (suspected) exposure to other viral communicable diseases: Secondary | ICD-10-CM | POA: Diagnosis not present

## 2019-04-20 DIAGNOSIS — Z209 Contact with and (suspected) exposure to unspecified communicable disease: Secondary | ICD-10-CM | POA: Diagnosis not present

## 2019-04-20 DIAGNOSIS — Z7901 Long term (current) use of anticoagulants: Secondary | ICD-10-CM | POA: Insufficient documentation

## 2019-04-20 DIAGNOSIS — Z86718 Personal history of other venous thrombosis and embolism: Secondary | ICD-10-CM | POA: Diagnosis not present

## 2019-04-20 DIAGNOSIS — R0602 Shortness of breath: Secondary | ICD-10-CM | POA: Diagnosis not present

## 2019-04-20 DIAGNOSIS — R2243 Localized swelling, mass and lump, lower limb, bilateral: Secondary | ICD-10-CM | POA: Diagnosis not present

## 2019-04-20 DIAGNOSIS — E785 Hyperlipidemia, unspecified: Secondary | ICD-10-CM | POA: Insufficient documentation

## 2019-04-20 DIAGNOSIS — Z87891 Personal history of nicotine dependence: Secondary | ICD-10-CM | POA: Insufficient documentation

## 2019-04-20 DIAGNOSIS — I2510 Atherosclerotic heart disease of native coronary artery without angina pectoris: Secondary | ICD-10-CM | POA: Diagnosis not present

## 2019-04-20 DIAGNOSIS — R0902 Hypoxemia: Secondary | ICD-10-CM | POA: Diagnosis not present

## 2019-04-20 DIAGNOSIS — E1169 Type 2 diabetes mellitus with other specified complication: Secondary | ICD-10-CM | POA: Diagnosis not present

## 2019-04-20 LAB — BASIC METABOLIC PANEL
Anion gap: 9 (ref 5–15)
BUN: 14 mg/dL (ref 8–23)
CO2: 25 mmol/L (ref 22–32)
Calcium: 9.2 mg/dL (ref 8.9–10.3)
Chloride: 106 mmol/L (ref 98–111)
Creatinine, Ser: 0.86 mg/dL (ref 0.44–1.00)
GFR calc Af Amer: >60 mL/min (ref >60)
GFR calc non Af Amer: >60 mL/min (ref >60)
Glucose, Bld: 96 mg/dL (ref 70–99)
Potassium: 3.6 mmol/L (ref 3.5–5.1)
Sodium: 140 mmol/L (ref 135–145)

## 2019-04-20 LAB — PROTIME-INR
INR: 1.3 — ABNORMAL HIGH (ref 0.8–1.2)
Prothrombin Time: 16.1 seconds — ABNORMAL HIGH (ref 11.4–15.2)

## 2019-04-20 LAB — CBC WITH DIFFERENTIAL/PLATELET
Abs Immature Granulocytes: 0.03 10*3/uL (ref 0.00–0.07)
Basophils Absolute: 0.1 10*3/uL (ref 0.0–0.1)
Basophils Relative: 1 %
Eosinophils Absolute: 0.2 10*3/uL (ref 0.0–0.5)
Eosinophils Relative: 3 %
HCT: 37.6 % (ref 36.0–46.0)
Hemoglobin: 11.6 g/dL — ABNORMAL LOW (ref 12.0–15.0)
Immature Granulocytes: 0 %
Lymphocytes Relative: 35 %
Lymphs Abs: 2.6 10*3/uL (ref 0.7–4.0)
MCH: 27.4 pg (ref 26.0–34.0)
MCHC: 30.9 g/dL (ref 30.0–36.0)
MCV: 88.7 fL (ref 80.0–100.0)
Monocytes Absolute: 0.5 10*3/uL (ref 0.1–1.0)
Monocytes Relative: 7 %
Neutro Abs: 4 10*3/uL (ref 1.7–7.7)
Neutrophils Relative %: 54 %
Platelets: 221 10*3/uL (ref 150–400)
RBC: 4.24 MIL/uL (ref 3.87–5.11)
RDW: 14.6 % (ref 11.5–15.5)
WBC: 7.5 10*3/uL (ref 4.0–10.5)
nRBC: 0 % (ref 0.0–0.2)

## 2019-04-20 LAB — CBG MONITORING, ED: Glucose-Capillary: 86 mg/dL (ref 70–99)

## 2019-04-20 MED ORDER — SODIUM CHLORIDE (PF) 0.9 % IJ SOLN
INTRAMUSCULAR | Status: AC
  Filled 2019-04-20: qty 50

## 2019-04-20 MED ORDER — IOHEXOL 350 MG/ML SOLN
100.0000 mL | Freq: Once | INTRAVENOUS | Status: AC | PRN
  Administered 2019-04-20: 100 mL via INTRAVENOUS

## 2019-04-20 MED ORDER — IOHEXOL 300 MG/ML  SOLN: 100.0000 mL | Freq: Once | INTRAMUSCULAR | Status: DC | PRN

## 2019-04-20 NOTE — ED Provider Notes (Signed)
[facility] DEPT Provider Note   CSN: [id number] Arrival date & time: 04/20/19  1326     History   Chief Complaint Chief Complaint  Patient presents with   Shortness of Breath    HPI Kaitlin Arnold is a 83 y.o. female.  She has a history of coronary disease and was diagnosed with DVT and PE back in February.  She has been on Xarelto for that.  She is complaining of a week's worth of dyspnea on exertion and shortness of breath.  She notices it when walking to the mailbox or walking up a flight of stairs.  Is not associated with any chest pain and there is no cough sore throat fever nausea vomiting diarrhea.  She said she has had some leg swelling and some intermittent dizziness.  Her cardiologist added a blood pressure medicine last Friday but she thinks she was feeling the symptoms before that.  No sick contacts or recent travel.     The history is provided by the patient.  Shortness of Breath Severity:  Moderate Onset quality:  Gradual Duration:  1 week Timing:  Intermittent Progression:  Waxing and waning Chronicity:  New Context: activity   Relieved by:  Rest Worsened by:  Activity Ineffective treatments:  None tried Associated symptoms: no abdominal pain, no chest pain, no cough, no diaphoresis, no fever, no headaches, no hemoptysis, no neck pain, no rash, no sore throat, no sputum production, no syncope and no vomiting   Risk factors: hx of PE/DVT   Risk factors: no tobacco use     Past Medical History:  Diagnosis Date   Aortic atherosclerosis ([place]) 12/04/2018   Arthritis    oa and pain left knee;  previous right total knee replacement  1998   CAD (coronary artery disease) 12/04/2018   Chest pain 12/18/2018   GERD (gastroesophageal reflux disease)    seldom   H/O cardiovascular stress test 05/27/12   STRESS TEST WAS DONE for cardiac clearance for left total knee replacement--given clearance by dr. Einar Gip.   EF 71%, NO ISCHEMIA    Hyperlipidemia    Hypertension    Shortness of breath    with exertion   SOB (shortness of breath) 12/18/2018    Patient Active Problem List   Diagnosis Date Noted   SOB (shortness of breath) 12/18/2018   Chest pain 12/18/2018   Coronary artery calcification seen on CT scan 12/18/2018   Essential hypertension 12/18/2018   Former tobacco use 12/18/2018   Hyperlipidemia associated with type 2 diabetes mellitus ([place]) 12/18/2018   Aortic atherosclerosis ([place]) 12/04/2018   CAD (coronary artery disease) 12/04/2018   Left kidney mass 12/03/2018   Bilateral pulmonary embolism ([place]) 12/03/2018   Acute deep vein thrombosis (DVT) of right popliteal vein ([place]) 12/03/2018   Expected blood loss anemia 06/10/2012   UTI (urinary tract infection) 06/10/2012   S/P left TKA 06/09/2012    Past Surgical History:  Procedure Laterality Date   EYE SURGERY     HAND SURGERY Bilateral    HERNIA REPAIR     38 yrs ago   [place]   right total knee replacement   TOTAL KNEE ARTHROPLASTY  06/09/2012   Procedure: TOTAL KNEE ARTHROPLASTY;  Surgeon: Mauri Pole, MD;  Location: [facility] ORS;  Service: Orthopedics;  Laterality: Left;     OB History   No obstetric history on file.      Home Medications    Prior to Admission medications  Medication Sig Start Date End Date Taking? Authorizing Provider  acetaminophen (TYLENOL) 325 MG tablet Take 650 mg by mouth every 6 (six) hours as needed.    [provider]  amLODipine (NORVASC) 10 MG tablet Take 1 tablet (10 mg total) by mouth every morning. 02/12/19   Adrian Prows, MD  atorvastatin (LIPITOR) 20 MG tablet Take 20 mg by mouth every evening. 11/24/18   [provider]  cholecalciferol (VITAMIN D3) 25 MCG (1000 UT) tablet Take 1,000 Units by mouth daily.    [provider]  COMBIGAN 0.2-0.5 % ophthalmic solution Place 1 drop into both eyes every 12 (twelve) hours.  10/12/18   [provider]    labetalol (NORMODYNE) 200 MG tablet Take 1 tablet (200 mg total) by mouth 2 (two) times daily. 03/29/19   Adrian Prows, MD  losartan-hydrochlorothiazide (HYZAAR) 100-25 MG tablet Take 1 tablet by mouth daily.    [provider]  multivitamin-lutein (OCUVITE-LUTEIN) CAPS capsule Take 1 capsule by mouth daily.    [provider]  Polyethyl Glycol-Propyl Glycol (SYSTANE) 0.4-0.3 % SOLN Apply to eye daily as needed.    [provider]  rivaroxaban (XARELTO) 20 MG TABS tablet Take 20 mg by mouth daily with supper.    [provider]  triamcinolone cream (KENALOG) 0.1 % Apply 1 application topically 2 (two) times daily as needed (dermatitis).  11/10/18   [provider]    Family History Family History  Problem Relation Age of Onset   Diabetes Father    Heart attack Sister    Colon cancer Sister    Colon cancer Sister     Social History Social History   Tobacco Use   Smoking status: Former Smoker    Packs/day: 0.50    Years: 40.00    Pack years: 20.00    Types: Cigarettes    Quit date: 12/04/2018    Years since quitting: 0.3   Smokeless tobacco: Never Used   Tobacco comment: not everyday smoker--if smoking 4 to 5 cigs a day  Substance Use Topics   Alcohol use: Yes    Comment: very seldom   Drug use: No     Allergies   Patient has no known allergies.   Review of Systems Review of Systems  Constitutional: Negative for diaphoresis and fever.  HENT: Negative for sore throat.   Eyes: Negative for visual disturbance.  Respiratory: Positive for shortness of breath. Negative for cough, hemoptysis and sputum production.   Cardiovascular: Negative for chest pain and syncope.  Gastrointestinal: Negative for abdominal pain and vomiting.  Genitourinary: Negative for dysuria.  Musculoskeletal: Negative for neck pain.  Skin: Negative for rash.  Neurological: Positive for dizziness. Negative for headaches.     Physical Exam Updated  Vital Signs BP (!) 161/68 (BP Location: Left Arm)    Pulse (!) 55    Temp 98.8 F (37.1 C) (Oral)    Resp 19    Ht 5\' 6"  (1.676 m)    Wt 99.8 kg    SpO2 97%    BMI 35.51 kg/m   Physical Exam Vitals signs and nursing note reviewed.  Constitutional:      General: She is not in acute distress.    Appearance: She is well-developed.  HENT:     Head: Normocephalic and atraumatic.  Eyes:     Conjunctiva/sclera: Conjunctivae normal.  Neck:     Musculoskeletal: Neck supple.  Cardiovascular:     Rate and Rhythm: Normal rate and regular rhythm.  Heart sounds: No murmur.  Pulmonary:     Effort: Pulmonary effort is normal. No respiratory distress.     Breath sounds: Normal breath sounds.  Abdominal:     Palpations: Abdomen is soft.     Tenderness: There is no abdominal tenderness.  Musculoskeletal: Normal range of motion.     Right lower leg: She exhibits no tenderness.     Left lower leg: She exhibits no tenderness.  Skin:    General: Skin is warm and dry.     Capillary Refill: Capillary refill takes less than 2 seconds.  Neurological:     General: No focal deficit present.     Mental Status: She is alert.      ED Treatments / Results  Labs (all labs ordered are listed, but only abnormal results are displayed) Labs Reviewed  CBC WITH DIFFERENTIAL/PLATELET - Abnormal; Notable for the following components:      Result Value   Hemoglobin 11.6 (*)    All other components within normal limits  PROTIME-INR - Abnormal; Notable for the following components:   Prothrombin Time 16.1 (*)    INR 1.3 (*)    All other components within normal limits  NOVEL CORONAVIRUS, NAA (HOSPITAL ORDER, SEND-OUT TO REF LAB)  BASIC METABOLIC PANEL  CBG MONITORING, ED    EKG EKG Interpretation  Date/Time:  Tuesday April 20 2019 13:37:57 EDT Ventricular Rate:  54 PR Interval:    QRS Duration: 153 QT Interval:  450 QTC Calculation: 427 R Axis:   -13 Text Interpretation:  Sinus rhythm Probable  left atrial enlargement Right bundle branch block Left ventricular hypertrophy nonspecific changes compared with prior 2/20 Confirmed by Aletta Edouard [id number]) on 04/20/2019 3:19:15 PM   Radiology Dg Chest 2 View  Result Date: 04/20/2019 CLINICAL DATA:  Shortness of breath.  History of pulmonary embolus. EXAM: CHEST - 2 VIEW COMPARISON:  December 03, 2018 chest radiograph and chest CT FINDINGS: No evident edema or consolidation. Heart size and pulmonary vascularity are normal. No adenopathy. No bone lesions. IMPRESSION: No edema or consolidation.  Stable cardiac silhouette. Electronically Signed   By: Lowella Grip III M.D.   On: 04/20/2019 14:10   Ct Angio Chest Pe W/cm &/or Wo Cm  Result Date: 04/20/2019 CLINICAL DATA:  Shortness of breath, recent PE 12/03/2018 EXAM: CT ANGIOGRAPHY CHEST WITH CONTRAST TECHNIQUE: Multidetector CT imaging of the chest was performed using the standard protocol during bolus administration of intravenous contrast. Multiplanar CT image reconstructions and MIPs were obtained to evaluate the vascular anatomy. CONTRAST:  120mL OMNIPAQUE IOHEXOL 350 MG/ML SOLN, <See Chart> OMNIPAQUE IOHEXOL 300 MG/ML SOLN COMPARISON:  12/03/2018 FINDINGS: Cardiovascular: Examination for pulmonary embolism is somewhat limited by breath motion artifact. Within this limitation, there has been at least near-total resolution of previously seen extensive bilateral pulmonary embolism, as proximal as the right pulmonary artery. There is no evidence of persistent or recurrent embolism through the segmental pulmonary arterial level. Subsegmental embolus cannot be excluded due to motion. Cardiomegaly and three-vessel coronary artery calcifications. Aortic atherosclerosis. No pericardial effusion. Mediastinum/Nodes: No enlarged mediastinal, hilar, or axillary lymph nodes. Thyroid gland, trachea, and esophagus demonstrate no significant findings. Lungs/Pleura: Bibasilar scarring and partial atelectasis. No  pleural effusion or pneumothorax. Upper Abdomen: No acute abnormality. Musculoskeletal: No chest wall abnormality. No acute or significant osseous findings. Review of the MIP images confirms the above findings. IMPRESSION: 1. Examination for pulmonary embolism is somewhat limited by breath motion artifact. Within this limitation, there has been at least near-total  resolution of previously seen extensive bilateral pulmonary embolism, previously seen as proximal as the right pulmonary artery. There is no evidence of persistent or recurrent embolism through the segmental pulmonary arterial level. Subsegmental embolus cannot be excluded due to motion. 2.  Cardiomegaly and three-vessel coronary artery calcifications. 3.  Aortic atherosclerosis. Electronically Signed   By: Eddie Candle M.D.   On: 04/20/2019 18:33    Procedures Procedures (including critical care time)  Medications Ordered in ED Medications  iohexol (OMNIPAQUE) 350 MG/ML injection 100 mL (100 mLs Intravenous Contrast Given 04/20/19 1751)     Initial Impression / Assessment and Plan / ED Course  I have reviewed the triage vital signs and the nursing notes.  Pertinent labs & imaging results that were available during my care of the patient were reviewed by me and considered in my medical decision making (see chart for details).  Clinical Course as of Apr 19 2328  Tue Jul 07, 399  6038 83 year old female here with increased shortness of breath over the course of a week.  This is in the setting of her PCP and cardiologist adjusting some blood pressure medications.  Sats good here.  CT shows resolution of her prior PE and no evidence of acute PE.  Patient comfortable going home and I recommended that she contact her PCP and cardiologist has this shortness of breath may related to her new medications.   [MB]    Clinical Course User Index [MB] Hayden Rasmussen, MD   [name] was evaluated in Emergency Department on 04/20/2019 for the  symptoms described in the history of present illness. She was evaluated in the context of the global COVID-19 pandemic, which necessitated consideration that the patient might be at risk for infection with the SARS-CoV-2 virus that causes COVID-19. Institutional protocols and algorithms that pertain to the evaluation of patients at risk for COVID-19 are in a state of rapid change based on information released by regulatory bodies including the CDC and federal and state organizations. These policies and algorithms were followed during the patient's care in the ED.      Final Clinical Impressions(s) / ED Diagnoses   Final diagnoses:  SOB (shortness of breath)    ED Discharge Orders    None       Hayden Rasmussen, MD 04/20/19 2329

## 2019-04-20 NOTE — ED Triage Notes (Addendum)
Pt BIBA from home. Pt c/o increasing SHOB x 2 weeks. Denies sore throat, cough,fever. Dx with clot in right thigh and both lungs in February. Was put on Xarelto for this. Has not been seen since.  Pt endorses dizziness and leg swelling.

## 2019-04-20 NOTE — ED Notes (Signed)
Pt ambulatory to and from restroom, no assistance needed.

## 2019-04-20 NOTE — Discharge Instructions (Addendum)
You were seen in the emergency department for shortness of breath.  You had an EKG chest x-ray lab work and a CAT scan of your chest that did not show an obvious explanation for your symptoms.  Please follow-up with your primary care doctor soon as this may be related to your new medication changes.  Please return if any worsening symptoms.

## 2019-04-21 DIAGNOSIS — Z7189 Other specified counseling: Secondary | ICD-10-CM | POA: Diagnosis not present

## 2019-04-21 DIAGNOSIS — R600 Localized edema: Secondary | ICD-10-CM | POA: Diagnosis not present

## 2019-04-21 DIAGNOSIS — I10 Essential (primary) hypertension: Secondary | ICD-10-CM | POA: Diagnosis not present

## 2019-04-21 DIAGNOSIS — J301 Allergic rhinitis due to pollen: Secondary | ICD-10-CM | POA: Diagnosis not present

## 2019-04-21 DIAGNOSIS — R0602 Shortness of breath: Secondary | ICD-10-CM | POA: Diagnosis not present

## 2019-04-21 DIAGNOSIS — N182 Chronic kidney disease, stage 2 (mild): Secondary | ICD-10-CM | POA: Diagnosis not present

## 2019-04-21 DIAGNOSIS — R351 Nocturia: Secondary | ICD-10-CM | POA: Diagnosis not present

## 2019-04-21 LAB — NOVEL CORONAVIRUS, NAA (HOSP ORDER, SEND-OUT TO REF LAB; TAT 18-24 HRS): SARS-CoV-2, NAA: NOT DETECTED

## 2019-04-26 DIAGNOSIS — H353131 Nonexudative age-related macular degeneration, bilateral, early dry stage: Secondary | ICD-10-CM | POA: Diagnosis not present

## 2019-04-26 DIAGNOSIS — H35033 Hypertensive retinopathy, bilateral: Secondary | ICD-10-CM | POA: Diagnosis not present

## 2019-04-26 DIAGNOSIS — H401111 Primary open-angle glaucoma, right eye, mild stage: Secondary | ICD-10-CM | POA: Diagnosis not present

## 2019-04-26 DIAGNOSIS — H11153 Pinguecula, bilateral: Secondary | ICD-10-CM | POA: Diagnosis not present

## 2019-04-26 DIAGNOSIS — H401122 Primary open-angle glaucoma, left eye, moderate stage: Secondary | ICD-10-CM | POA: Diagnosis not present

## 2019-04-26 DIAGNOSIS — H16223 Keratoconjunctivitis sicca, not specified as Sjogren's, bilateral: Secondary | ICD-10-CM | POA: Diagnosis not present

## 2019-04-26 DIAGNOSIS — H0102A Squamous blepharitis right eye, upper and lower eyelids: Secondary | ICD-10-CM | POA: Diagnosis not present

## 2019-04-26 DIAGNOSIS — H18413 Arcus senilis, bilateral: Secondary | ICD-10-CM | POA: Diagnosis not present

## 2019-04-26 DIAGNOSIS — E119 Type 2 diabetes mellitus without complications: Secondary | ICD-10-CM | POA: Diagnosis not present

## 2019-04-26 DIAGNOSIS — H0102B Squamous blepharitis left eye, upper and lower eyelids: Secondary | ICD-10-CM | POA: Diagnosis not present

## 2019-05-05 DIAGNOSIS — R0602 Shortness of breath: Secondary | ICD-10-CM | POA: Diagnosis not present

## 2019-05-05 DIAGNOSIS — I10 Essential (primary) hypertension: Secondary | ICD-10-CM | POA: Diagnosis not present

## 2019-05-05 DIAGNOSIS — Z7189 Other specified counseling: Secondary | ICD-10-CM | POA: Diagnosis not present

## 2019-05-05 DIAGNOSIS — E782 Mixed hyperlipidemia: Secondary | ICD-10-CM | POA: Diagnosis not present

## 2019-05-05 DIAGNOSIS — R5383 Other fatigue: Secondary | ICD-10-CM | POA: Diagnosis not present

## 2019-06-02 DIAGNOSIS — H35033 Hypertensive retinopathy, bilateral: Secondary | ICD-10-CM | POA: Diagnosis not present

## 2019-06-02 DIAGNOSIS — E119 Type 2 diabetes mellitus without complications: Secondary | ICD-10-CM | POA: Diagnosis not present

## 2019-06-02 DIAGNOSIS — H18413 Arcus senilis, bilateral: Secondary | ICD-10-CM | POA: Diagnosis not present

## 2019-06-02 DIAGNOSIS — H0102B Squamous blepharitis left eye, upper and lower eyelids: Secondary | ICD-10-CM | POA: Diagnosis not present

## 2019-06-02 DIAGNOSIS — H16223 Keratoconjunctivitis sicca, not specified as Sjogren's, bilateral: Secondary | ICD-10-CM | POA: Diagnosis not present

## 2019-06-02 DIAGNOSIS — H11153 Pinguecula, bilateral: Secondary | ICD-10-CM | POA: Diagnosis not present

## 2019-06-02 DIAGNOSIS — H5789 Other specified disorders of eye and adnexa: Secondary | ICD-10-CM | POA: Diagnosis not present

## 2019-06-02 DIAGNOSIS — H401122 Primary open-angle glaucoma, left eye, moderate stage: Secondary | ICD-10-CM | POA: Diagnosis not present

## 2019-06-02 DIAGNOSIS — H353131 Nonexudative age-related macular degeneration, bilateral, early dry stage: Secondary | ICD-10-CM | POA: Diagnosis not present

## 2019-06-02 DIAGNOSIS — H401111 Primary open-angle glaucoma, right eye, mild stage: Secondary | ICD-10-CM | POA: Diagnosis not present

## 2019-06-02 DIAGNOSIS — H0102A Squamous blepharitis right eye, upper and lower eyelids: Secondary | ICD-10-CM | POA: Diagnosis not present

## 2019-06-10 ENCOUNTER — Ambulatory Visit (INDEPENDENT_AMBULATORY_CARE_PROVIDER_SITE_OTHER): Payer: Medicare Other | Admitting: Cardiology

## 2019-06-10 ENCOUNTER — Encounter: Payer: Self-pay | Admitting: Cardiology

## 2019-06-10 ENCOUNTER — Other Ambulatory Visit: Payer: Self-pay

## 2019-06-10 VITALS — BP 138/66 | HR 67 | Ht 66.0 in | Wt 227.0 lb

## 2019-06-10 DIAGNOSIS — Z87891 Personal history of nicotine dependence: Secondary | ICD-10-CM | POA: Diagnosis not present

## 2019-06-10 DIAGNOSIS — I10 Essential (primary) hypertension: Secondary | ICD-10-CM

## 2019-06-10 DIAGNOSIS — I2510 Atherosclerotic heart disease of native coronary artery without angina pectoris: Secondary | ICD-10-CM

## 2019-06-10 DIAGNOSIS — I82432 Acute embolism and thrombosis of left popliteal vein: Secondary | ICD-10-CM | POA: Diagnosis not present

## 2019-06-10 DIAGNOSIS — R0602 Shortness of breath: Secondary | ICD-10-CM

## 2019-06-10 NOTE — Progress Notes (Signed)
Primary Physician/Referring:  Merrilee Seashore, MD  Patient ID: Kaitlin Arnold, female    DOB: 1936-07-24, 83 y.o.   MRN: [id number]  Chief Complaint  Patient presents with  . Coronary Artery Disease  . Hypertension  . Follow-up    HPI: Kaitlin Arnold  is a 83 y.o. female  with  hypertension, hyperlipidemia, type 2 diabetes, tobacco use for 30 years, hospital admission on 12/03/2018 for right popliteal DVT and bilateral PE with RV strain and coronary calcification. Now on Xarelto. Spontaneous DVT and PE.  She has quit smoking since. Past medical history significant for hypertension, hyperlipidemia and diet controlled diabetes mellitus.  I had seen her on a virtual visit 6 weeks ago, Blood pressure was uncontrolled and she now presents follow-up. I had added labetalol 200 mg p.o. b.i.d., this was reduced to 100 mg daily by her PCP due to marked fatigue.  She is now tolerating 100 mg daily and states that she feels well.  She has mild chronic dyspnea the same and unchanged, she has remained abstinent from tobacco use.  She has started to gain weight since then.  She was incidentally found to have left kidney mass and underwent MRI of abdomen on 12/16/2018 revealing bilateral renal lesions suspicious renal neoplasms and Urology recommended rechecking MRI in September 2020, thought to be benign.  Past Medical History:  Diagnosis Date  . Aortic atherosclerosis ([place]) 12/04/2018  . Arthritis    oa and pain left knee;  previous right total knee replacement  1998  . CAD (coronary artery disease) 12/04/2018  . Chest pain 12/18/2018  . GERD (gastroesophageal reflux disease)    seldom  . H/O cardiovascular stress test 05/27/12   STRESS TEST WAS DONE for cardiac clearance for left total knee replacement--given clearance by dr. Einar Gip.   EF 71%, NO ISCHEMIA  . Hyperlipidemia   . Hypertension   . Shortness of breath    with exertion  . SOB (shortness of breath) 12/18/2018    Past Surgical History:   Procedure Laterality Date  . EYE SURGERY    . HAND SURGERY Bilateral   . HERNIA REPAIR     38 yrs ago  . JOINT REPLACEMENT  1998   right total knee replacement  . TOTAL KNEE ARTHROPLASTY  06/09/2012   Procedure: TOTAL KNEE ARTHROPLASTY;  Surgeon: Mauri Pole, MD;  Location: [facility];  Service: Orthopedics;  Laterality: Left;    Social History   Socioeconomic History  . Marital status: Widowed    Spouse name: Not on file  . Number of children: 3  . Years of education: Not on file  . Highest education level: Not on file  Occupational History  . Not on file  Social Needs  . Financial resource strain: Not on file  . Food insecurity    Worry: Not on file    Inability: Not on file  . Transportation needs    Medical: Not on file    Non-medical: Not on file  Tobacco Use  . Smoking status: Former Smoker    Packs/day: 0.50    Years: 40.00    Pack years: 20.00    Types: Cigarettes    Quit date: 12/04/2018    Years since quitting: 0.5  . Smokeless tobacco: Never Used  . Tobacco comment: not everyday smoker--if smoking 4 to 5 cigs a day  Substance and Sexual Activity  . Alcohol use: Yes    Comment: very seldom  . Drug use: No  .  Sexual activity: Not on file  Lifestyle  . Physical activity    Days per week: Not on file    Minutes per session: Not on file  . Stress: Not on file  Relationships  . Social Herbalist on phone: Not on file    Gets together: Not on file    Attends religious service: Not on file    Active member of club or organization: Not on file    Attends meetings of clubs or organizations: Not on file    Relationship status: Not on file  . Intimate partner violence    Fear of current or ex partner: Not on file    Emotionally abused: Not on file    Physically abused: Not on file    Forced sexual activity: Not on file  Other Topics Concern  . Not on file  Social History Narrative  . Not on file    Current Outpatient Medications on File  Prior to Visit  Medication Sig Dispense Refill  . acetaminophen (TYLENOL) 325 MG tablet Take 650 mg by mouth every 6 (six) hours as needed for moderate pain.     Marland Kitchen amLODipine (NORVASC) 10 MG tablet Take 1 tablet (10 mg total) by mouth every morning. 90 tablet 1  . atorvastatin (LIPITOR) 20 MG tablet Take 20 mg by mouth every evening.    . cholecalciferol (VITAMIN D3) 25 MCG (1000 UT) tablet Take 1,000 Units by mouth daily.    . COMBIGAN 0.2-0.5 % ophthalmic solution Place 1 drop into both eyes every 12 (twelve) hours.     . hydrochlorothiazide (HYDRODIURIL) 25 MG tablet Take 25 mg by mouth daily.    Marland Kitchen labetalol (NORMODYNE) 200 MG tablet Take 1 tablet (200 mg total) by mouth 2 (two) times daily. (Patient taking differently: Take 100 mg by mouth once. ) 60 tablet 3  . losartan (COZAAR) 100 MG tablet Take 100 mg by mouth daily.    . multivitamin-lutein (OCUVITE-LUTEIN) CAPS capsule Take 1 capsule by mouth daily.    Vladimir Faster Glycol-Propyl Glycol (SYSTANE) 0.4-0.3 % SOLN Apply 1 drop to eye 3 (three) times daily as needed (dry eyes).     . rivaroxaban (XARELTO) 20 MG TABS tablet Take 20 mg by mouth daily with supper.    . triamcinolone cream (KENALOG) 0.1 % Apply 1 application topically 2 (two) times daily as needed (dermatitis).      No current facility-administered medications on file prior to visit.    Review of Systems  Constitution: Negative for decreased appetite, malaise/fatigue, weight gain and weight loss.  Eyes: Positive for photophobia. Negative for visual disturbance.  Cardiovascular: Positive for dyspnea on exertion and leg swelling (chronic). Negative for chest pain, claudication, orthopnea, palpitations and syncope.  Respiratory: Negative for hemoptysis and wheezing.   Endocrine: Negative for cold intolerance and heat intolerance.  Hematologic/Lymphatic: Does not bruise/bleed easily.  Skin: Negative for nail changes.  Musculoskeletal: Positive for arthritis. Negative for muscle  weakness and myalgias.  Gastrointestinal: Negative for abdominal pain, change in bowel habit, nausea and vomiting.  Neurological: Negative for difficulty with concentration, dizziness, focal weakness and headaches.  Psychiatric/Behavioral: Negative for altered mental status and suicidal ideas.  All other systems reviewed and are negative.  Objective  Blood pressure 138/66, pulse 67, height 5\' 6"  (1.676 m), weight 227 lb (103 kg), SpO2 98 %. Body mass index is 36.64 kg/m. Physical Exam  Constitutional: She is oriented to person, place, and time. Vital signs are normal. She  appears well-developed and well-nourished.  HENT:  Head: Normocephalic and atraumatic.  Neck: Normal range of motion.  Cardiovascular: Normal rate, regular rhythm, normal heart sounds and intact distal pulses.  Pulses:      Femoral pulses are 2+ on the right side and 2+ on the left side.      Popliteal pulses are 1+ on the right side and 1+ on the left side.       Dorsalis pedis pulses are 2+ on the right side and 2+ on the left side.       Posterior tibial pulses are 1+ on the right side.  Varicose veins on left foot.  2+ pitting leg edema bilateral  Pulmonary/Chest: Effort normal. No accessory muscle usage. No respiratory distress.  Scattered ronchi and wheezing bilateral  Abdominal: Soft. Bowel sounds are normal.  Musculoskeletal: Normal range of motion.  Neurological: She is alert and oriented to person, place, and time.  Skin: Skin is warm and dry.  Vitals reviewed.     Radiology:  CTA of chest 12/03/2018: 1. Positive for acute PE with CTevidence of right heart strain (RV/LV Ratio = 1.3) consistent with at least submassive (intermediate risk) PE. The presence of right heart strain has been associated with an increased risk of morbidity and mortality. 2. Coronary artery and aortic calcific atherosclerosis. Aortic Atherosclerosis (ICD10-I70.0). 3. Indeterminate left kidney upper pole 24 mm mass. Further  characterization with renal protocol CT or MRI with and without contrast is recommended on a nonemergent basis. 4. 17 mm right adrenal nodule measuring -45 HU, possibly a myelolipoma or adenoma with volume averaging.  MRI of abdomen 12/16/2018:  1. 2.7 cm cystic lesion projecting off the inferior pole of the right kidney has a solid nodular enhancing component worrisome for a cystic renal neoplasm. 2. 2 cm medial upper pole left renal lesion is solid and enhancing and consistent with a renal cell neoplasm. The adjacent 2.7 x 2.5 cm more lateral lesion is a benign hemorrhagic cyst. 3. Simple left renal cysts are noted and there are also 2 small hemorrhagic cyst associated with the right kidney. 4. No abdominal lymphadenopathy.  The renal veins are patent. 5. Small adrenal gland adenomas. 6. Recommend urology consultation for the bilateral suspected renal neoplasms.  Laboratory examination:   CMP Latest Ref Rng & Units 04/20/2019 12/04/2018 12/03/2018  Glucose 70 - 99 mg/dL 96 104(H) 109(H)  BUN 8 - 23 mg/dL 14 16 16   Creatinine 0.44 - 1.00 mg/dL 0.86 0.78 0.80  Sodium 135 - 145 mmol/L 140 139 139  Potassium 3.5 - 5.1 mmol/L 3.6 3.3(L) 3.6  Chloride 98 - 111 mmol/L 106 107 105  CO2 22 - 32 mmol/L 25 24 26   Calcium 8.9 - 10.3 mg/dL 9.2 9.3 9.4  Total Protein 6.5 - 8.1 g/dL - - 7.2  Total Bilirubin 0.3 - 1.2 mg/dL - - 0.6  Alkaline Phos 38 - 126 U/L - - 98  AST 15 - 41 U/L - - 14(L)  ALT 0 - 44 U/L - - 17   CBC Latest Ref Rng & Units 04/20/2019 12/04/2018 12/03/2018  WBC 4.0 - 10.5 K/uL 7.5 9.0 7.3  Hemoglobin 12.0 - 15.0 g/dL 11.6(L) 12.5 13.0  Hematocrit 36.0 - 46.0 % 37.6 39.2 40.4  Platelets 150 - 400 K/uL 221 150 162   Lipid Panel  No results found for: CHOL, TRIG, HDL, CHOLHDL, VLDL, LDLCALC, LDLDIRECT HEMOGLOBIN A1C No results found for: HGBA1C, MPG TSH No results for input(s): TSH in the last  8760 hours.  Cardiac Studies:   Echocardiogram- 03/18/2019 1. Normal LV systolic  function with visual EF 65-70%. Left ventricle cavity is normal in size. Moderate concentric hypertrophy of the left ventricle. Normal global wall motion. Doppler evidence of grade I (impaired) diastolic dysfunction. 2. Right ventricle cavity is normal in size. 3. Normal right ventricular function. 4. Left atrial cavity is slightly dilated. 5. Mild (Grade I) aortic regurgitation. 6. Mild to moderate mitral regurgitation. 7. Mild to moderate tricuspid regurgitation. No evidence of pulmonary hypertension. 8. Mild to moderate pulmonic regurgitation.  Compared to  Echocardiogram 12/03/2018, right ventricular moderate enlargement and decreased function and moderate pulmonary hypertension is no longer noted.  Lexiscan Myoview Stress Test 03/17/2019: Stress EKG is non-diagnostic, as this is pharmacological stress test. Myocardial pefusion imaging is normal. Left ventricular ejection fraction is  66% with normal wall motion. Low risk study.  Vascular US Lower Extremity 12/03/2018: Right: Findings consistent with acute deep vein thrombosis involving the right popliteal vein, and right gastrocnemius vein. No cystic structure found in the popliteal fossa. Left: No evidence of common femoral vein obstruction.  Assessment   Acute deep vein thrombosis (DVT) of popliteal vein of left lower extremity ([place]) and PE 12/03/18   Bilateral pulmonary embolism ([facility])    Essential hypertension - Plan: labetalol (NORMODYNE) 200 MG tablet,    Coronary artery calcification seen on CT scan -   EKG 12/03/18/20: Normal sinus rhythm at rate of 85 bpm, left axis deviation, left can't fascicular block.  Incomplete right bundle branch block.  Nonspecific T abnormality.  Recommendations:   Patient presents to me for follow-up and evaluation of shortness of breath and also pulmonary embolism history (Feb 2020) and hypertension.   She was also a heavy smoker which she has completely quit since DVT and PE, 6 months of  therapy with anticoagulation is sufficient and she can switch to aspirin alone. Repeat echocardiogram on 03/17/2020 revealed normalization of LV size and function suggesting probable perfusion of and resolution of PE. Since quitting smoking, she has been gaining weight and I warned about this.  She is now tolerating amlodipine 10 mg, and labetalol 200 mg  1/2 tablet once daily. I would recommend changing the dose to b.i.d. dosing as it is short-acting.  She will discuss this with Dr. Ashby Dawes. Leg edema is unchanged.  With regard to coronary calcification noted, stress test reveals no significant Myocardial ischemia.  Continue primary prevention, she is on statin for hyperlipidemia and diabetes is diet controlled. I will see her PRN.  Adrian Prows, MD, [facility] 06/10/2019, 11:38 AM [facility]. [place] Pager: [phone] Office: [phone] If no answer Cell [phone]

## 2019-06-14 DIAGNOSIS — Z23 Encounter for immunization: Secondary | ICD-10-CM | POA: Diagnosis not present

## 2019-06-14 DIAGNOSIS — R5383 Other fatigue: Secondary | ICD-10-CM | POA: Diagnosis not present

## 2019-06-14 DIAGNOSIS — I10 Essential (primary) hypertension: Secondary | ICD-10-CM | POA: Diagnosis not present

## 2019-06-14 DIAGNOSIS — M810 Age-related osteoporosis without current pathological fracture: Secondary | ICD-10-CM | POA: Diagnosis not present

## 2019-06-14 DIAGNOSIS — Z0000 Encounter for general adult medical examination without abnormal findings: Secondary | ICD-10-CM | POA: Diagnosis not present

## 2019-06-14 DIAGNOSIS — E782 Mixed hyperlipidemia: Secondary | ICD-10-CM | POA: Diagnosis not present

## 2019-06-14 DIAGNOSIS — M8589 Other specified disorders of bone density and structure, multiple sites: Secondary | ICD-10-CM | POA: Diagnosis not present

## 2019-06-14 DIAGNOSIS — Z7189 Other specified counseling: Secondary | ICD-10-CM | POA: Diagnosis not present

## 2019-06-14 DIAGNOSIS — R7303 Prediabetes: Secondary | ICD-10-CM | POA: Diagnosis not present

## 2019-06-14 DIAGNOSIS — N182 Chronic kidney disease, stage 2 (mild): Secondary | ICD-10-CM | POA: Diagnosis not present

## 2019-06-15 DIAGNOSIS — N182 Chronic kidney disease, stage 2 (mild): Secondary | ICD-10-CM | POA: Diagnosis not present

## 2019-06-15 DIAGNOSIS — N2889 Other specified disorders of kidney and ureter: Secondary | ICD-10-CM | POA: Diagnosis not present

## 2019-06-15 DIAGNOSIS — I700 Atherosclerosis of aorta: Secondary | ICD-10-CM | POA: Diagnosis not present

## 2019-06-15 DIAGNOSIS — R7303 Prediabetes: Secondary | ICD-10-CM | POA: Diagnosis not present

## 2019-06-15 DIAGNOSIS — I82431 Acute embolism and thrombosis of right popliteal vein: Secondary | ICD-10-CM | POA: Diagnosis not present

## 2019-06-15 DIAGNOSIS — I10 Essential (primary) hypertension: Secondary | ICD-10-CM | POA: Diagnosis not present

## 2019-06-15 DIAGNOSIS — Z7189 Other specified counseling: Secondary | ICD-10-CM | POA: Diagnosis not present

## 2019-06-15 DIAGNOSIS — I2699 Other pulmonary embolism without acute cor pulmonale: Secondary | ICD-10-CM | POA: Diagnosis not present

## 2019-06-15 DIAGNOSIS — M810 Age-related osteoporosis without current pathological fracture: Secondary | ICD-10-CM | POA: Diagnosis not present

## 2019-06-15 DIAGNOSIS — E782 Mixed hyperlipidemia: Secondary | ICD-10-CM | POA: Diagnosis not present

## 2019-06-15 DIAGNOSIS — G609 Hereditary and idiopathic neuropathy, unspecified: Secondary | ICD-10-CM | POA: Diagnosis not present

## 2019-06-22 ENCOUNTER — Other Ambulatory Visit: Payer: Self-pay

## 2019-06-22 ENCOUNTER — Ambulatory Visit
Admission: RE | Admit: 2019-06-22 | Discharge: 2019-06-22 | Disposition: A | Discharge status: Home / Self Care | Payer: Medicare Other | Source: Ambulatory Visit | Attending: Urology | Admitting: Urology

## 2019-06-22 DIAGNOSIS — D3000 Benign neoplasm of unspecified kidney: Secondary | ICD-10-CM

## 2019-06-22 DIAGNOSIS — N289 Disorder of kidney and ureter, unspecified: Secondary | ICD-10-CM | POA: Diagnosis not present

## 2019-06-22 DIAGNOSIS — N281 Cyst of kidney, acquired: Secondary | ICD-10-CM | POA: Diagnosis not present

## 2019-06-22 MED ORDER — GADOBENATE DIMEGLUMINE 529 MG/ML IV SOLN
20.0000 mL | Freq: Once | INTRAVENOUS | Status: AC | PRN
  Administered 2019-06-22: 20 mL via INTRAVENOUS

## 2019-06-29 DIAGNOSIS — G609 Hereditary and idiopathic neuropathy, unspecified: Secondary | ICD-10-CM | POA: Diagnosis not present

## 2019-06-29 DIAGNOSIS — N182 Chronic kidney disease, stage 2 (mild): Secondary | ICD-10-CM | POA: Diagnosis not present

## 2019-06-29 DIAGNOSIS — R3915 Urgency of urination: Secondary | ICD-10-CM | POA: Diagnosis not present

## 2019-06-29 DIAGNOSIS — I10 Essential (primary) hypertension: Secondary | ICD-10-CM | POA: Diagnosis not present

## 2019-06-29 DIAGNOSIS — E782 Mixed hyperlipidemia: Secondary | ICD-10-CM | POA: Diagnosis not present

## 2019-06-29 DIAGNOSIS — Z7189 Other specified counseling: Secondary | ICD-10-CM | POA: Diagnosis not present

## 2019-06-29 DIAGNOSIS — R600 Localized edema: Secondary | ICD-10-CM | POA: Diagnosis not present

## 2019-06-30 ENCOUNTER — Telehealth: Payer: Self-pay | Admitting: Hematology and Oncology

## 2019-06-30 NOTE — Telephone Encounter (Signed)
Received a new hem referral from Dr. Ashby Dawes for history of PE/DVT. Kaitlin Arnold has been cld and scheduled to see Dr. Lindi Adie on 9/30 at 345pm. SHe's been made aware to arrive 15 minutes early.

## 2019-07-13 NOTE — Progress Notes (Signed)
[facility] CONSULT NOTE  Patient Care Team: Merrilee Seashore, MD as PCP - General (Internal Medicine)  CHIEF COMPLAINTS/PURPOSE OF CONSULTATION:  History of PE and DVT  HISTORY OF PRESENTING ILLNESS:  Kaitlin Arnold 83 y.o. female is here because of a history of recurrent PE and DVT. She is referred by Dr. Ashby Dawes. She was initially diagnosed with a right thigh DVT and bilateral pulmonary emboli in 11/2018 after presenting to the ED for shortness of breath. She was started on Xarelto. On 04/20/19 she presented to the ED for shortness of breath x2 weeks. CTA showed near-total resolution of previous bilateral pulmonary emboli and no evidence of persistent or recurrent embolism. She presents to the clinic today for initial evaluation and discussion of anticoagulation.    I reviewed her records extensively and collaborated the history with the patient.  MEDICAL HISTORY:  Past Medical History:  Diagnosis Date  . Aortic atherosclerosis ([place]) 12/04/2018  . Arthritis    oa and pain left knee;  previous right total knee replacement  1998  . CAD (coronary artery disease) 12/04/2018  . Chest pain 12/18/2018  . GERD (gastroesophageal reflux disease)    seldom  . H/O cardiovascular stress test 05/27/12   STRESS TEST WAS DONE for cardiac clearance for left total knee replacement--given clearance by dr. Einar Gip.   EF 71%, NO ISCHEMIA  . Hyperlipidemia   . Hypertension   . Shortness of breath    with exertion  . SOB (shortness of breath) 12/18/2018    SURGICAL HISTORY: Past Surgical History:  Procedure Laterality Date  . EYE SURGERY    . HAND SURGERY Bilateral   . HERNIA REPAIR     38 yrs ago  . JOINT REPLACEMENT  1998   right total knee replacement  . TOTAL KNEE ARTHROPLASTY  06/09/2012   Procedure: TOTAL KNEE ARTHROPLASTY;  Surgeon: Mauri Pole, MD;  Location: [facility];  Service: Orthopedics;  Laterality: Left;    SOCIAL HISTORY: Social History   Socioeconomic History   . Marital status: Widowed    Spouse name: Not on file  . Number of children: 3  . Years of education: Not on file  . Highest education level: Not on file  Occupational History  . Not on file  Social Needs  . Financial resource strain: Not on file  . Food insecurity    Worry: Not on file    Inability: Not on file  . Transportation needs    Medical: Not on file    Non-medical: Not on file  Tobacco Use  . Smoking status: Former Smoker    Packs/day: 0.50    Years: 40.00    Pack years: 20.00    Types: Cigarettes    Quit date: 12/04/2018    Years since quitting: 0.6  . Smokeless tobacco: Never Used  . Tobacco comment: not everyday smoker--if smoking 4 to 5 cigs a day  Substance and Sexual Activity  . Alcohol use: Yes    Comment: very seldom  . Drug use: No  . Sexual activity: Not on file  Lifestyle  . Physical activity    Days per week: Not on file    Minutes per session: Not on file  . Stress: Not on file  Relationships  . Social Herbalist on phone: Not on file    Gets together: Not on file    Attends religious service: Not on file    Active member of club or organization: Not  on file    Attends meetings of clubs or organizations: Not on file    Relationship status: Not on file  . Intimate partner violence    Fear of current or ex partner: Not on file    Emotionally abused: Not on file    Physically abused: Not on file    Forced sexual activity: Not on file  Other Topics Concern  . Not on file  Social History Narrative  . Not on file    FAMILY HISTORY: Family History  Problem Relation Age of Onset  . Diabetes Father   . Heart attack Sister   . Colon cancer Sister   . Colon cancer Sister     ALLERGIES:  has No Known Allergies.  MEDICATIONS:  Current Outpatient Medications  Medication Sig Dispense Refill  . acetaminophen (TYLENOL) 325 MG tablet Take 650 mg by mouth every 6 (six) hours as needed for moderate pain.     Marland Kitchen amLODipine (NORVASC) 10  MG tablet Take 1 tablet (10 mg total) by mouth every morning. 90 tablet 1  . atorvastatin (LIPITOR) 20 MG tablet Take 20 mg by mouth every evening.    . cholecalciferol (VITAMIN D3) 25 MCG (1000 UT) tablet Take 1,000 Units by mouth daily.    . COMBIGAN 0.2-0.5 % ophthalmic solution Place 1 drop into both eyes every 12 (twelve) hours.     . hydrochlorothiazide (HYDRODIURIL) 25 MG tablet Take 25 mg by mouth daily.    Marland Kitchen labetalol (NORMODYNE) 200 MG tablet Take 1 tablet (200 mg total) by mouth 2 (two) times daily. (Patient taking differently: Take 100 mg by mouth once. ) 60 tablet 3  . losartan (COZAAR) 100 MG tablet Take 100 mg by mouth daily.    . multivitamin-lutein (OCUVITE-LUTEIN) CAPS capsule Take 1 capsule by mouth daily.    Vladimir Faster Glycol-Propyl Glycol (SYSTANE) 0.4-0.3 % SOLN Apply 1 drop to eye 3 (three) times daily as needed (dry eyes).     . rivaroxaban (XARELTO) 20 MG TABS tablet Take 20 mg by mouth daily with supper.    . triamcinolone cream (KENALOG) 0.1 % Apply 1 application topically 2 (two) times daily as needed (dermatitis).      No current facility-administered medications for this visit.     REVIEW OF SYSTEMS:   Constitutional: Denies fevers, chills or abnormal night sweats Eyes: Denies blurriness of vision, double vision or watery eyes Ears, nose, mouth, throat, and face: Denies mucositis or sore throat Respiratory: Chronic shortness of breath Cardiovascular: Denies palpitation, chest discomfort or lower extremity swelling Gastrointestinal:  Denies nausea, heartburn or change in bowel habits Skin: Denies abnormal skin rashes Lymphatics: Denies new lymphadenopathy or easy bruising Neurological:Denies numbness, tingling or new weaknesses Behavioral/Psych: Mood is stable, no new changes  All other systems were reviewed with the patient and are negative.  PHYSICAL EXAMINATION: ECOG PERFORMANCE STATUS: 1 - Symptomatic but completely ambulatory  Vitals:   07/14/19 1100   BP: (!) 152/67  Pulse: 81  Resp: 17  Temp: 98.5 F (36.9 C)  SpO2: 100%   Filed Weights   07/14/19 1100  Weight: 230 lb 12.8 oz (104.7 kg)    GENERAL:alert, no distress and comfortable SKIN: skin color, texture, turgor are normal, no rashes or significant lesions EYES: normal, conjunctiva are pink and non-injected, sclera clear OROPHARYNX:no exudate, no erythema and lips, buccal mucosa, and tongue normal  NECK: supple, thyroid normal size, non-tender, without nodularity LYMPH:  no palpable lymphadenopathy in the cervical, axillary or inguinal LUNGS:  clear to auscultation and percussion with normal breathing effort HEART: regular rate & rhythm and no murmurs and no lower extremity edema ABDOMEN:abdomen soft, non-tender and normal bowel sounds Musculoskeletal:no cyanosis of digits and no clubbing  PSYCH: alert & oriented x 3 with fluent speech NEURO: no focal motor/sensory deficits  LABORATORY DATA:  I have reviewed the data as listed Lab Results  Component Value Date   WBC 7.5 04/20/2019   HGB 11.6 (L) 04/20/2019   HCT 37.6 04/20/2019   MCV 88.7 04/20/2019   PLT 221 04/20/2019   Lab Results  Component Value Date   NA 140 04/20/2019   K 3.6 04/20/2019   CL 106 04/20/2019   CO2 25 04/20/2019    RADIOGRAPHIC STUDIES: I have personally reviewed the radiological reports and agreed with the findings in the report.  ASSESSMENT AND PLAN:  Bilateral pulmonary embolism ([place]) 12/03/2018: Positive for acute pulmonary embolism with right heart strain 12/04/2018: DVT of right popliteal vein and right gastrocnemius vein Treatment: Xarelto since February 2020 CT chest angiogram 04/20/2019: Near total resolution of previous bilateral PE no evidence of persistent or recurrent PE  Reason for consultation is regarding the duration of anticoagulation. I would like to obtain antiphospholipid antibody panel.  If the panel is negative then she can discontinue anticoagulation.  If the  antiphospholipid antibodies are positive then she will need to remain on blood thinners for life.  12/16/2018: MRI of the abdomen: 2.7 cm cystic lesion inferior pole of the right kidney concerning for cystic renal neoplasm, 2.2 cm left renal lesion: Under the care of urology  I will call her next week with the results of this test with a telephone visit. All questions were answered. The patient knows to call the clinic with any problems, questions or concerns.   Rulon Eisenmenger, MD 07/14/2019    I, Molly Dorshimer, am acting as scribe for Nicholas Lose, MD.  I have reviewed the above documentation for accuracy and completeness, and I agree with the above.

## 2019-07-14 ENCOUNTER — Other Ambulatory Visit: Payer: Self-pay

## 2019-07-14 ENCOUNTER — Inpatient Hospital Stay: Payer: Medicare Other

## 2019-07-14 ENCOUNTER — Inpatient Hospital Stay: Payer: Medicare Other | Attending: Hematology and Oncology | Admitting: Hematology and Oncology

## 2019-07-14 DIAGNOSIS — Z7901 Long term (current) use of anticoagulants: Secondary | ICD-10-CM | POA: Insufficient documentation

## 2019-07-14 DIAGNOSIS — I2699 Other pulmonary embolism without acute cor pulmonale: Secondary | ICD-10-CM

## 2019-07-14 DIAGNOSIS — I82431 Acute embolism and thrombosis of right popliteal vein: Secondary | ICD-10-CM | POA: Insufficient documentation

## 2019-07-14 NOTE — Assessment & Plan Note (Addendum)
12/03/2018: Positive for acute pulmonary embolism with right heart strain 12/04/2018: DVT of right popliteal vein and right gastrocnemius vein Treatment: Xarelto since February 2020  Reason for consultation is regarding the duration of anticoagulation. Check ultrasound of the lower extremity.  If ultrasound is negative for DVT then she can discontinue anticoagulation.  12/16/2018: MRI of the abdomen: 2.7 cm cystic lesion inferior pole of the right kidney concerning for cystic renal neoplasm, 2.2 cm left renal lesion: Under the care of urology

## 2019-07-15 ENCOUNTER — Telehealth: Payer: Self-pay | Admitting: Hematology and Oncology

## 2019-07-15 LAB — CARDIOLIPIN ANTIBODIES, IGG, IGM, IGA
Anticardiolipin IgA: <9 APL U/mL (ref 0–11)
Anticardiolipin IgG: <9 GPL U/mL (ref 0–14)
Anticardiolipin IgM: <9 MPL U/mL (ref 0–12)

## 2019-07-15 LAB — BETA-2-GLYCOPROTEIN I ABS, IGG/M/A
Beta-2 Glyco I IgG: <9 GPI IgG units (ref 0–20)
Beta-2-Glycoprotein I IgA: <9 GPI IgA units (ref 0–25)
Beta-2-Glycoprotein I IgM: <9 GPI IgM units (ref 0–32)

## 2019-07-15 NOTE — Telephone Encounter (Signed)
I talk with patient regarding 10/6

## 2019-07-16 LAB — LUPUS ANTICOAGULANT PANEL
DRVVT: 77.8 s — ABNORMAL HIGH (ref 0.0–47.0)
PTT Lupus Anticoagulant: 59.6 s — ABNORMAL HIGH (ref 0.0–51.9)

## 2019-07-16 LAB — PTT-LA MIX: PTT-LA Mix: 52.9 s — ABNORMAL HIGH (ref 0.0–48.9)

## 2019-07-16 LAB — DRVVT CONFIRM: dRVVT Confirm: 1.5 ratio — ABNORMAL HIGH (ref 0.8–1.2)

## 2019-07-16 LAB — DRVVT MIX: dRVVT Mix: 51.6 s — ABNORMAL HIGH (ref 0.0–47.0)

## 2019-07-16 LAB — HEXAGONAL PHASE PHOSPHOLIPID: Hexagonal Phase Phospholipid: 12 s — ABNORMAL HIGH (ref 0–11)

## 2019-07-16 IMAGING — CR DG CHEST 2V
2 series · 2 of 2 positions shown · non-contrast
Comparison: 06/02/2012

CLINICAL DATA: Shortness of breath.

EXAM:
CHEST - 2 VIEW

[w chest lat]
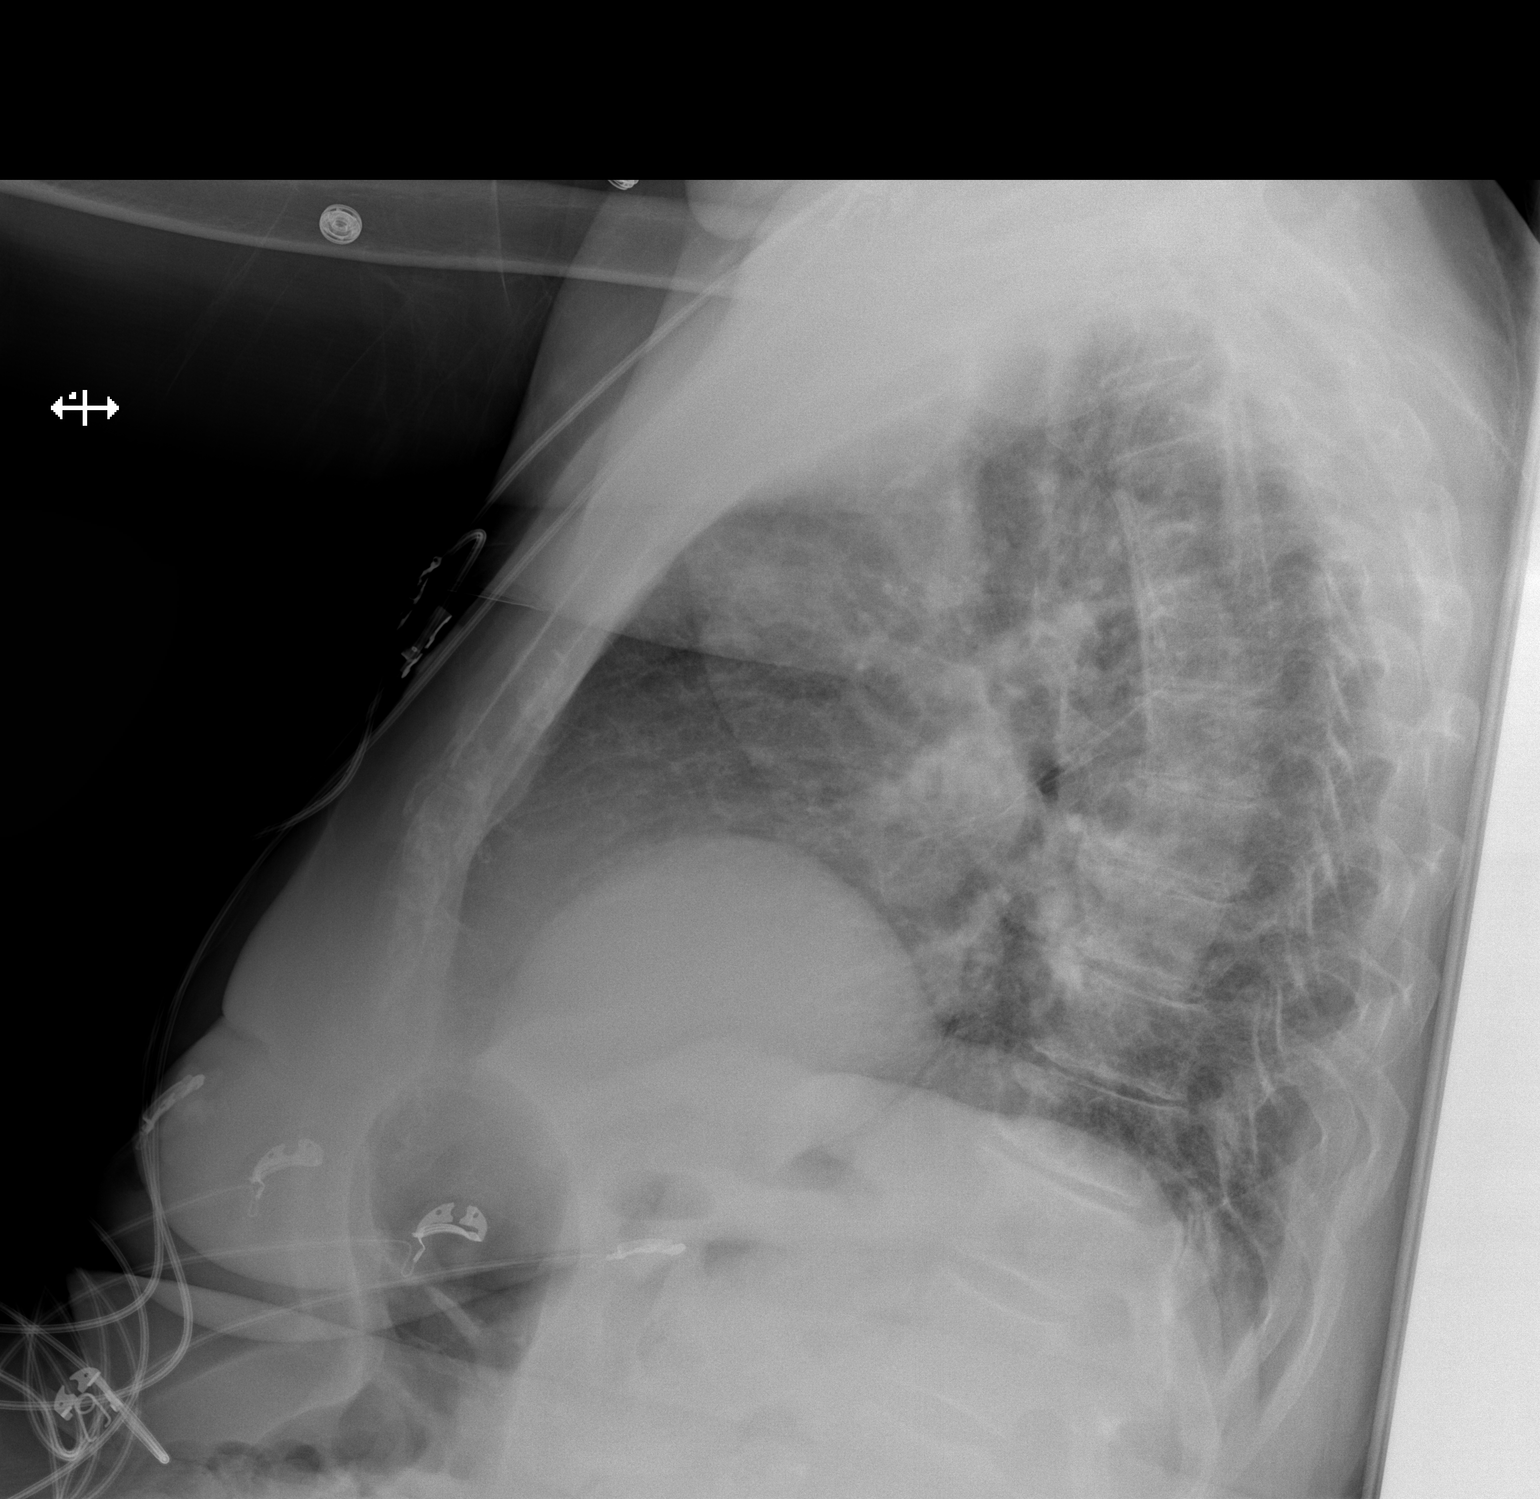

[x chest ap]
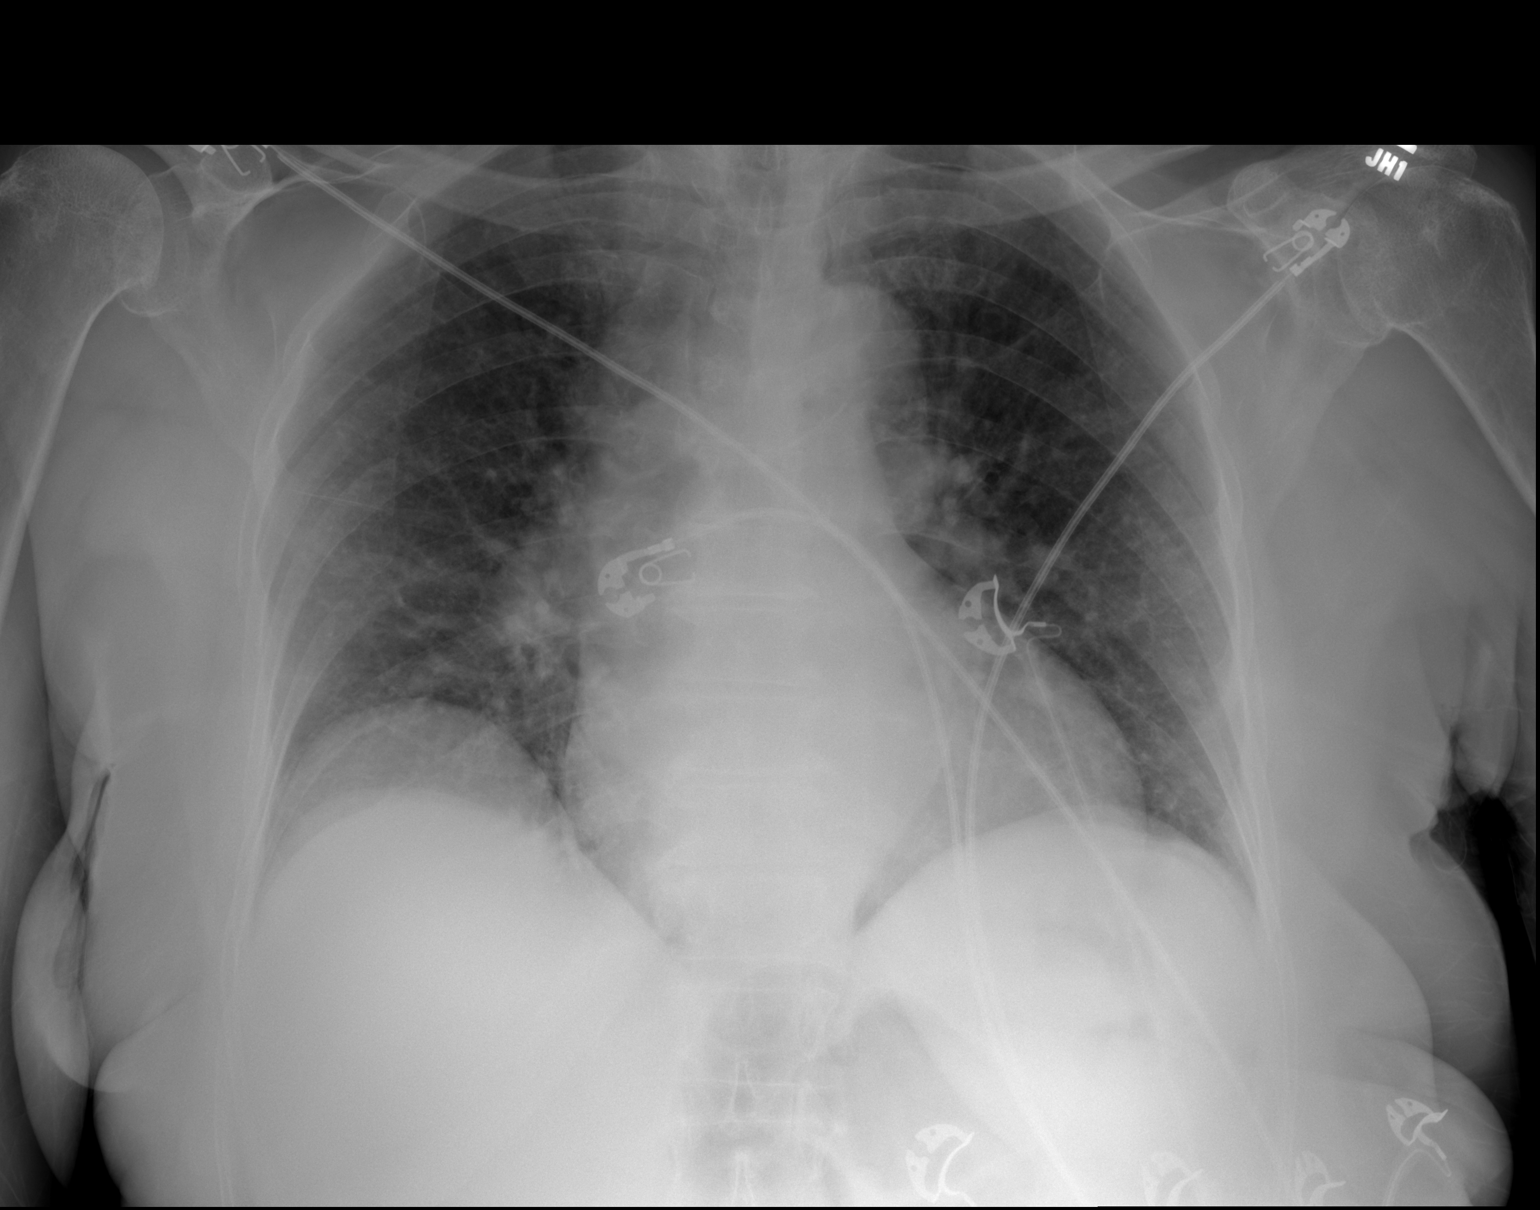

[2 of 2 positions shown; findings below may reference images not displayed]

FINDINGS: Enlarged cardiac silhouette. Mediastinal contours appear intact.

There is no evidence of focal airspace consolidation, pleural
effusion or pneumothorax. Pulmonary vascular congestion.

Osseous structures are without acute abnormality. Soft tissues are
grossly normal.
IMPRESSION: Enlarged cardiac silhouette with pulmonary vascular congestion.

## 2019-07-19 ENCOUNTER — Telehealth: Payer: Self-pay | Admitting: Hematology and Oncology

## 2019-07-19 NOTE — Progress Notes (Signed)
TELEPHONE VISIT: Verified identity using 2 different identifiers.  Patient Care Team: Merrilee Seashore, MD as PCP - General (Internal Medicine)  DIAGNOSIS:    ICD-10-CM   1. Bilateral pulmonary embolism ([place])  I26.99     CHIEF COMPLIANT: History of PE and DVT on Xarelto  INTERVAL HISTORY: Kaitlin Arnold is a 83 y.o. with above-mentioned history of PE and DVT currently on anticoagulation with Xarelto. Work-up on 07/14/19 showed: beta-2 glycoprotein negative, cardiolipin antibodies negative, lupus anticoagulant positive, PTT-LA 52.9, hexagonal phase phospholipid 12, dRVVT 1.5. She presents to the clinic today for follow-up.  She is tolerating Xarelto fairly well.  Her biggest concern is the cost of Xarelto.  REVIEW OF SYSTEMS:   Constitutional: Denies fevers, chills or abnormal weight loss Eyes: Denies blurriness of vision Ears, nose, mouth, throat, and face: Denies mucositis or sore throat Respiratory: Denies cough, dyspnea or wheezes Cardiovascular: Denies palpitation, chest discomfort Gastrointestinal: Denies nausea, heartburn or change in bowel habits Skin: Denies abnormal skin rashes Lymphatics: Denies new lymphadenopathy or easy bruising Neurological: Denies numbness, tingling or new weaknesses Behavioral/Psych: Mood is stable, no new changes  Extremities: No lower extremity edema Breast: denies any pain or lumps or nodules in either breasts All other systems were reviewed with the patient and are negative.  I have reviewed the past medical history, past surgical history, social history and family history with the patient and they are unchanged from previous note.  ALLERGIES:  has No Known Allergies.  MEDICATIONS:  Current Outpatient Medications  Medication Sig Dispense Refill  . acetaminophen (TYLENOL) 325 MG tablet Take 650 mg by mouth every 6 (six) hours as needed for moderate pain.     Marland Kitchen amLODipine (NORVASC) 10 MG tablet Take 1 tablet (10 mg total) by mouth every  morning. 90 tablet 1  . atorvastatin (LIPITOR) 20 MG tablet Take 20 mg by mouth every evening.    . cholecalciferol (VITAMIN D3) 25 MCG (1000 UT) tablet Take 1,000 Units by mouth daily.    . COMBIGAN 0.2-0.5 % ophthalmic solution Place 1 drop into both eyes every 12 (twelve) hours.     . hydrochlorothiazide (HYDRODIURIL) 25 MG tablet Take 25 mg by mouth daily.    Marland Kitchen labetalol (NORMODYNE) 200 MG tablet Take 1 tablet (200 mg total) by mouth 2 (two) times daily. (Patient taking differently: Take 100 mg by mouth once. ) 60 tablet 3  . losartan (COZAAR) 100 MG tablet Take 100 mg by mouth daily.    . multivitamin-lutein (OCUVITE-LUTEIN) CAPS capsule Take 1 capsule by mouth daily.    Vladimir Faster Glycol-Propyl Glycol (SYSTANE) 0.4-0.3 % SOLN Apply 1 drop to eye 3 (three) times daily as needed (dry eyes).     . rivaroxaban (XARELTO) 20 MG TABS tablet Take 20 mg by mouth daily with supper.    . triamcinolone cream (KENALOG) 0.1 % Apply 1 application topically 2 (two) times daily as needed (dermatitis).      No current facility-administered medications for this visit.     PHYSICAL EXAMINATION: ECOG PERFORMANCE STATUS: 1 - Symptomatic but completely ambulatory Physical exam not done because of telephone call visit  LABORATORY DATA:  I have reviewed the data as listed CMP Latest Ref Rng & Units 04/20/2019 12/04/2018 12/03/2018  Glucose 70 - 99 mg/dL 96 104(H) 109(H)  BUN 8 - 23 mg/dL 14 16 16   Creatinine 0.44 - 1.00 mg/dL 0.86 0.78 0.80  Sodium 135 - 145 mmol/L 140 139 139  Potassium 3.5 - 5.1 mmol/L 3.6 3.3(L)  3.6  Chloride 98 - 111 mmol/L 106 107 105  CO2 22 - 32 mmol/L 25 24 26   Calcium 8.9 - 10.3 mg/dL 9.2 9.3 9.4  Total Protein 6.5 - 8.1 g/dL - - 7.2  Total Bilirubin 0.3 - 1.2 mg/dL - - 0.6  Alkaline Phos 38 - 126 U/L - - 98  AST 15 - 41 U/L - - 14(L)  ALT 0 - 44 U/L - - 17    Lab Results  Component Value Date   WBC 7.5 04/20/2019   HGB 11.6 (L) 04/20/2019   HCT 37.6 04/20/2019   MCV  88.7 04/20/2019   PLT 221 04/20/2019   NEUTROABS 4.0 04/20/2019    ASSESSMENT & PLAN:  Bilateral pulmonary embolism ([place]) 12/03/2018: Positive for acute pulmonary embolism with right heart strain 12/04/2018: DVT of right popliteal vein and right gastrocnemius vein Treatment: Xarelto since February 2020 CT chest angiogram 04/20/2019: Near total resolution of previous bilateral PE no evidence of persistent or recurrent PE  Lupus anticoagulant testing 07/14/2019: Positive  Recommendation: Repeat testing for lupus anticoagulant in 3 months. If that test is positive then she will need to remain on blood thinners for life (unless there are contraindications).  No orders of the defined types were placed in this encounter.  The patient has a good understanding of the overall plan. she agrees with it. she will call with any problems that may develop before the next visit here.  Nicholas Lose, MD 07/20/2019  Julious Oka Dorshimer am acting as scribe for Dr. Nicholas Lose.  I have reviewed the above documentation for accuracy and completeness, and I agree with the above.

## 2019-07-19 NOTE — Telephone Encounter (Signed)
Contacted patient to verify telephone visit for pre reg °

## 2019-07-20 ENCOUNTER — Inpatient Hospital Stay: Payer: Medicare Other | Attending: Hematology and Oncology | Admitting: Hematology and Oncology

## 2019-07-20 DIAGNOSIS — D68312 Antiphospholipid antibody with hemorrhagic disorder: Secondary | ICD-10-CM

## 2019-07-20 DIAGNOSIS — I2699 Other pulmonary embolism without acute cor pulmonale: Secondary | ICD-10-CM | POA: Diagnosis not present

## 2019-07-20 MED ORDER — RIVAROXABAN 20 MG PO TABS: 20.0000 mg | ORAL_TABLET | Freq: Every day | ORAL | 11 refills | Status: DC

## 2019-07-20 NOTE — Assessment & Plan Note (Signed)
12/03/2018: Positive for acute pulmonary embolism with right heart strain 12/04/2018: DVT of right popliteal vein and right gastrocnemius vein Treatment: Xarelto since February 2020 CT chest angiogram 04/20/2019: Near total resolution of previous bilateral PE no evidence of persistent or recurrent PE  Lupus anticoagulant testing 07/14/2019: Positive  Recommendation: Repeat testing for lupus anticoagulant in 3 months. If that test is positive then she will need to remain on blood thinners for life (unless there are contraindications).

## 2019-07-22 ENCOUNTER — Telehealth: Payer: Self-pay | Admitting: Hematology and Oncology

## 2019-07-22 NOTE — Telephone Encounter (Signed)
I talk with patient regarding schedule  

## 2019-07-28 DIAGNOSIS — H353131 Nonexudative age-related macular degeneration, bilateral, early dry stage: Secondary | ICD-10-CM | POA: Diagnosis not present

## 2019-07-28 DIAGNOSIS — H11153 Pinguecula, bilateral: Secondary | ICD-10-CM | POA: Diagnosis not present

## 2019-07-28 DIAGNOSIS — E119 Type 2 diabetes mellitus without complications: Secondary | ICD-10-CM | POA: Diagnosis not present

## 2019-07-28 DIAGNOSIS — H16223 Keratoconjunctivitis sicca, not specified as Sjogren's, bilateral: Secondary | ICD-10-CM | POA: Diagnosis not present

## 2019-07-28 DIAGNOSIS — H401122 Primary open-angle glaucoma, left eye, moderate stage: Secondary | ICD-10-CM | POA: Diagnosis not present

## 2019-07-28 DIAGNOSIS — H0102A Squamous blepharitis right eye, upper and lower eyelids: Secondary | ICD-10-CM | POA: Diagnosis not present

## 2019-07-28 DIAGNOSIS — H18413 Arcus senilis, bilateral: Secondary | ICD-10-CM | POA: Diagnosis not present

## 2019-07-28 DIAGNOSIS — H35033 Hypertensive retinopathy, bilateral: Secondary | ICD-10-CM | POA: Diagnosis not present

## 2019-07-28 DIAGNOSIS — H5789 Other specified disorders of eye and adnexa: Secondary | ICD-10-CM | POA: Diagnosis not present

## 2019-07-28 DIAGNOSIS — H401111 Primary open-angle glaucoma, right eye, mild stage: Secondary | ICD-10-CM | POA: Diagnosis not present

## 2019-07-28 DIAGNOSIS — H0102B Squamous blepharitis left eye, upper and lower eyelids: Secondary | ICD-10-CM | POA: Diagnosis not present

## 2019-08-05 DIAGNOSIS — R3915 Urgency of urination: Secondary | ICD-10-CM | POA: Diagnosis not present

## 2019-10-19 DIAGNOSIS — N182 Chronic kidney disease, stage 2 (mild): Secondary | ICD-10-CM | POA: Diagnosis not present

## 2019-10-19 DIAGNOSIS — I10 Essential (primary) hypertension: Secondary | ICD-10-CM | POA: Diagnosis not present

## 2019-10-19 DIAGNOSIS — E782 Mixed hyperlipidemia: Secondary | ICD-10-CM | POA: Diagnosis not present

## 2019-10-19 DIAGNOSIS — G609 Hereditary and idiopathic neuropathy, unspecified: Secondary | ICD-10-CM | POA: Diagnosis not present

## 2019-10-19 NOTE — Progress Notes (Signed)
Patient Care Team: Merrilee Seashore, MD as PCP - General (Internal Medicine)  DIAGNOSIS:    ICD-10-CM   1. Acute deep vein thrombosis (DVT) of right popliteal vein ([facility])  I82.431   2. Bilateral pulmonary embolism ([facility])  I26.99     CHIEF COMPLIANT: Follow-up of PE and DVT on Xarelto  INTERVAL HISTORY: Kaitlin Arnold is a 84 y.o. with above-mentioned history of PE and DVT currently on anticoagulation with Xarelto. She presents to the clinic today for follow-up.  ALLERGIES:  has No Known Allergies.  MEDICATIONS:  Current Outpatient Medications  Medication Sig Dispense Refill  . acetaminophen (TYLENOL) 325 MG tablet Take 650 mg by mouth every 6 (six) hours as needed for moderate pain.     Marland Kitchen amLODipine (NORVASC) 10 MG tablet Take 1 tablet (10 mg total) by mouth every morning. 90 tablet 1  . atorvastatin (LIPITOR) 20 MG tablet Take 20 mg by mouth every evening.    . cholecalciferol (VITAMIN D3) 25 MCG (1000 UT) tablet Take 1,000 Units by mouth daily.    . COMBIGAN 0.2-0.5 % ophthalmic solution Place 1 drop into both eyes every 12 (twelve) hours.     . hydrochlorothiazide (HYDRODIURIL) 25 MG tablet Take 25 mg by mouth daily.    Marland Kitchen labetalol (NORMODYNE) 200 MG tablet Take 1 tablet (200 mg total) by mouth 2 (two) times daily. (Patient taking differently: Take 100 mg by mouth once. ) 60 tablet 3  . losartan (COZAAR) 100 MG tablet Take 100 mg by mouth daily.    . multivitamin-lutein (OCUVITE-LUTEIN) CAPS capsule Take 1 capsule by mouth daily.    Vladimir Faster Glycol-Propyl Glycol (SYSTANE) 0.4-0.3 % SOLN Apply 1 drop to eye 3 (three) times daily as needed (dry eyes).     . rivaroxaban (XARELTO) 20 MG TABS tablet Take 1 tablet (20 mg total) by mouth daily with supper. 30 tablet 11  . triamcinolone cream (KENALOG) 0.1 % Apply 1 application topically 2 (two) times daily as needed (dermatitis).      No current facility-administered medications for this visit.    PHYSICAL EXAMINATION: ECOG  PERFORMANCE STATUS: 1 - Symptomatic but completely ambulatory  Vitals:   10/20/19 1333  BP: (!) 187/69  Pulse: 62  Resp: 17  Temp: 98.5 F (36.9 C)  SpO2: 99%   Filed Weights   10/20/19 1333  Weight: 231 lb (104.8 kg)    LABORATORY DATA:  I have reviewed the data as listed CMP Latest Ref Rng & Units 04/20/2019 12/04/2018 12/03/2018  Glucose 70 - 99 mg/dL 96 104(H) 109(H)  BUN 8 - 23 mg/dL 14 16 16   Creatinine 0.44 - 1.00 mg/dL 0.86 0.78 0.80  Sodium 135 - 145 mmol/L 140 139 139  Potassium 3.5 - 5.1 mmol/L 3.6 3.3(L) 3.6  Chloride 98 - 111 mmol/L 106 107 105  CO2 22 - 32 mmol/L 25 24 26   Calcium 8.9 - 10.3 mg/dL 9.2 9.3 9.4  Total Protein 6.5 - 8.1 g/dL - - 7.2  Total Bilirubin 0.3 - 1.2 mg/dL - - 0.6  Alkaline Phos 38 - 126 U/L - - 98  AST 15 - 41 U/L - - 14(L)  ALT 0 - 44 U/L - - 17    Lab Results  Component Value Date   WBC 7.5 04/20/2019   HGB 11.6 (L) 04/20/2019   HCT 37.6 04/20/2019   MCV 88.7 04/20/2019   PLT 221 04/20/2019   NEUTROABS 4.0 04/20/2019    ASSESSMENT & PLAN:  Bilateral pulmonary  embolism ([place]) 12/03/2018: Positive for acute pulmonary embolism with right heart strain 12/04/2018: DVT of right popliteal vein and right gastrocnemius vein Treatment: Xarelto since February 2020 CT chest angiogram 04/20/2019: Near total resolution of previous bilateral PE no evidence of persistent or recurrent PE  Lupus anticoagulant testing 07/14/2019: Positive  Recommendation: Repeat testing for lupus anticoagulant in 3 months.  This was performed today. If this test is positive, then she will need to remain on blood thinners for life (unless there are contraindications).  Toxicities to Xarelto: None Shortness of breath: Improved Leg swelling: Better  I will call her with the result of this test tomorrow and make the final decision on Xarelto. Depending on the decision she can be followed on an as-needed basis.  No orders of the defined types were placed in this  encounter.  The patient has a good understanding of the overall plan. she agrees with it. she will call with any problems that may develop before the next visit here.  Total time spent: 30 mins including face to face time and time spent for planning, charting and coordination of care  Nicholas Lose, MD 10/20/2019  I, Cloyde Reams Dorshimer, am acting as scribe for Dr. Nicholas Lose.  I have reviewed the above documentation for accuracy and completeness, and I agree with the above.

## 2019-10-20 ENCOUNTER — Other Ambulatory Visit: Payer: Self-pay

## 2019-10-20 ENCOUNTER — Inpatient Hospital Stay: Payer: Medicare Other

## 2019-10-20 ENCOUNTER — Inpatient Hospital Stay: Payer: Medicare Other | Attending: Hematology and Oncology | Admitting: Hematology and Oncology

## 2019-10-20 VITALS — BP 187/69 | HR 62 | Temp 98.5°F | Resp 17 | Ht 66.0 in | Wt 231.0 lb

## 2019-10-20 DIAGNOSIS — I10 Essential (primary) hypertension: Secondary | ICD-10-CM

## 2019-10-20 DIAGNOSIS — I2699 Other pulmonary embolism without acute cor pulmonale: Secondary | ICD-10-CM

## 2019-10-20 DIAGNOSIS — Z7901 Long term (current) use of anticoagulants: Secondary | ICD-10-CM | POA: Insufficient documentation

## 2019-10-20 DIAGNOSIS — I82431 Acute embolism and thrombosis of right popliteal vein: Secondary | ICD-10-CM | POA: Diagnosis not present

## 2019-10-20 DIAGNOSIS — Z86711 Personal history of pulmonary embolism: Secondary | ICD-10-CM | POA: Diagnosis not present

## 2019-10-20 DIAGNOSIS — Z86718 Personal history of other venous thrombosis and embolism: Secondary | ICD-10-CM | POA: Insufficient documentation

## 2019-10-20 DIAGNOSIS — D68312 Antiphospholipid antibody with hemorrhagic disorder: Secondary | ICD-10-CM

## 2019-10-20 MED ORDER — LABETALOL HCL 100 MG PO TABS: 100.0000 mg | ORAL_TABLET | Freq: Once | ORAL | 0 refills | Status: DC

## 2019-10-20 MED ORDER — LABETALOL HCL 200 MG PO TABS: 100.0000 mg | ORAL_TABLET | Freq: Once | ORAL | Status: DC

## 2019-10-20 NOTE — Assessment & Plan Note (Signed)
12/03/2018: Positive for acute pulmonary embolism with right heart strain 12/04/2018: DVT of right popliteal vein and right gastrocnemius vein Treatment: Xarelto since February 2020 CT chest angiogram 04/20/2019: Near total resolution of previous bilateral PE no evidence of persistent or recurrent PE  Lupus anticoagulant testing 07/14/2019: Positive  Recommendation: Repeat testing for lupus anticoagulant in 3 months.  This was performed today. If this test is positive, then she will need to remain on blood thinners for life (unless there are contraindications).  Toxicities to Xarelto: None Shortness of breath: Improved Leg swelling: Better

## 2019-10-22 ENCOUNTER — Telehealth: Payer: Self-pay | Admitting: Hematology and Oncology

## 2019-10-22 DIAGNOSIS — R3915 Urgency of urination: Secondary | ICD-10-CM | POA: Diagnosis not present

## 2019-10-22 LAB — LUPUS ANTICOAGULANT PANEL
DRVVT: 78.1 s — ABNORMAL HIGH (ref 0.0–47.0)
PTT Lupus Anticoagulant: 68 s — ABNORMAL HIGH (ref 0.0–51.9)

## 2019-10-22 LAB — HEXAGONAL PHASE PHOSPHOLIPID: Hexagonal Phase Phospholipid: 1 s (ref 0–11)

## 2019-10-22 LAB — DRVVT MIX: dRVVT Mix: 54.7 s — ABNORMAL HIGH (ref 0.0–40.4)

## 2019-10-22 LAB — PTT-LA MIX: PTT-LA Mix: 55.3 s — ABNORMAL HIGH (ref 0.0–48.9)

## 2019-10-22 LAB — DRVVT CONFIRM: dRVVT Confirm: 1.5 ratio — ABNORMAL HIGH (ref 0.8–1.2)

## 2019-10-22 NOTE — Telephone Encounter (Signed)
I informed the patient that the repeat lupus anticoagulant test was also positive. She will need to remain on blood thinners for life. Ideally the treatment should be with Coumadin but after careful consideration we decided to keep her on Xarelto as it is much better tolerated for her and that she does not need to come in for frequent visits for blood checks.

## 2019-10-26 DIAGNOSIS — E782 Mixed hyperlipidemia: Secondary | ICD-10-CM | POA: Diagnosis not present

## 2019-10-26 DIAGNOSIS — R7303 Prediabetes: Secondary | ICD-10-CM | POA: Diagnosis not present

## 2019-10-26 DIAGNOSIS — I10 Essential (primary) hypertension: Secondary | ICD-10-CM | POA: Diagnosis not present

## 2019-10-26 DIAGNOSIS — I2699 Other pulmonary embolism without acute cor pulmonale: Secondary | ICD-10-CM | POA: Diagnosis not present

## 2019-10-26 DIAGNOSIS — R760 Raised antibody titer: Secondary | ICD-10-CM | POA: Diagnosis not present

## 2019-11-07 ENCOUNTER — Ambulatory Visit: Payer: Self-pay

## 2019-11-12 ENCOUNTER — Ambulatory Visit: Payer: Medicare Other

## 2019-11-17 DIAGNOSIS — H0102B Squamous blepharitis left eye, upper and lower eyelids: Secondary | ICD-10-CM | POA: Diagnosis not present

## 2019-11-17 DIAGNOSIS — H0102A Squamous blepharitis right eye, upper and lower eyelids: Secondary | ICD-10-CM | POA: Diagnosis not present

## 2019-11-17 DIAGNOSIS — H401111 Primary open-angle glaucoma, right eye, mild stage: Secondary | ICD-10-CM | POA: Diagnosis not present

## 2019-11-17 DIAGNOSIS — H401122 Primary open-angle glaucoma, left eye, moderate stage: Secondary | ICD-10-CM | POA: Diagnosis not present

## 2019-11-17 DIAGNOSIS — H353131 Nonexudative age-related macular degeneration, bilateral, early dry stage: Secondary | ICD-10-CM | POA: Diagnosis not present

## 2019-11-17 DIAGNOSIS — H18413 Arcus senilis, bilateral: Secondary | ICD-10-CM | POA: Diagnosis not present

## 2019-11-17 DIAGNOSIS — H16223 Keratoconjunctivitis sicca, not specified as Sjogren's, bilateral: Secondary | ICD-10-CM | POA: Diagnosis not present

## 2019-11-17 DIAGNOSIS — H35033 Hypertensive retinopathy, bilateral: Secondary | ICD-10-CM | POA: Diagnosis not present

## 2019-11-17 DIAGNOSIS — E119 Type 2 diabetes mellitus without complications: Secondary | ICD-10-CM | POA: Diagnosis not present

## 2019-11-30 ENCOUNTER — Other Ambulatory Visit: Payer: Self-pay | Admitting: Cardiology

## 2019-11-30 DIAGNOSIS — I10 Essential (primary) hypertension: Secondary | ICD-10-CM

## 2019-12-03 ENCOUNTER — Ambulatory Visit: Payer: Medicare Other

## 2019-12-28 DIAGNOSIS — H401111 Primary open-angle glaucoma, right eye, mild stage: Secondary | ICD-10-CM | POA: Diagnosis not present

## 2020-01-11 DIAGNOSIS — H401122 Primary open-angle glaucoma, left eye, moderate stage: Secondary | ICD-10-CM | POA: Diagnosis not present

## 2020-02-03 DIAGNOSIS — R062 Wheezing: Secondary | ICD-10-CM | POA: Diagnosis not present

## 2020-02-03 DIAGNOSIS — J4521 Mild intermittent asthma with (acute) exacerbation: Secondary | ICD-10-CM | POA: Diagnosis not present

## 2020-02-03 DIAGNOSIS — R05 Cough: Secondary | ICD-10-CM | POA: Diagnosis not present

## 2020-02-17 DIAGNOSIS — R062 Wheezing: Secondary | ICD-10-CM | POA: Diagnosis not present

## 2020-02-17 DIAGNOSIS — R05 Cough: Secondary | ICD-10-CM | POA: Diagnosis not present

## 2020-02-17 DIAGNOSIS — R32 Unspecified urinary incontinence: Secondary | ICD-10-CM | POA: Diagnosis not present

## 2020-02-17 DIAGNOSIS — J4521 Mild intermittent asthma with (acute) exacerbation: Secondary | ICD-10-CM | POA: Diagnosis not present

## 2020-02-17 DIAGNOSIS — R0602 Shortness of breath: Secondary | ICD-10-CM | POA: Diagnosis not present

## 2020-02-22 DIAGNOSIS — H0102B Squamous blepharitis left eye, upper and lower eyelids: Secondary | ICD-10-CM | POA: Diagnosis not present

## 2020-02-22 DIAGNOSIS — Z9889 Other specified postprocedural states: Secondary | ICD-10-CM | POA: Diagnosis not present

## 2020-02-22 DIAGNOSIS — E119 Type 2 diabetes mellitus without complications: Secondary | ICD-10-CM | POA: Diagnosis not present

## 2020-02-22 DIAGNOSIS — H353131 Nonexudative age-related macular degeneration, bilateral, early dry stage: Secondary | ICD-10-CM | POA: Diagnosis not present

## 2020-02-22 DIAGNOSIS — H401122 Primary open-angle glaucoma, left eye, moderate stage: Secondary | ICD-10-CM | POA: Diagnosis not present

## 2020-02-22 DIAGNOSIS — H0102A Squamous blepharitis right eye, upper and lower eyelids: Secondary | ICD-10-CM | POA: Diagnosis not present

## 2020-02-22 DIAGNOSIS — H16223 Keratoconjunctivitis sicca, not specified as Sjogren's, bilateral: Secondary | ICD-10-CM | POA: Diagnosis not present

## 2020-02-22 DIAGNOSIS — Z961 Presence of intraocular lens: Secondary | ICD-10-CM | POA: Diagnosis not present

## 2020-02-22 DIAGNOSIS — H401111 Primary open-angle glaucoma, right eye, mild stage: Secondary | ICD-10-CM | POA: Diagnosis not present

## 2020-02-22 DIAGNOSIS — H35033 Hypertensive retinopathy, bilateral: Secondary | ICD-10-CM | POA: Diagnosis not present

## 2020-03-21 DIAGNOSIS — I2699 Other pulmonary embolism without acute cor pulmonale: Secondary | ICD-10-CM | POA: Diagnosis not present

## 2020-03-21 DIAGNOSIS — I10 Essential (primary) hypertension: Secondary | ICD-10-CM | POA: Diagnosis not present

## 2020-03-21 DIAGNOSIS — R7303 Prediabetes: Secondary | ICD-10-CM | POA: Diagnosis not present

## 2020-03-21 DIAGNOSIS — E782 Mixed hyperlipidemia: Secondary | ICD-10-CM | POA: Diagnosis not present

## 2020-04-05 ENCOUNTER — Ambulatory Visit (INDEPENDENT_AMBULATORY_CARE_PROVIDER_SITE_OTHER): Payer: Medicare Other | Admitting: Emergency Medicine

## 2020-04-05 ENCOUNTER — Encounter: Payer: Self-pay | Admitting: Emergency Medicine

## 2020-04-05 ENCOUNTER — Other Ambulatory Visit: Payer: Self-pay

## 2020-04-05 ENCOUNTER — Ambulatory Visit: Payer: Medicare Other

## 2020-04-05 VITALS — BP 138/70 | HR 78 | Temp 98.1°F | Ht 65.0 in | Wt 221.0 lb

## 2020-04-05 DIAGNOSIS — I10 Essential (primary) hypertension: Secondary | ICD-10-CM

## 2020-04-05 DIAGNOSIS — I2699 Other pulmonary embolism without acute cor pulmonale: Secondary | ICD-10-CM

## 2020-04-05 DIAGNOSIS — R0602 Shortness of breath: Secondary | ICD-10-CM

## 2020-04-05 NOTE — Assessment & Plan Note (Signed)
Multifactorial.  She has a history of pulmonary embolism and lower extremity DVT in 11/2018 treated with Xarelto.  She was found to have a positive lupus anticoagulant and the plan was for her to stay on DOAC lifelong if possible.  She had evidence for pulmonary hypertension on her echocardiogram at the time of that diagnosis but repeat in June 2020 showed improvement in her RV size and function.  She also has a history of tobacco use, is at risk for COPD. She needs pulmonary function testing to evaluate for obstruction.  Stop Anoro for now although ultimately she may need to be on schedule bronchodilator therapy.  We will repeat her echocardiogram to ensure no increase in her pulmonary pressures.  Also perform a V/Q scan to evaluate for chronic thromboembolic disease, possible although unlikely that she could have breakthrough VTE even on the Xarelto. Walking oximetry today to r/o occult desats.

## 2020-04-05 NOTE — Progress Notes (Signed)
Subjective:    Patient ID: Kaitlin Arnold, female    DOB: 1936-08-31, 84 y.o.   MRN: [id number]   HPI 84 year old former smoker (20 pack years) with a history of pulmonary embolism and lower extremity DVT in 11/2018 treated with Xarelto, positive lupus anticoagulant.  Also with hypertension, CAD, hyperlipidemia, occasional GERD.    She began to notice exertional SOB over the last 6 months, noticed with walking and housework. Better after resting. No associated wheeze that she has noticed but family has heard before. She does have daily nasal congestion, cough at night. No-productive. No CP, but describes a heaviness. Relieved by 5 minutes of rest. She gained about 10 lbs over the last year.   She is on Anoro since last month to see if she would benefit >> she reports that she has been using prn  Echocardiogram 03/18/2019 reviewed by me, showed intact LV function, grade 1 diastolic dysfunction, no evidence of pulmonary hypertension, intact RV size and function.   Review of Systems As per HPI  Past Medical History:  Diagnosis Date  . Aortic atherosclerosis ([place]) 12/04/2018  . Arthritis    oa and pain left knee;  previous right total knee replacement  1998  . CAD (coronary artery disease) 12/04/2018  . Chest pain 12/18/2018  . GERD (gastroesophageal reflux disease)    seldom  . H/O cardiovascular stress test 05/27/12   STRESS TEST WAS DONE for cardiac clearance for left total knee replacement--given clearance by dr. Einar Gip.   EF 71%, NO ISCHEMIA  . Hyperlipidemia   . Hypertension   . Shortness of breath    with exertion  . SOB (shortness of breath) 12/18/2018     Family History  Problem Relation Age of Onset  . Diabetes Father   . Heart attack Sister   . Colon cancer Sister   . Colon cancer Sister      Social History   Socioeconomic History  . Marital status: Widowed    Spouse name: Not on file  . Number of children: 3  . Years of education: Not on file  . Highest education  level: Not on file  Occupational History  . Not on file  Tobacco Use  . Smoking status: Former Smoker    Packs/day: 0.50    Years: 40.00    Pack years: 20.00    Types: Cigarettes    Quit date: 12/04/2018    Years since quitting: 1.3  . Smokeless tobacco: Never Used  . Tobacco comment: not everyday smoker--if smoking 4 to 5 cigs a day  Vaping Use  . Vaping Use: Never used  Substance and Sexual Activity  . Alcohol use: Yes    Comment: very seldom  . Drug use: No  . Sexual activity: Not on file  Other Topics Concern  . Not on file  Social History Narrative  . Not on file   Social Determinants of Health   Financial Resource Strain:   . Difficulty of Paying Living Expenses:   Food Insecurity:   . Worried About Charity fundraiser in the Last Year:   . Arboriculturist in the Last Year:   Transportation Needs:   . Film/video editor (Medical):   Marland Kitchen Lack of Transportation (Non-Medical):   Physical Activity:   . Days of Exercise per Week:   . Minutes of Exercise per Session:   Stress:   . Feeling of Stress :   Social Connections:   . Frequency of Communication  with Friends and Family:   . Frequency of Social Gatherings with Friends and Family:   . Attends Religious Services:   . Active Member of Clubs or Organizations:   . Attends Archivist Meetings:   Marland Kitchen Marital Status:   Intimate Partner Violence:   . Fear of Current or Ex-Partner:   . Emotionally Abused:   Marland Kitchen Physically Abused:   . 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   No Known Allergies   Outpatient Medications Prior to Visit  Medication Sig Dispense Refill  . acetaminophen (TYLENOL) 325 MG tablet Take 650 mg by mouth every 6 (six) hours as needed for moderate pain.     Marland Kitchen ALPRAZolam (XANAX) 0.25 MG tablet Take 0.25 mg by mouth at bedtime as needed for anxiety. 1 tab prior to MRI    . ANORO ELLIPTA 62.5-25 MCG/INH AEPB 1 puff daily.    Marland Kitchen atorvastatin  (LIPITOR) 20 MG tablet Take 20 mg by mouth every evening.    . brinzolamide (AZOPT) 1 % ophthalmic suspension     . cholecalciferol (VITAMIN D3) 25 MCG (1000 UT) tablet Take 1,000 Units by mouth daily.    . COMBIGAN 0.2-0.5 % ophthalmic solution Place 1 drop into both eyes every 12 (twelve) hours.     . hydrochlorothiazide (HYDRODIURIL) 25 MG tablet Take 25 mg by mouth daily.    Marland Kitchen ipratropium (ATROVENT) 0.06 % nasal spray Place 2 sprays into both nostrils in the morning and at bedtime.    Marland Kitchen labetalol (NORMODYNE) 100 MG tablet Take 1 tablet (100 mg total) by mouth once for 1 dose. 0.5 tablet 0  . labetalol (NORMODYNE) 200 MG tablet Take by mouth.    . losartan (COZAAR) 100 MG tablet Take 100 mg by mouth daily.    . multivitamin-lutein (OCUVITE-LUTEIN) CAPS capsule Take 1 capsule by mouth daily.    Marland Kitchen MYRBETRIQ 50 MG TB24 tablet Take 50 mg by mouth daily.    Vladimir Faster Glycol-Propyl Glycol (SYSTANE) 0.4-0.3 % SOLN Apply 1 drop to eye 3 (three) times daily as needed (dry eyes).     . rivaroxaban (XARELTO) 20 MG TABS tablet Take 1 tablet (20 mg total) by mouth daily with supper. 30 tablet 11  . SIMBRINZA 1-0.2 % SUSP INSTILL 1 DROP INTO EACH EYE TWICE DAILY    . triamcinolone cream (KENALOG) 0.1 % Apply 1 application topically 2 (two) times daily as needed (dermatitis).     Marland Kitchen amLODipine (NORVASC) 10 MG tablet TAKE 1 TABLET BY MOUTH ONCE DAILY IN THE MORNING (Patient not taking: Reported on 04/05/2020) 90 tablet 0   No facility-administered medications prior to visit.        Objective:   Physical Exam Vitals:   04/05/20 0945  BP: 138/70  Pulse: 78  Temp: 98.1 F (36.7 C)  TempSrc: Oral  SpO2: 98%  Weight: 221 lb (100.2 kg)  Height: 5\' 5"  (1.651 m)   Gen: Pleasant, well-nourished, in no distress,  normal affect  ENT: No lesions,  mouth clear,  oropharynx clear, no postnasal drip  Neck: No JVD, no stridor  Lungs: No use of accessory muscles, distant, mild end expiratory wheeze on  forced expiration  Cardiovascular: RRR, heart sounds normal, no murmur or gallops, trace ankle peripheral edema  Musculoskeletal: No deformities, no cyanosis or clubbing  Neuro: alert, awake, non focal  Skin: Warm, no lesions or rash     Assessment & Plan:  SOB (shortness of breath) Multifactorial.  She has a history of pulmonary embolism and lower extremity DVT in 11/2018 treated with Xarelto.  She was found to have a positive lupus anticoagulant and the plan was for her to stay on DOAC lifelong if possible.  She had evidence for pulmonary hypertension on her echocardiogram at the time of that diagnosis but repeat in June 2020 showed improvement in her RV size and function.  She also has a history of tobacco use, is at risk for COPD. She needs pulmonary function testing to evaluate for obstruction.  Stop Anoro for now although ultimately she may need to be on schedule bronchodilator therapy.  We will repeat her echocardiogram to ensure no increase in her pulmonary pressures.  Also perform a V/Q scan to evaluate for chronic thromboembolic disease, possible although unlikely that she could have breakthrough VTE even on the Xarelto. Walking oximetry today to r/o occult desats.     Baltazar Apo, MD, PhD 04/05/2020, 5:21 PM [facility] [phone] or if no answer [phone]

## 2020-04-05 NOTE — Patient Instructions (Signed)
We will repeat your echocardiogram to compare with last year We will perform a ventilation perfusion scan Please continue Xarelto as you have been taking it Stop Anoro for now We will perform full pulmonary function testing in next office visit Walking oximetry today on room air Follow with Dr. Lamonte Sakai next available with full pulmonary function testing on the same day.

## 2020-04-12 ENCOUNTER — Encounter (HOSPITAL_COMMUNITY)
Admission: RE | Admit: 2020-04-12 | Discharge: 2020-04-12 | Disposition: A | Discharge status: Home / Self Care | Payer: Medicare Other | Source: Ambulatory Visit | Attending: Emergency Medicine | Admitting: Emergency Medicine

## 2020-04-12 ENCOUNTER — Other Ambulatory Visit: Payer: Self-pay | Admitting: Emergency Medicine

## 2020-04-12 ENCOUNTER — Ambulatory Visit (HOSPITAL_COMMUNITY)
Admission: RE | Admit: 2020-04-12 | Discharge: 2020-04-12 | Disposition: A | Discharge status: Home / Self Care | Payer: Medicare Other | Source: Ambulatory Visit | Attending: Emergency Medicine | Admitting: Emergency Medicine

## 2020-04-12 ENCOUNTER — Other Ambulatory Visit: Payer: Self-pay

## 2020-04-12 DIAGNOSIS — I2699 Other pulmonary embolism without acute cor pulmonale: Secondary | ICD-10-CM | POA: Diagnosis not present

## 2020-04-12 MED ORDER — TECHNETIUM TO 99M ALBUMIN AGGREGATED
4.3000 | Freq: Once | INTRAVENOUS | Status: AC | PRN
  Administered 2020-04-12: 4.3 via INTRAVENOUS

## 2020-04-26 DIAGNOSIS — R600 Localized edema: Secondary | ICD-10-CM | POA: Diagnosis not present

## 2020-04-26 DIAGNOSIS — Z6834 Body mass index (BMI) 34.0-34.9, adult: Secondary | ICD-10-CM | POA: Diagnosis not present

## 2020-04-26 DIAGNOSIS — R351 Nocturia: Secondary | ICD-10-CM | POA: Diagnosis not present

## 2020-04-26 DIAGNOSIS — E6609 Other obesity due to excess calories: Secondary | ICD-10-CM | POA: Diagnosis not present

## 2020-04-26 DIAGNOSIS — N950 Postmenopausal bleeding: Secondary | ICD-10-CM | POA: Diagnosis not present

## 2020-04-26 DIAGNOSIS — R339 Retention of urine, unspecified: Secondary | ICD-10-CM | POA: Diagnosis not present

## 2020-04-28 ENCOUNTER — Ambulatory Visit (HOSPITAL_COMMUNITY): Payer: Medicare Other | Attending: Cardiovascular Disease

## 2020-04-28 ENCOUNTER — Other Ambulatory Visit: Payer: Self-pay

## 2020-04-28 DIAGNOSIS — I10 Essential (primary) hypertension: Secondary | ICD-10-CM | POA: Insufficient documentation

## 2020-04-28 LAB — ECHOCARDIOGRAM COMPLETE
Area-P 1/2: 2.66 cm2
S' Lateral: 2.7 cm

## 2020-05-22 ENCOUNTER — Ambulatory Visit (INDEPENDENT_AMBULATORY_CARE_PROVIDER_SITE_OTHER): Payer: Medicare Other | Admitting: Emergency Medicine

## 2020-05-22 ENCOUNTER — Encounter: Payer: Self-pay | Admitting: Emergency Medicine

## 2020-05-22 ENCOUNTER — Other Ambulatory Visit: Payer: Self-pay

## 2020-05-22 DIAGNOSIS — R0602 Shortness of breath: Secondary | ICD-10-CM

## 2020-05-22 DIAGNOSIS — I2699 Other pulmonary embolism without acute cor pulmonale: Secondary | ICD-10-CM

## 2020-05-22 LAB — PULMONARY FUNCTION TEST
DL/VA % pred: 114 %
DL/VA: 4.61 ml/min/mmHg/L
DLCO cor % pred: 124 %
DLCO cor: 24.06 ml/min/mmHg
DLCO unc % pred: 124 %
DLCO unc: 24.06 ml/min/mmHg
FEF 25-75 Post: 1.25 L/sec
FEF 25-75 Pre: 1.26 L/sec
FEF2575-%Change-Post: 0 %
FEF2575-%Pred-Post: 95 %
FEF2575-%Pred-Pre: 96 %
FEV1-%Change-Post: 0 %
FEV1-%Pred-Post: 77 %
FEV1-%Pred-Pre: 77 %
FEV1-Post: 1.23 L
FEV1-Pre: 1.24 L
FEV1FVC-%Change-Post: 5 %
FEV1FVC-%Pred-Pre: 108 %
FEV6-%Change-Post: -5 %
FEV6-%Pred-Post: 72 %
FEV6-%Pred-Pre: 77 %
FEV6-Post: 1.44 L
FEV6-Pre: 1.52 L
FEV6FVC-%Change-Post: 0 %
FEV6FVC-%Pred-Post: 104 %
FEV6FVC-%Pred-Pre: 104 %
FVC-%Change-Post: -5 %
FVC-%Pred-Post: 69 %
FVC-%Pred-Pre: 74 %
FVC-Post: 1.44 L
FVC-Pre: 1.53 L
Post FEV1/FVC ratio: 85 %
Post FEV6/FVC ratio: 100 %
Pre FEV1/FVC ratio: 81 %
Pre FEV6/FVC Ratio: 100 %
RV % pred: 93 %
RV: 2.32 L
TLC % pred: 78 %
TLC: 4.08 L

## 2020-05-22 MED ORDER — ALBUTEROL SULFATE HFA 108 (90 BASE) MCG/ACT IN AERS
2.0000 | INHALATION_SPRAY | Freq: Four times a day (QID) | RESPIRATORY_TRACT | 6 refills | Status: DC | PRN
Start: 2020-05-22 — End: 2021-12-20

## 2020-05-22 NOTE — Assessment & Plan Note (Signed)
Pulmonary function testing consistent with mild mixed restriction and obstruction.  I think it is worthwhile to do a trial of albuterol to see if she gets benefit.  She will keep track of whether it helps her.  I do not think she needs a scheduled bronchodilator at this time.  She will benefit also from conditioning, exercise, weight loss.

## 2020-05-22 NOTE — Addendum Note (Signed)
Addended by: Gavin Potters R on: 05/22/2020 12:22 PM   Modules accepted: Orders

## 2020-05-22 NOTE — Progress Notes (Signed)
PFT done today. 

## 2020-05-22 NOTE — Patient Instructions (Signed)
We will try starting albuterol 2 puffs if you need it for shortness of breath, chest tightness, wheezing.  Keep track of how the medication helps you so that we can decide whether to continue it going forward. Continue to work on your exercise and conditioning. Continue your Xarelto as you have been taking it. Follow with Dr Lamonte Sakai in 6 months or sooner if you have any problems

## 2020-05-22 NOTE — Assessment & Plan Note (Signed)
No evidence of chronic thromboembolic disease on VQ scan.  Continue Xarelto long-term.

## 2020-05-22 NOTE — Progress Notes (Signed)
Subjective:    Patient ID: Kaitlin Arnold, female    DOB: 04/02/1936, 84 y.o.   MRN: [id number]   HPI 84 year old former smoker (20 pack years) with a history of pulmonary embolism and lower extremity DVT in 11/2018 treated with Xarelto, positive lupus anticoagulant.  Also with hypertension, CAD, hyperlipidemia, occasional GERD.    She began to notice exertional SOB over the last 6 months, noticed with walking and housework. Better after resting. No associated wheeze that she has noticed but family has heard before. She does have daily nasal congestion, cough at night. No-productive. No CP, but describes a heaviness. Relieved by 5 minutes of rest. She gained about 10 lbs over the last year.   She is on Anoro since last month to see if she would benefit >> she reports that she has been using prn  Echocardiogram 03/18/2019 reviewed by me, showed intact LV function, grade 1 diastolic dysfunction, no evidence of pulmonary hypertension, intact RV size and function.  ROV 05/22/20 --this is a follow-up visit for a former smoker with multifactorial dyspnea.  She has a history of lower extremity DVT and pulmonary emboli, positive lupus anticoagulant.  Secondary pulmonary hypertension that improved with anticoagulation.  I repeated a ventilation/perfusion scan on 04/12/2020 which was low probability, did not show any evidence of chronic thromboembolic disease.  Chest x-ray on 04/12/2020 reviewed, was normal. She is still noticing exertional SOB with walking and stairs.   Pulmonary function testing done today, reviewed by me, shows mixed obstruction and restriction on spirometry without a bronchodilator response, mild restriction on volumes (TLC 78%), normal diffusion capacity.   Review of Systems As per HPI  Past Medical History:  Diagnosis Date  . Aortic atherosclerosis ([place]) 12/04/2018  . Arthritis    oa and pain left knee;  previous right total knee replacement  1998  . CAD (coronary artery disease)  12/04/2018  . Chest pain 12/18/2018  . GERD (gastroesophageal reflux disease)    seldom  . H/O cardiovascular stress test 05/27/12   STRESS TEST WAS DONE for cardiac clearance for left total knee replacement--given clearance by dr. Einar Gip.   EF 71%, NO ISCHEMIA  . Hyperlipidemia   . Hypertension   . Shortness of breath    with exertion  . SOB (shortness of breath) 12/18/2018     Family History  Problem Relation Age of Onset  . Diabetes Father   . Heart attack Sister   . Colon cancer Sister   . Colon cancer Sister      Social History   Socioeconomic History  . Marital status: Widowed    Spouse name: Not on file  . Number of children: 3  . Years of education: Not on file  . Highest education level: Not on file  Occupational History  . Not on file  Tobacco Use  . Smoking status: Former Smoker    Packs/day: 0.50    Years: 40.00    Pack years: 20.00    Types: Cigarettes    Quit date: 12/04/2018    Years since quitting: 1.4  . Smokeless tobacco: Never Used  . Tobacco comment: not everyday smoker--if smoking 4 to 5 cigs a day  Vaping Use  . Vaping Use: Never used  Substance and Sexual Activity  . Alcohol use: Yes    Comment: very seldom  . Drug use: No  . Sexual activity: Not on file  Other Topics Concern  . Not on file  Social History Narrative  . Not  on file   Social Determinants of Health   Financial Resource Strain:   . Difficulty of Paying Living Expenses:   Food Insecurity:   . Worried About Charity fundraiser in the Last Year:   . Arboriculturist in the Last Year:   Transportation Needs:   . Film/video editor (Medical):   Marland Kitchen Lack of Transportation (Non-Medical):   Physical Activity:   . Days of Exercise per Week:   . Minutes of Exercise per Session:   Stress:   . Feeling of Stress :   Social Connections:   . Frequency of Communication with Friends and Family:   . Frequency of Social Gatherings with Friends and Family:   . Attends Religious Services:    . Active Member of Clubs or Organizations:   . Attends Archivist Meetings:   Marland Kitchen Marital Status:   Intimate Partner Violence:   . Fear of Current or Ex-Partner:   . Emotionally Abused:   Marland Kitchen Physically Abused:   . 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   No Known Allergies   Outpatient Medications Prior to Visit  Medication Sig Dispense Refill  . acetaminophen (TYLENOL) 325 MG tablet Take 650 mg by mouth every 6 (six) hours as needed for moderate pain.     Marland Kitchen amLODipine (NORVASC) 10 MG tablet TAKE 1 TABLET BY MOUTH ONCE DAILY IN THE MORNING 90 tablet 0  . atorvastatin (LIPITOR) 20 MG tablet Take 20 mg by mouth every evening.    . brinzolamide (AZOPT) 1 % ophthalmic suspension     . cholecalciferol (VITAMIN D3) 25 MCG (1000 UT) tablet Take 1,000 Units by mouth daily.    . hydrochlorothiazide (HYDRODIURIL) 25 MG tablet Take 25 mg by mouth daily.    Marland Kitchen ipratropium (ATROVENT) 0.06 % nasal spray Place 2 sprays into both nostrils in the morning and at bedtime.    Marland Kitchen labetalol (NORMODYNE) 100 MG tablet Take 1 tablet (100 mg total) by mouth once for 1 dose. 0.5 tablet 0  . losartan (COZAAR) 100 MG tablet Take 100 mg by mouth daily.    . multivitamin-lutein (OCUVITE-LUTEIN) CAPS capsule Take 1 capsule by mouth daily.    Vladimir Faster Glycol-Propyl Glycol (SYSTANE) 0.4-0.3 % SOLN Apply 1 drop to eye 3 (three) times daily as needed (dry eyes).     . rivaroxaban (XARELTO) 20 MG TABS tablet Take 1 tablet (20 mg total) by mouth daily with supper. 30 tablet 11  . SIMBRINZA 1-0.2 % SUSP INSTILL 1 DROP INTO EACH EYE TWICE DAILY    . triamcinolone cream (KENALOG) 0.1 % Apply 1 application topically 2 (two) times daily as needed (dermatitis).     Jearl Klinefelter ELLIPTA 62.5-25 MCG/INH AEPB 1 puff daily. (Patient not taking: Reported on 05/22/2020)    . ALPRAZolam (XANAX) 0.25 MG tablet Take 0.25 mg by mouth at bedtime as needed for anxiety. 1 tab prior to MRI    .  COMBIGAN 0.2-0.5 % ophthalmic solution Place 1 drop into both eyes every 12 (twelve) hours.     Marland Kitchen labetalol (NORMODYNE) 200 MG tablet Take by mouth.    Marland Kitchen MYRBETRIQ 50 MG TB24 tablet Take 50 mg by mouth daily.     No facility-administered medications prior to visit.        Objective:   Physical Exam Vitals:   05/22/20 1127  BP: 122/68  Pulse: (!) 58  Temp: 98 F (  36.7 C)  TempSrc: Oral  SpO2: 99%  Weight: 230 lb 3.2 oz (104.4 kg)  Height: 5\' 5"  (1.651 m)   Gen: Pleasant, well-nourished, in no distress,  normal affect  ENT: No lesions,  mouth clear,  oropharynx clear, no postnasal drip  Neck: No JVD, no stridor  Lungs: No use of accessory muscles, distant, mild end expiratory wheeze on forced expiration  Cardiovascular: RRR, heart sounds normal, no murmur or gallops, trace ankle peripheral edema  Musculoskeletal: No deformities, no cyanosis or clubbing  Neuro: alert, awake, non focal  Skin: Warm, no lesions or rash     Assessment & Plan:  Bilateral pulmonary embolism (HCC) No evidence of chronic thromboembolic disease on VQ scan.  Continue Xarelto long-term.  SOB (shortness of breath) Pulmonary function testing consistent with mild mixed restriction and obstruction.  I think it is worthwhile to do a trial of albuterol to see if she gets benefit.  She will keep track of whether it helps her.  I do not think she needs a scheduled bronchodilator at this time.  She will benefit also from conditioning, exercise, weight loss.  Baltazar Apo, MD, PhD 05/22/2020, 11:48 AM [facility] [phone] or if no answer [phone]

## 2020-05-23 ENCOUNTER — Other Ambulatory Visit: Payer: Self-pay

## 2020-05-23 MED ORDER — ALBUTEROL SULFATE HFA 108 (90 BASE) MCG/ACT IN AERS: 2.0000 | INHALATION_SPRAY | Freq: Four times a day (QID) | RESPIRATORY_TRACT | 5 refills | Status: DC | PRN

## 2020-05-26 DIAGNOSIS — N950 Postmenopausal bleeding: Secondary | ICD-10-CM | POA: Diagnosis not present

## 2020-05-26 DIAGNOSIS — R9389 Abnormal findings on diagnostic imaging of other specified body structures: Secondary | ICD-10-CM | POA: Diagnosis not present

## 2020-05-31 DIAGNOSIS — N841 Polyp of cervix uteri: Secondary | ICD-10-CM | POA: Diagnosis not present

## 2020-05-31 DIAGNOSIS — R351 Nocturia: Secondary | ICD-10-CM | POA: Diagnosis not present

## 2020-05-31 DIAGNOSIS — N950 Postmenopausal bleeding: Secondary | ICD-10-CM | POA: Diagnosis not present

## 2020-06-05 ENCOUNTER — Other Ambulatory Visit: Payer: Self-pay | Admitting: Cardiology

## 2020-06-05 DIAGNOSIS — I10 Essential (primary) hypertension: Secondary | ICD-10-CM

## 2020-06-05 NOTE — Telephone Encounter (Signed)
She was made PRN, Could have forwarded this to her PCP

## 2020-06-22 DIAGNOSIS — N841 Polyp of cervix uteri: Secondary | ICD-10-CM | POA: Diagnosis not present

## 2020-06-22 DIAGNOSIS — R001 Bradycardia, unspecified: Secondary | ICD-10-CM | POA: Diagnosis not present

## 2020-06-22 DIAGNOSIS — I4510 Unspecified right bundle-branch block: Secondary | ICD-10-CM | POA: Diagnosis not present

## 2020-06-22 DIAGNOSIS — N950 Postmenopausal bleeding: Secondary | ICD-10-CM | POA: Diagnosis not present

## 2020-06-26 ENCOUNTER — Other Ambulatory Visit: Payer: Self-pay | Admitting: *Deleted

## 2020-06-26 MED ORDER — RIVAROXABAN 20 MG PO TABS: 20.0000 mg | ORAL_TABLET | Freq: Every day | ORAL | 3 refills | Status: DC

## 2020-07-03 DIAGNOSIS — N950 Postmenopausal bleeding: Secondary | ICD-10-CM | POA: Diagnosis not present

## 2020-07-03 DIAGNOSIS — N856 Intrauterine synechiae: Secondary | ICD-10-CM | POA: Diagnosis not present

## 2020-07-03 DIAGNOSIS — N841 Polyp of cervix uteri: Secondary | ICD-10-CM | POA: Diagnosis not present

## 2020-07-13 DIAGNOSIS — Z23 Encounter for immunization: Secondary | ICD-10-CM | POA: Diagnosis not present

## 2020-08-07 DIAGNOSIS — G609 Hereditary and idiopathic neuropathy, unspecified: Secondary | ICD-10-CM | POA: Diagnosis not present

## 2020-08-07 DIAGNOSIS — E782 Mixed hyperlipidemia: Secondary | ICD-10-CM | POA: Diagnosis not present

## 2020-08-07 DIAGNOSIS — I10 Essential (primary) hypertension: Secondary | ICD-10-CM | POA: Diagnosis not present

## 2020-08-07 DIAGNOSIS — I2699 Other pulmonary embolism without acute cor pulmonale: Secondary | ICD-10-CM | POA: Diagnosis not present

## 2020-08-07 DIAGNOSIS — Z79899 Other long term (current) drug therapy: Secondary | ICD-10-CM | POA: Diagnosis not present

## 2020-08-07 DIAGNOSIS — M150 Primary generalized (osteo)arthritis: Secondary | ICD-10-CM | POA: Diagnosis not present

## 2020-08-07 DIAGNOSIS — R0602 Shortness of breath: Secondary | ICD-10-CM | POA: Diagnosis not present

## 2020-08-07 DIAGNOSIS — R760 Raised antibody titer: Secondary | ICD-10-CM | POA: Diagnosis not present

## 2020-08-09 DIAGNOSIS — G4733 Obstructive sleep apnea (adult) (pediatric): Secondary | ICD-10-CM | POA: Diagnosis not present

## 2020-08-09 DIAGNOSIS — R351 Nocturia: Secondary | ICD-10-CM | POA: Diagnosis not present

## 2020-08-11 DIAGNOSIS — H401111 Primary open-angle glaucoma, right eye, mild stage: Secondary | ICD-10-CM | POA: Diagnosis not present

## 2020-08-11 DIAGNOSIS — H0102B Squamous blepharitis left eye, upper and lower eyelids: Secondary | ICD-10-CM | POA: Diagnosis not present

## 2020-08-11 DIAGNOSIS — Z961 Presence of intraocular lens: Secondary | ICD-10-CM | POA: Diagnosis not present

## 2020-08-11 DIAGNOSIS — H401122 Primary open-angle glaucoma, left eye, moderate stage: Secondary | ICD-10-CM | POA: Diagnosis not present

## 2020-08-11 DIAGNOSIS — H16223 Keratoconjunctivitis sicca, not specified as Sjogren's, bilateral: Secondary | ICD-10-CM | POA: Diagnosis not present

## 2020-08-11 DIAGNOSIS — H0102A Squamous blepharitis right eye, upper and lower eyelids: Secondary | ICD-10-CM | POA: Diagnosis not present

## 2020-08-14 DIAGNOSIS — N182 Chronic kidney disease, stage 2 (mild): Secondary | ICD-10-CM | POA: Diagnosis not present

## 2020-08-14 DIAGNOSIS — Z23 Encounter for immunization: Secondary | ICD-10-CM | POA: Diagnosis not present

## 2020-08-14 DIAGNOSIS — G609 Hereditary and idiopathic neuropathy, unspecified: Secondary | ICD-10-CM | POA: Diagnosis not present

## 2020-08-14 DIAGNOSIS — R7303 Prediabetes: Secondary | ICD-10-CM | POA: Diagnosis not present

## 2020-08-14 DIAGNOSIS — M810 Age-related osteoporosis without current pathological fracture: Secondary | ICD-10-CM | POA: Diagnosis not present

## 2020-08-14 DIAGNOSIS — I10 Essential (primary) hypertension: Secondary | ICD-10-CM | POA: Diagnosis not present

## 2020-08-14 DIAGNOSIS — I2699 Other pulmonary embolism without acute cor pulmonale: Secondary | ICD-10-CM | POA: Diagnosis not present

## 2020-08-14 DIAGNOSIS — R29818 Other symptoms and signs involving the nervous system: Secondary | ICD-10-CM | POA: Diagnosis not present

## 2020-08-14 DIAGNOSIS — E782 Mixed hyperlipidemia: Secondary | ICD-10-CM | POA: Diagnosis not present

## 2020-08-14 DIAGNOSIS — I700 Atherosclerosis of aorta: Secondary | ICD-10-CM | POA: Diagnosis not present

## 2020-08-14 DIAGNOSIS — Z0000 Encounter for general adult medical examination without abnormal findings: Secondary | ICD-10-CM | POA: Diagnosis not present

## 2020-10-10 DIAGNOSIS — N281 Cyst of kidney, acquired: Secondary | ICD-10-CM | POA: Diagnosis not present

## 2020-10-10 DIAGNOSIS — R3581 Nocturnal polyuria: Secondary | ICD-10-CM | POA: Diagnosis not present

## 2020-10-11 ENCOUNTER — Other Ambulatory Visit: Payer: Self-pay | Admitting: Urology

## 2020-10-11 ENCOUNTER — Other Ambulatory Visit: Payer: Self-pay | Admitting: Cardiology

## 2020-10-11 DIAGNOSIS — I10 Essential (primary) hypertension: Secondary | ICD-10-CM

## 2020-10-11 DIAGNOSIS — Z1231 Encounter for screening mammogram for malignant neoplasm of breast: Secondary | ICD-10-CM | POA: Diagnosis not present

## 2020-10-11 DIAGNOSIS — N281 Cyst of kidney, acquired: Secondary | ICD-10-CM

## 2020-10-18 ENCOUNTER — Other Ambulatory Visit: Payer: Self-pay | Admitting: Cardiology

## 2020-10-18 DIAGNOSIS — I10 Essential (primary) hypertension: Secondary | ICD-10-CM

## 2020-10-20 ENCOUNTER — Other Ambulatory Visit: Payer: Self-pay | Admitting: Cardiology

## 2020-10-20 DIAGNOSIS — I10 Essential (primary) hypertension: Secondary | ICD-10-CM

## 2020-10-25 DIAGNOSIS — N2881 Hypertrophy of kidney: Secondary | ICD-10-CM | POA: Diagnosis not present

## 2020-10-25 DIAGNOSIS — N281 Cyst of kidney, acquired: Secondary | ICD-10-CM | POA: Diagnosis not present

## 2020-11-09 ENCOUNTER — Ambulatory Visit: Payer: Medicare Other | Admitting: Emergency Medicine

## 2020-11-09 DIAGNOSIS — N281 Cyst of kidney, acquired: Secondary | ICD-10-CM | POA: Diagnosis not present

## 2020-11-09 DIAGNOSIS — N2889 Other specified disorders of kidney and ureter: Secondary | ICD-10-CM | POA: Diagnosis not present

## 2020-12-01 ENCOUNTER — Other Ambulatory Visit (HOSPITAL_COMMUNITY): Payer: Self-pay | Admitting: Urology

## 2020-12-01 ENCOUNTER — Other Ambulatory Visit: Payer: Self-pay | Admitting: Urology

## 2020-12-01 DIAGNOSIS — D49512 Neoplasm of unspecified behavior of left kidney: Secondary | ICD-10-CM

## 2020-12-04 DIAGNOSIS — E782 Mixed hyperlipidemia: Secondary | ICD-10-CM | POA: Diagnosis not present

## 2020-12-04 DIAGNOSIS — I10 Essential (primary) hypertension: Secondary | ICD-10-CM | POA: Diagnosis not present

## 2020-12-04 DIAGNOSIS — R7303 Prediabetes: Secondary | ICD-10-CM | POA: Diagnosis not present

## 2020-12-04 DIAGNOSIS — N182 Chronic kidney disease, stage 2 (mild): Secondary | ICD-10-CM | POA: Diagnosis not present

## 2020-12-05 ENCOUNTER — Ambulatory Visit (INDEPENDENT_AMBULATORY_CARE_PROVIDER_SITE_OTHER): Payer: Medicare Other | Admitting: Emergency Medicine

## 2020-12-05 ENCOUNTER — Other Ambulatory Visit: Payer: Self-pay

## 2020-12-05 ENCOUNTER — Encounter: Payer: Self-pay | Admitting: Emergency Medicine

## 2020-12-05 DIAGNOSIS — R0602 Shortness of breath: Secondary | ICD-10-CM

## 2020-12-05 DIAGNOSIS — J449 Chronic obstructive pulmonary disease, unspecified: Secondary | ICD-10-CM | POA: Diagnosis not present

## 2020-12-05 DIAGNOSIS — I2699 Other pulmonary embolism without acute cor pulmonale: Secondary | ICD-10-CM

## 2020-12-05 MED ORDER — STIOLTO RESPIMAT 2.5-2.5 MCG/ACT IN AERS
2.0000 | INHALATION_SPRAY | Freq: Every day | RESPIRATORY_TRACT | 0 refills | Status: DC
Start: 2020-12-05 — End: 2021-01-02

## 2020-12-05 NOTE — Assessment & Plan Note (Signed)
She is using albuterol, unsure how much she is benefiting but possibly some.  I think we should try to treat her with scheduled BD therapy.  I will start [place] and continue until next time.  If she continues have dyspnea at that point then we will repeat her walking oximetry.  I think she would benefit from pulmonary rehab.  Discussed this with her today.

## 2020-12-05 NOTE — Patient Instructions (Signed)
We will start a new medication called Stiolto.  Take 2 puffs once daily. Keep your albuterol available to use 2 puffs if you need it for shortness of breath, chest tightness, wheezing. We talked today about the possible benefits of pulmonary rehab.  I think that this would help your breathing and your functional capacity.  Please let us know if you would like for [place] to make a referral so that you can participate. Follow with Dr Lamonte Sakai in 3 months or sooner if you have any problems.

## 2020-12-05 NOTE — Assessment & Plan Note (Signed)
Plan for lifelong anticoagulation as long she can tolerate.

## 2020-12-05 NOTE — Assessment & Plan Note (Signed)
Multifactorial dyspnea with history of PE, pulmonary hypertension, COPD, likely some deconditioning.  Most recent echocardiogram showed that her pulmonary hypertension was improved after anticoagulation.  I suspect that her COPD and deconditioning are driving her shortness of breath.  Plan to treat her COPD as below.  Discussed with her that she may benefit from pulmonary rehab.  She wants to think about it.

## 2020-12-05 NOTE — Addendum Note (Signed)
Addended by: Dierdre Highman on: 12/05/2020 03:56 PM   Modules accepted: Orders

## 2020-12-05 NOTE — Progress Notes (Signed)
Subjective:    Patient ID: Kaitlin Arnold, female    DOB: 02-Nov-1935, 85 y.o.   MRN: [id number]   HPI 85 year old former smoker (20 pack years) with a history of pulmonary embolism and lower extremity DVT in 11/2018 treated with Xarelto, positive lupus anticoagulant.  Also with hypertension, CAD, hyperlipidemia, occasional GERD.    She began to notice exertional SOB over the last 6 months, noticed with walking and housework. Better after resting. No associated wheeze that she has noticed but family has heard before. She does have daily nasal congestion, cough at night. No-productive. No CP, but describes a heaviness. Relieved by 5 minutes of rest. She gained about 10 lbs over the last year.   She is on Anoro since last month to see if she would benefit >> she reports that she has been using prn  Echocardiogram 03/18/2019 reviewed by me, showed intact LV function, grade 1 diastolic dysfunction, no evidence of pulmonary hypertension, intact RV size and function.  ROV 05/22/20 --this is a follow-up visit for a former smoker with multifactorial dyspnea.  She has a history of lower extremity DVT and pulmonary emboli, positive lupus anticoagulant.  Secondary pulmonary hypertension that improved with anticoagulation.  I repeated a ventilation/perfusion scan on 04/12/2020 which was low probability, did not show any evidence of chronic thromboembolic disease.  Chest x-ray on 04/12/2020 reviewed, was normal. She is still noticing exertional SOB with walking and stairs.   Pulmonary function testing done today, reviewed by me, shows mixed obstruction and restriction on spirometry without a bronchodilator response, mild restriction on volumes (TLC 78%), normal diffusion capacity.  ROV 12/05/20 --85 year old woman, history of tobacco, history of positive lupus anticoagulant with DVT/PE and secondary pulmonary hypertension, on anticoagulation.  She has mixed obstruction and restriction on spirometry, presumed COPD. She  has albuterol - uses about 2x a day. She is unsure whether it is helping her very much. She is SOB with walking over 127ft, with climbing stairs. No wheeze or cough. She has probably gained about 3 lbs since last summer.  No desat on walking RA in 03/2020   Review of Systems As per HPI       Objective:   Physical Exam Vitals:   12/05/20 1448  BP: 116/68  Pulse: 74  Temp: 97.6 F (36.4 C)  TempSrc: Temporal  SpO2: 98%  Weight: 232 lb 3.2 oz (105.3 kg)  Height: 5\' 6"  (1.676 m)   Gen: Pleasant, overweight woman, in no distress,  normal affect  ENT: No lesions,  mouth clear,  oropharynx clear, no postnasal drip  Neck: No JVD, no stridor  Lungs: No use of accessory muscles, distant, decreased at both bases, no wheezing  Cardiovascular: RRR, holosystolic murmur 3/6 with an intact S2  Musculoskeletal: No deformities, no cyanosis or clubbing  Neuro: alert, awake, non focal  Skin: Warm, no lesions or rash     Assessment & Plan:  SOB (shortness of breath) Multifactorial dyspnea with history of PE, pulmonary hypertension, COPD, likely some deconditioning.  Most recent echocardiogram showed that her pulmonary hypertension was improved after anticoagulation.  I suspect that her COPD and deconditioning are driving her shortness of breath.  Plan to treat her COPD as below.  Discussed with her that she may benefit from pulmonary rehab.  She wants to think about it.  Bilateral pulmonary embolism ([place]) Plan for lifelong anticoagulation as long she can tolerate.  COPD (chronic obstructive pulmonary disease) ([place]) She is using albuterol, unsure how much she is  benefiting but possibly some.  I think we should try to treat her with scheduled BD therapy.  I will start [place] and continue until next time.  If she continues have dyspnea at that point then we will repeat her walking oximetry.  I think she would benefit from pulmonary rehab.  Discussed this with her today.  Baltazar Apo, MD,  PhD 12/05/2020, 3:07 PM [facility] [phone] or if no answer [phone]

## 2020-12-11 DIAGNOSIS — Z23 Encounter for immunization: Secondary | ICD-10-CM | POA: Diagnosis not present

## 2020-12-11 DIAGNOSIS — R7303 Prediabetes: Secondary | ICD-10-CM | POA: Diagnosis not present

## 2020-12-11 DIAGNOSIS — R490 Dysphonia: Secondary | ICD-10-CM | POA: Diagnosis not present

## 2020-12-11 DIAGNOSIS — I10 Essential (primary) hypertension: Secondary | ICD-10-CM | POA: Diagnosis not present

## 2020-12-11 DIAGNOSIS — E782 Mixed hyperlipidemia: Secondary | ICD-10-CM | POA: Diagnosis not present

## 2020-12-12 DIAGNOSIS — H353131 Nonexudative age-related macular degeneration, bilateral, early dry stage: Secondary | ICD-10-CM | POA: Diagnosis not present

## 2020-12-12 DIAGNOSIS — Z961 Presence of intraocular lens: Secondary | ICD-10-CM | POA: Diagnosis not present

## 2020-12-12 DIAGNOSIS — H401111 Primary open-angle glaucoma, right eye, mild stage: Secondary | ICD-10-CM | POA: Diagnosis not present

## 2020-12-12 DIAGNOSIS — H0102A Squamous blepharitis right eye, upper and lower eyelids: Secondary | ICD-10-CM | POA: Diagnosis not present

## 2020-12-12 DIAGNOSIS — E119 Type 2 diabetes mellitus without complications: Secondary | ICD-10-CM | POA: Diagnosis not present

## 2020-12-12 DIAGNOSIS — H0102B Squamous blepharitis left eye, upper and lower eyelids: Secondary | ICD-10-CM | POA: Diagnosis not present

## 2020-12-12 DIAGNOSIS — H35033 Hypertensive retinopathy, bilateral: Secondary | ICD-10-CM | POA: Diagnosis not present

## 2020-12-12 DIAGNOSIS — H16223 Keratoconjunctivitis sicca, not specified as Sjogren's, bilateral: Secondary | ICD-10-CM | POA: Diagnosis not present

## 2020-12-12 DIAGNOSIS — H401122 Primary open-angle glaucoma, left eye, moderate stage: Secondary | ICD-10-CM | POA: Diagnosis not present

## 2020-12-14 ENCOUNTER — Ambulatory Visit (HOSPITAL_COMMUNITY): Payer: Medicare Other

## 2020-12-14 ENCOUNTER — Encounter (HOSPITAL_COMMUNITY): Payer: Self-pay

## 2020-12-15 DIAGNOSIS — D49512 Neoplasm of unspecified behavior of left kidney: Secondary | ICD-10-CM | POA: Diagnosis not present

## 2021-01-02 ENCOUNTER — Telehealth: Payer: Self-pay | Admitting: Emergency Medicine

## 2021-01-02 MED ORDER — STIOLTO RESPIMAT 2.5-2.5 MCG/ACT IN AERS: 2.0000 | INHALATION_SPRAY | Freq: Every day | RESPIRATORY_TRACT | 5 refills | Status: DC

## 2021-01-02 NOTE — Telephone Encounter (Signed)
Called and spoke with Patient. Patient stated Kaitlin Arnold is working good and she would like a prescription sent to [facility]. Requested prescription sent in to requested pharmacy. Nothing further at this time.

## 2021-02-12 DIAGNOSIS — Z23 Encounter for immunization: Secondary | ICD-10-CM | POA: Diagnosis not present

## 2021-03-01 ENCOUNTER — Encounter: Payer: Self-pay | Admitting: Emergency Medicine

## 2021-03-01 ENCOUNTER — Other Ambulatory Visit: Payer: Self-pay

## 2021-03-01 ENCOUNTER — Ambulatory Visit (INDEPENDENT_AMBULATORY_CARE_PROVIDER_SITE_OTHER): Payer: Medicare Other | Admitting: Emergency Medicine

## 2021-03-01 DIAGNOSIS — J449 Chronic obstructive pulmonary disease, unspecified: Secondary | ICD-10-CM | POA: Diagnosis not present

## 2021-03-01 DIAGNOSIS — R0602 Shortness of breath: Secondary | ICD-10-CM

## 2021-03-01 DIAGNOSIS — I2699 Other pulmonary embolism without acute cor pulmonale: Secondary | ICD-10-CM

## 2021-03-01 NOTE — Assessment & Plan Note (Signed)
Chronic thromboembolic disease with lupus anticoagulant.  She needs to be on anticoagulation for life.  She is tolerating and benefiting from Xarelto.

## 2021-03-01 NOTE — Assessment & Plan Note (Signed)
She did get some benefit from the [facility].  I think we should continue it.  She also pretreat significant exertion with albuterol and benefits.  She takes it before she goes shopping or go to church.  We will continue this regimen.

## 2021-03-01 NOTE — Patient Instructions (Addendum)
Please continue Stiolto 2 puffs once daily. Keep your albuterol available to use 2 puffs when needed for shortness of breath, chest tightness, wheezing. Continue your Xarelto as you have been taking it. We will check a repeat walking oximetry to make sure your oxygen level does not drop You may benefit from going to pulmonary rehab.  Know if you would like to pursue this. Follow with Dr Lamonte Sakai in 6 months or sooner if you have any problems

## 2021-03-01 NOTE — Progress Notes (Signed)
   Subjective:    Patient ID: Kaitlin Arnold, female    DOB: 12-11-1935, 85 y.o.   MRN: [id number]   HPI  ROV 12/05/20 --85 year old woman, history of tobacco, history of positive lupus anticoagulant with DVT/PE and secondary pulmonary hypertension, on anticoagulation.  She has mixed obstruction and restriction on spirometry, presumed COPD. She has albuterol - uses about 2x a day. She is unsure whether it is helping her very much. She is SOB with walking over 185ft, with climbing stairs. No wheeze or cough. She has probably gained about 3 lbs since last summer.  No desat on walking RA in 03/2020  ROV 03/01/21 -- Kaitlin Arnold is 85 and follows up for her history of multifactorial dyspnea in the setting of suspected COPD, chronic thromboembolic disease (on lifelong anticoagulation), secondary PAH, component of restrictive lung disease.  At her last visit we tried starting Stiolto to see if she would get benefit. She does believe that she has benefited some - is having less SOB w exertion. She can do housework in short spells. Has SOB with stairs. She is using albuterol prn, pretreats some exercise like going to church or the store. No cough or wheeze. She is hesitant to do pulm rehab because she worries about the walking required to get from parking to the rehab center.    Review of Systems As per HPI      Objective:   Physical Exam Vitals:   03/01/21 1525  BP: 138/70  Pulse: 77  Temp: 98.1 F (36.7 C)  TempSrc: Temporal  SpO2: 97%  Weight: 228 lb 9.6 oz (103.7 kg)  Height: 5\' 6"  (1.676 m)   Gen: Pleasant, overweight woman, in no distress,  normal affect  ENT: No lesions,  mouth clear,  oropharynx clear, no postnasal drip  Neck: No JVD, no stridor  Lungs: No use of accessory muscles, distant, soft insp crackles R base  Cardiovascular: RRR, holosystolic murmur 3/6 with an intact S2  Musculoskeletal: No deformities, no cyanosis or clubbing  Neuro: alert, awake, non focal  Skin: Warm,  no lesions or rash     Assessment & Plan:  COPD (chronic obstructive pulmonary disease) ([place]) She did get some benefit from the [place].  I think we should continue it.  She also pretreat significant exertion with albuterol and benefits.  She takes it before she goes shopping or go to church.  We will continue this regimen.  Bilateral pulmonary embolism (HCC) Chronic thromboembolic disease with lupus anticoagulant.  She needs to be on anticoagulation for life.  She is tolerating and benefiting from Xarelto.  SOB (shortness of breath) Multifactorial but at least in part due to deconditioning.  Will make sure she does not desaturate with ambulation today.  I think she might benefit from pulmonary rehab.  We talked about this today.  She is going to think about it and let me know.  Baltazar Apo, MD, PhD 03/01/2021, 3:59 PM [facility] [phone] or if no answer [phone]

## 2021-03-01 NOTE — Assessment & Plan Note (Signed)
Multifactorial but at least in part due to deconditioning.  Will make sure she does not desaturate with ambulation today.  I think she might benefit from pulmonary rehab.  We talked about this today.  She is going to think about it and let me know.

## 2021-04-02 DIAGNOSIS — R7303 Prediabetes: Secondary | ICD-10-CM | POA: Diagnosis not present

## 2021-04-02 DIAGNOSIS — I10 Essential (primary) hypertension: Secondary | ICD-10-CM | POA: Diagnosis not present

## 2021-04-09 DIAGNOSIS — M25519 Pain in unspecified shoulder: Secondary | ICD-10-CM | POA: Diagnosis not present

## 2021-04-09 DIAGNOSIS — E782 Mixed hyperlipidemia: Secondary | ICD-10-CM | POA: Diagnosis not present

## 2021-04-09 DIAGNOSIS — N182 Chronic kidney disease, stage 2 (mild): Secondary | ICD-10-CM | POA: Diagnosis not present

## 2021-04-09 DIAGNOSIS — I10 Essential (primary) hypertension: Secondary | ICD-10-CM | POA: Diagnosis not present

## 2021-04-09 DIAGNOSIS — R7303 Prediabetes: Secondary | ICD-10-CM | POA: Diagnosis not present

## 2021-04-11 DIAGNOSIS — H353131 Nonexudative age-related macular degeneration, bilateral, early dry stage: Secondary | ICD-10-CM | POA: Diagnosis not present

## 2021-04-11 DIAGNOSIS — E119 Type 2 diabetes mellitus without complications: Secondary | ICD-10-CM | POA: Diagnosis not present

## 2021-04-11 DIAGNOSIS — Z961 Presence of intraocular lens: Secondary | ICD-10-CM | POA: Diagnosis not present

## 2021-04-11 DIAGNOSIS — H401122 Primary open-angle glaucoma, left eye, moderate stage: Secondary | ICD-10-CM | POA: Diagnosis not present

## 2021-04-11 DIAGNOSIS — H35033 Hypertensive retinopathy, bilateral: Secondary | ICD-10-CM | POA: Diagnosis not present

## 2021-04-11 DIAGNOSIS — H16223 Keratoconjunctivitis sicca, not specified as Sjogren's, bilateral: Secondary | ICD-10-CM | POA: Diagnosis not present

## 2021-04-11 DIAGNOSIS — H401111 Primary open-angle glaucoma, right eye, mild stage: Secondary | ICD-10-CM | POA: Diagnosis not present

## 2021-04-11 DIAGNOSIS — H0102A Squamous blepharitis right eye, upper and lower eyelids: Secondary | ICD-10-CM | POA: Diagnosis not present

## 2021-04-11 DIAGNOSIS — H0102B Squamous blepharitis left eye, upper and lower eyelids: Secondary | ICD-10-CM | POA: Diagnosis not present

## 2021-04-13 DIAGNOSIS — U071 COVID-19: Secondary | ICD-10-CM | POA: Diagnosis not present

## 2021-04-24 DIAGNOSIS — M25519 Pain in unspecified shoulder: Secondary | ICD-10-CM | POA: Diagnosis not present

## 2021-04-30 DIAGNOSIS — N2889 Other specified disorders of kidney and ureter: Secondary | ICD-10-CM | POA: Diagnosis not present

## 2021-04-30 DIAGNOSIS — D1809 Hemangioma of other sites: Secondary | ICD-10-CM | POA: Diagnosis not present

## 2021-04-30 DIAGNOSIS — D49512 Neoplasm of unspecified behavior of left kidney: Secondary | ICD-10-CM | POA: Diagnosis not present

## 2021-04-30 DIAGNOSIS — N281 Cyst of kidney, acquired: Secondary | ICD-10-CM | POA: Diagnosis not present

## 2021-04-30 DIAGNOSIS — K5730 Diverticulosis of large intestine without perforation or abscess without bleeding: Secondary | ICD-10-CM | POA: Diagnosis not present

## 2021-05-14 DIAGNOSIS — M25511 Pain in right shoulder: Secondary | ICD-10-CM | POA: Diagnosis not present

## 2021-05-14 DIAGNOSIS — M25512 Pain in left shoulder: Secondary | ICD-10-CM | POA: Diagnosis not present

## 2021-05-14 DIAGNOSIS — M542 Cervicalgia: Secondary | ICD-10-CM | POA: Diagnosis not present

## 2021-05-14 DIAGNOSIS — M6281 Muscle weakness (generalized): Secondary | ICD-10-CM | POA: Diagnosis not present

## 2021-05-16 DIAGNOSIS — D49512 Neoplasm of unspecified behavior of left kidney: Secondary | ICD-10-CM | POA: Diagnosis not present

## 2021-05-16 DIAGNOSIS — N281 Cyst of kidney, acquired: Secondary | ICD-10-CM | POA: Diagnosis not present

## 2021-06-07 DIAGNOSIS — D49512 Neoplasm of unspecified behavior of left kidney: Secondary | ICD-10-CM | POA: Diagnosis not present

## 2021-06-07 DIAGNOSIS — N281 Cyst of kidney, acquired: Secondary | ICD-10-CM | POA: Diagnosis not present

## 2021-06-15 ENCOUNTER — Other Ambulatory Visit: Payer: Self-pay | Admitting: Cardiology

## 2021-06-15 DIAGNOSIS — I10 Essential (primary) hypertension: Secondary | ICD-10-CM

## 2021-06-18 DIAGNOSIS — Z20822 Contact with and (suspected) exposure to covid-19: Secondary | ICD-10-CM | POA: Diagnosis not present

## 2021-06-20 ENCOUNTER — Other Ambulatory Visit: Payer: Self-pay | Admitting: Cardiology

## 2021-06-20 DIAGNOSIS — I10 Essential (primary) hypertension: Secondary | ICD-10-CM

## 2021-06-21 ENCOUNTER — Other Ambulatory Visit: Payer: Self-pay | Admitting: Hematology and Oncology

## 2021-06-22 ENCOUNTER — Telehealth: Payer: Self-pay | Admitting: Hematology and Oncology

## 2021-06-22 NOTE — Telephone Encounter (Signed)
Scheduled per sch msg. Called and spoke with patient. Confirmed appt  

## 2021-07-16 ENCOUNTER — Other Ambulatory Visit: Payer: Self-pay | Admitting: Emergency Medicine

## 2021-07-30 ENCOUNTER — Other Ambulatory Visit: Payer: Self-pay | Admitting: Registered Nurse

## 2021-07-30 DIAGNOSIS — Z7901 Long term (current) use of anticoagulants: Secondary | ICD-10-CM | POA: Diagnosis not present

## 2021-07-30 DIAGNOSIS — Z86718 Personal history of other venous thrombosis and embolism: Secondary | ICD-10-CM | POA: Diagnosis not present

## 2021-07-30 DIAGNOSIS — M79605 Pain in left leg: Secondary | ICD-10-CM | POA: Diagnosis not present

## 2021-07-30 DIAGNOSIS — Z23 Encounter for immunization: Secondary | ICD-10-CM | POA: Diagnosis not present

## 2021-07-31 ENCOUNTER — Ambulatory Visit
Admission: RE | Admit: 2021-07-31 | Discharge: 2021-07-31 | Disposition: A | Discharge status: Home / Self Care | Payer: Medicare Other | Source: Ambulatory Visit | Attending: Registered Nurse | Admitting: Registered Nurse

## 2021-07-31 DIAGNOSIS — Z86718 Personal history of other venous thrombosis and embolism: Secondary | ICD-10-CM

## 2021-07-31 DIAGNOSIS — M79662 Pain in left lower leg: Secondary | ICD-10-CM | POA: Diagnosis not present

## 2021-07-31 DIAGNOSIS — M79605 Pain in left leg: Secondary | ICD-10-CM

## 2021-08-07 ENCOUNTER — Inpatient Hospital Stay: Payer: Medicare Other | Attending: Hematology and Oncology | Admitting: Hematology and Oncology

## 2021-08-07 NOTE — Assessment & Plan Note (Deleted)
Bilateral pulmonary embolism ([place]) 12/03/2018: Positive for acute pulmonary embolism with right heart strain 12/04/2018: DVT of right popliteal vein and right gastrocnemius vein Treatment: Xarelto since February 2020 CT chest angiogram 04/20/2019: Near total resolution of previous bilateral PE no evidence of persistent or recurrent PE  Lupus anticoagulant testing 07/14/2019: Positive  Recommendation: Repeat testing for lupus anticoagulant in 3 months.  This was performed today. If this test is positive, then she will need to remain on blood thinners for life (unless there are contraindications).  Toxicities to Xarelto: None Shortness of breath: Improved Leg swelling: Better  I will call her with the result of this test tomorrow and make the final decision on Xarelto. Depending on the decision she can be followed on an as-needed basis.

## 2021-08-08 DIAGNOSIS — M25562 Pain in left knee: Secondary | ICD-10-CM | POA: Diagnosis not present

## 2021-08-13 DIAGNOSIS — H0102B Squamous blepharitis left eye, upper and lower eyelids: Secondary | ICD-10-CM | POA: Diagnosis not present

## 2021-08-13 DIAGNOSIS — H353131 Nonexudative age-related macular degeneration, bilateral, early dry stage: Secondary | ICD-10-CM | POA: Diagnosis not present

## 2021-08-13 DIAGNOSIS — H401122 Primary open-angle glaucoma, left eye, moderate stage: Secondary | ICD-10-CM | POA: Diagnosis not present

## 2021-08-13 DIAGNOSIS — H401111 Primary open-angle glaucoma, right eye, mild stage: Secondary | ICD-10-CM | POA: Diagnosis not present

## 2021-08-13 DIAGNOSIS — Z961 Presence of intraocular lens: Secondary | ICD-10-CM | POA: Diagnosis not present

## 2021-08-13 DIAGNOSIS — H0102A Squamous blepharitis right eye, upper and lower eyelids: Secondary | ICD-10-CM | POA: Diagnosis not present

## 2021-08-13 DIAGNOSIS — E119 Type 2 diabetes mellitus without complications: Secondary | ICD-10-CM | POA: Diagnosis not present

## 2021-08-13 DIAGNOSIS — H35033 Hypertensive retinopathy, bilateral: Secondary | ICD-10-CM | POA: Diagnosis not present

## 2021-08-13 DIAGNOSIS — H16223 Keratoconjunctivitis sicca, not specified as Sjogren's, bilateral: Secondary | ICD-10-CM | POA: Diagnosis not present

## 2021-08-22 DIAGNOSIS — R7303 Prediabetes: Secondary | ICD-10-CM | POA: Diagnosis not present

## 2021-08-22 DIAGNOSIS — E782 Mixed hyperlipidemia: Secondary | ICD-10-CM | POA: Diagnosis not present

## 2021-08-22 DIAGNOSIS — N182 Chronic kidney disease, stage 2 (mild): Secondary | ICD-10-CM | POA: Diagnosis not present

## 2021-08-22 DIAGNOSIS — I10 Essential (primary) hypertension: Secondary | ICD-10-CM | POA: Diagnosis not present

## 2021-08-22 DIAGNOSIS — R5383 Other fatigue: Secondary | ICD-10-CM | POA: Diagnosis not present

## 2021-08-29 DIAGNOSIS — M150 Primary generalized (osteo)arthritis: Secondary | ICD-10-CM | POA: Diagnosis not present

## 2021-08-29 DIAGNOSIS — I872 Venous insufficiency (chronic) (peripheral): Secondary | ICD-10-CM | POA: Diagnosis not present

## 2021-08-29 DIAGNOSIS — N182 Chronic kidney disease, stage 2 (mild): Secondary | ICD-10-CM | POA: Diagnosis not present

## 2021-08-29 DIAGNOSIS — Z0000 Encounter for general adult medical examination without abnormal findings: Secondary | ICD-10-CM | POA: Diagnosis not present

## 2021-08-29 DIAGNOSIS — I10 Essential (primary) hypertension: Secondary | ICD-10-CM | POA: Diagnosis not present

## 2021-08-29 DIAGNOSIS — Z7901 Long term (current) use of anticoagulants: Secondary | ICD-10-CM | POA: Diagnosis not present

## 2021-08-29 DIAGNOSIS — G609 Hereditary and idiopathic neuropathy, unspecified: Secondary | ICD-10-CM | POA: Diagnosis not present

## 2021-08-29 DIAGNOSIS — E782 Mixed hyperlipidemia: Secondary | ICD-10-CM | POA: Diagnosis not present

## 2021-08-29 DIAGNOSIS — I700 Atherosclerosis of aorta: Secondary | ICD-10-CM | POA: Diagnosis not present

## 2021-08-29 DIAGNOSIS — D6869 Other thrombophilia: Secondary | ICD-10-CM | POA: Diagnosis not present

## 2021-08-29 DIAGNOSIS — R7303 Prediabetes: Secondary | ICD-10-CM | POA: Diagnosis not present

## 2021-08-30 DIAGNOSIS — Z23 Encounter for immunization: Secondary | ICD-10-CM | POA: Diagnosis not present

## 2021-08-31 ENCOUNTER — Other Ambulatory Visit: Payer: Self-pay

## 2021-08-31 ENCOUNTER — Encounter: Payer: Self-pay | Admitting: Emergency Medicine

## 2021-08-31 ENCOUNTER — Ambulatory Visit (INDEPENDENT_AMBULATORY_CARE_PROVIDER_SITE_OTHER): Payer: Medicare Other | Admitting: Emergency Medicine

## 2021-08-31 DIAGNOSIS — I2699 Other pulmonary embolism without acute cor pulmonale: Secondary | ICD-10-CM | POA: Diagnosis not present

## 2021-08-31 DIAGNOSIS — J449 Chronic obstructive pulmonary disease, unspecified: Secondary | ICD-10-CM | POA: Diagnosis not present

## 2021-08-31 NOTE — Assessment & Plan Note (Signed)
With a lupus anticoagulant.  She is on lifelong anticoagulation and will stay on DOAC

## 2021-08-31 NOTE — Assessment & Plan Note (Signed)
Multifactorial dyspnea but she did seem to get benefit from [employer].  She is questioning whether she might want to stop it in the future.  For now she is willing to continue it.  We can talk again about possibly taking a break from it to see how her breathing does.  Please continue Stiolto 2 puffs once daily. Keep your albuterol available to use 2 puffs to be needed for shortness of breath, chest tightness, wheezing.  You can take 2 puffs about 10 minutes prior to exertion or exercise Flu shot up-to-date COVID-19 vaccine up-to-date Follow Dr. Lamonte Sakai in 6 months or sooner if you have any problems.

## 2021-08-31 NOTE — Patient Instructions (Signed)
Please continue Stiolto 2 puffs once daily. Keep your albuterol available to use 2 puffs to be needed for shortness of breath, chest tightness, wheezing.  You can take 2 puffs about 10 minutes prior to exertion or exercise Flu shot up-to-date COVID-19 vaccine up-to-date Follow Dr. Lamonte Sakai in 6 months or sooner if you have any problems.

## 2021-08-31 NOTE — Progress Notes (Signed)
Subjective:    Patient ID: Kaitlin Arnold, female    DOB: [birth year], 85 y.o.   MRN: [id number]   HPI  ROV 12/05/20 --85 year old woman, history of tobacco, history of positive lupus anticoagulant with DVT/PE and secondary pulmonary hypertension, on anticoagulation.  She has mixed obstruction and restriction on spirometry, presumed COPD. She has albuterol - uses about 2x a day. She is unsure whether it is helping her very much. She is SOB with walking over 149ft, with climbing stairs. No wheeze or cough. She has probably gained about 3 lbs since last summer.  No desat on walking RA in 03/2020  ROV 03/01/21 -- Ms. Kaitlin Arnold is 45 and follows up for her history of multifactorial dyspnea in the setting of suspected COPD, chronic thromboembolic disease (on lifelong anticoagulation), secondary PAH, component of restrictive lung disease.  At her last visit we tried starting Stiolto to see if she would get benefit. She does believe that she has benefited some - is having less SOB w exertion. She can do housework in short spells. Has SOB with stairs. She is using albuterol prn, pretreats some exercise like going to church or the store. No cough or wheeze. She is hesitant to do pulm rehab because she worries about the walking required to get from parking to the rehab center.   ROV 08/31/21 --this follow-up visit for 85 year old woman with multifactorial shortness of breath.  She has chronic thromboembolic disease (on lifelong anticoagulation), probable COPD and a component of restrictive disease with associated secondary PAH.  She is on Xarelto, has a lupus anticoagulant.  She may benefit from [employer].  She reports that she believes that her breathing is better than the last time I saw her. She has albuterol, uses it rarely, but does use it if she goes out shopping. No wheeze, mucous, or cough.  We had talked about possibly going to pulmonary rehab. No desat after 1 lap at her last visit, stopped due to ankle pain.     Review of Systems As per HPI      Objective:   Physical Exam Vitals:   08/31/21 1137  BP: 118/68  Pulse: 72  Temp: 98 F (36.7 C)  TempSrc: Oral  SpO2: 100%  Weight: 232 lb (105.2 kg)  Height: 5\' 5"  (1.651 m)   Gen: Pleasant, overweight woman, in no distress,  normal affect  ENT: No lesions,  mouth clear,  oropharynx clear, no postnasal drip  Neck: No JVD, no stridor  Lungs: No use of accessory muscles, distant, soft insp crackles R base  Cardiovascular: RRR, holosystolic murmur 3/6 with an intact S2  Musculoskeletal: No deformities, no cyanosis or clubbing  Neuro: alert, awake, non focal  Skin: Warm, no lesions or rash     Assessment & Plan:  Bilateral pulmonary embolism (HCC) With a lupus anticoagulant.  She is on lifelong anticoagulation and will stay on DOAC  COPD (chronic obstructive pulmonary disease) ([name]) Multifactorial dyspnea but she did seem to get benefit from [employer].  She is questioning whether she might want to stop it in the future.  For now she is willing to continue it.  We can talk again about possibly taking a break from it to see how her breathing does.  Please continue Stiolto 2 puffs once daily. Keep your albuterol available to use 2 puffs to be needed for shortness of breath, chest tightness, wheezing.  You can take 2 puffs about 10 minutes prior to exertion or exercise Flu shot up-to-date COVID-19  vaccine up-to-date Follow Dr. Lamonte Sakai in 6 months or sooner if you have any problems.  Baltazar Apo, MD, PhD 08/31/2021, 12:08 PM [facility] [phone] or if no answer [phone]

## 2021-09-20 ENCOUNTER — Other Ambulatory Visit: Payer: Self-pay | Admitting: Hematology and Oncology

## 2021-09-21 ENCOUNTER — Telehealth: Payer: Self-pay | Admitting: Hematology and Oncology

## 2021-09-21 NOTE — Telephone Encounter (Signed)
Scheduled per sch msg. Called and left msg. Mailed printout  

## 2021-10-12 DIAGNOSIS — Z1231 Encounter for screening mammogram for malignant neoplasm of breast: Secondary | ICD-10-CM | POA: Diagnosis not present

## 2021-10-29 ENCOUNTER — Other Ambulatory Visit: Payer: Self-pay | Admitting: Emergency Medicine

## 2021-10-31 ENCOUNTER — Other Ambulatory Visit: Payer: Self-pay | Admitting: Emergency Medicine

## 2021-11-23 ENCOUNTER — Encounter (HOSPITAL_COMMUNITY): Payer: Self-pay

## 2021-11-23 ENCOUNTER — Other Ambulatory Visit: Payer: Self-pay

## 2021-11-23 ENCOUNTER — Emergency Department (HOSPITAL_COMMUNITY): Payer: Medicare Other

## 2021-11-23 ENCOUNTER — Emergency Department (HOSPITAL_COMMUNITY)
Admission: EM | Admit: 2021-11-23 | Discharge: 2021-11-23 | Disposition: A | Discharge status: Home / Self Care | Payer: Medicare Other | Attending: Student | Admitting: Student

## 2021-11-23 ENCOUNTER — Ambulatory Visit
Admission: EM | Admit: 2021-11-23 | Discharge: 2021-11-23 | Disposition: A | Discharge status: Home / Self Care | Payer: Medicare Other

## 2021-11-23 DIAGNOSIS — Z20822 Contact with and (suspected) exposure to covid-19: Secondary | ICD-10-CM | POA: Insufficient documentation

## 2021-11-23 DIAGNOSIS — I10 Essential (primary) hypertension: Secondary | ICD-10-CM | POA: Diagnosis not present

## 2021-11-23 DIAGNOSIS — R531 Weakness: Secondary | ICD-10-CM | POA: Diagnosis not present

## 2021-11-23 DIAGNOSIS — Z96653 Presence of artificial knee joint, bilateral: Secondary | ICD-10-CM | POA: Insufficient documentation

## 2021-11-23 DIAGNOSIS — Z87891 Personal history of nicotine dependence: Secondary | ICD-10-CM | POA: Diagnosis not present

## 2021-11-23 DIAGNOSIS — R42 Dizziness and giddiness: Secondary | ICD-10-CM | POA: Diagnosis not present

## 2021-11-23 DIAGNOSIS — M852 Hyperostosis of skull: Secondary | ICD-10-CM | POA: Diagnosis not present

## 2021-11-23 DIAGNOSIS — E119 Type 2 diabetes mellitus without complications: Secondary | ICD-10-CM | POA: Insufficient documentation

## 2021-11-23 DIAGNOSIS — Z7901 Long term (current) use of anticoagulants: Secondary | ICD-10-CM | POA: Diagnosis not present

## 2021-11-23 DIAGNOSIS — I6782 Cerebral ischemia: Secondary | ICD-10-CM | POA: Diagnosis not present

## 2021-11-23 DIAGNOSIS — I2510 Atherosclerotic heart disease of native coronary artery without angina pectoris: Secondary | ICD-10-CM | POA: Diagnosis not present

## 2021-11-23 DIAGNOSIS — J449 Chronic obstructive pulmonary disease, unspecified: Secondary | ICD-10-CM | POA: Diagnosis not present

## 2021-11-23 DIAGNOSIS — Z79899 Other long term (current) drug therapy: Secondary | ICD-10-CM | POA: Diagnosis not present

## 2021-11-23 LAB — CBC
HCT: 38.1 % (ref 36.0–46.0)
Hemoglobin: 12.2 g/dL (ref 12.0–15.0)
MCH: 27.4 pg (ref 26.0–34.0)
MCHC: 32 g/dL (ref 30.0–36.0)
MCV: 85.4 fL (ref 80.0–100.0)
Platelets: 221 10*3/uL (ref 150–400)
RBC: 4.46 MIL/uL (ref 3.87–5.11)
RDW: 14.7 % (ref 11.5–15.5)
WBC: 7.8 10*3/uL (ref 4.0–10.5)
nRBC: 0 % (ref 0.0–0.2)

## 2021-11-23 LAB — COMPREHENSIVE METABOLIC PANEL
ALT: 18 U/L (ref 0–44)
AST: 17 U/L (ref 15–41)
Albumin: 4.2 g/dL (ref 3.5–5.0)
Alkaline Phosphatase: 95 U/L (ref 38–126)
Anion gap: 7 (ref 5–15)
BUN: 20 mg/dL (ref 8–23)
CO2: 24 mmol/L (ref 22–32)
Calcium: 9.4 mg/dL (ref 8.9–10.3)
Chloride: 104 mmol/L (ref 98–111)
Creatinine, Ser: 0.91 mg/dL (ref 0.44–1.00)
GFR, Estimated: >60 mL/min (ref >60)
Glucose, Bld: 165 mg/dL — ABNORMAL HIGH (ref 70–99)
Potassium: 3.5 mmol/L (ref 3.5–5.1)
Sodium: 135 mmol/L (ref 135–145)
Total Bilirubin: 0.5 mg/dL (ref 0.3–1.2)
Total Protein: 7.3 g/dL (ref 6.5–8.1)

## 2021-11-23 LAB — DIFFERENTIAL
Abs Immature Granulocytes: 0.04 10*3/uL (ref 0.00–0.07)
Basophils Absolute: 0.1 10*3/uL (ref 0.0–0.1)
Basophils Relative: 1 %
Eosinophils Absolute: 0.1 10*3/uL (ref 0.0–0.5)
Eosinophils Relative: 1 %
Immature Granulocytes: 1 %
Lymphocytes Relative: 14 %
Lymphs Abs: 1.1 10*3/uL (ref 0.7–4.0)
Monocytes Absolute: 0.3 10*3/uL (ref 0.1–1.0)
Monocytes Relative: 3 %
Neutro Abs: 6.3 10*3/uL (ref 1.7–7.7)
Neutrophils Relative %: 80 %

## 2021-11-23 LAB — URINALYSIS, ROUTINE W REFLEX MICROSCOPIC
Bilirubin Urine: NEGATIVE
Glucose, UA: NEGATIVE mg/dL
Hgb urine dipstick: NEGATIVE
Ketones, ur: NEGATIVE mg/dL
Leukocytes,Ua: NEGATIVE
Nitrite: NEGATIVE
Protein, ur: NEGATIVE mg/dL
Specific Gravity, Urine: 1.009 (ref 1.005–1.030)
pH: 6 (ref 5.0–8.0)

## 2021-11-23 LAB — PROTIME-INR
INR: 1.6 — ABNORMAL HIGH (ref 0.8–1.2)
Prothrombin Time: 18.8 seconds — ABNORMAL HIGH (ref 11.4–15.2)

## 2021-11-23 LAB — RESP PANEL BY RT-PCR (FLU A&B, COVID) ARPGX2
Influenza A by PCR: NEGATIVE
Influenza B by PCR: NEGATIVE
SARS Coronavirus 2 by RT PCR: NEGATIVE

## 2021-11-23 LAB — TROPONIN I (HIGH SENSITIVITY): Troponin I (High Sensitivity): 6 ng/L (ref <18)

## 2021-11-23 LAB — APTT: aPTT: 53 seconds — ABNORMAL HIGH (ref 24–36)

## 2021-11-23 MED ORDER — DIPHENHYDRAMINE HCL 50 MG/ML IJ SOLN
25.0000 mg | Freq: Once | INTRAMUSCULAR | Status: AC
Start: 2021-11-23 — End: 2021-11-23
  Administered 2021-11-23: 25 mg via INTRAVENOUS
  Filled 2021-11-23: qty 1

## 2021-11-23 MED ORDER — PROCHLORPERAZINE EDISYLATE 10 MG/2ML IJ SOLN
10.0000 mg | Freq: Once | INTRAMUSCULAR | Status: AC
  Administered 2021-11-23: 10 mg via INTRAVENOUS
  Filled 2021-11-23: qty 2

## 2021-11-23 MED ORDER — LACTATED RINGERS IV BOLUS
1000.0000 mL | Freq: Once | INTRAVENOUS | Status: AC
  Administered 2021-11-23: 1000 mL via INTRAVENOUS

## 2021-11-23 NOTE — ED Provider Notes (Signed)
[facility]    CSN: [id number] Arrival date & time: 11/23/21  1130      History   Chief Complaint Chief Complaint  Patient presents with   Dizziness    HPI Kaitlin Arnold is a 86 y.o. female.   Patient presents with severe dizziness, nausea without vomiting, unsteady on her feet that started approximately 230 this morning.  Patient reports that she does have history of vertigo but that this "feels different".  She also reports that it has been more severe and has not improved since symptoms started.  Denies any associated headache, blurred vision, chest pain.  Does report that she has shortness of breath at baseline but has not worsened since symptoms started.  Denies diarrhea or abdominal pain.  Denies any recent blood in stool. Denies any recent upper respiratory symptoms.    Dizziness  Past Medical History:  Diagnosis Date   Aortic atherosclerosis ([place]) 12/04/2018   Arthritis    oa and pain left knee;  previous right total knee replacement  1998   CAD (coronary artery disease) 12/04/2018   Chest pain 12/18/2018   GERD (gastroesophageal reflux disease)    seldom   H/O cardiovascular stress test 05/27/12   STRESS TEST WAS DONE for cardiac clearance for left total knee replacement--given clearance by dr. Einar Gip.   EF 71%, NO ISCHEMIA   Hyperlipidemia    Hypertension    Shortness of breath    with exertion   SOB (shortness of breath) 12/18/2018    Patient Active Problem List   Diagnosis Date Noted   COPD (chronic obstructive pulmonary disease) ([place]) 12/05/2020   SOB (shortness of breath) 12/18/2018   Chest pain 12/18/2018   Coronary artery calcification seen on CT scan 12/18/2018   Essential hypertension 12/18/2018   Former tobacco use 12/18/2018   Hyperlipidemia associated with type 2 diabetes mellitus ([place]) 12/18/2018   Aortic atherosclerosis ([place]) 12/04/2018   CAD (coronary artery disease) 12/04/2018   Left kidney mass 12/03/2018   Bilateral pulmonary  embolism ([place]) 12/03/2018   Acute deep vein thrombosis (DVT) of right popliteal vein ([place]) 12/03/2018   Expected blood loss anemia 06/10/2012   UTI (urinary tract infection) 06/10/2012   S/P left TKA 06/09/2012    Past Surgical History:  Procedure Laterality Date   EYE SURGERY     HAND SURGERY Bilateral    HERNIA REPAIR     38 yrs ago   [place]   right total knee replacement   TOTAL KNEE ARTHROPLASTY  06/09/2012   Procedure: TOTAL KNEE ARTHROPLASTY;  Surgeon: Mauri Pole, MD;  Location: [facility];  Service: Orthopedics;  Laterality: Left;    OB History   No obstetric history on file.      Home Medications    Prior to Admission medications   Medication Sig Start Date End Date Taking? Authorizing Provider  acetaminophen (TYLENOL) 325 MG tablet Take 650 mg by mouth every 6 (six) hours as needed for moderate pain.     [provider]  albuterol (PROAIR HFA) 108 (90 Base) MCG/ACT inhaler Inhale 2 puffs into the lungs every 6 (six) hours as needed for wheezing or shortness of breath. 05/23/20   Collene Gobble, MD  albuterol (VENTOLIN HFA) 108 (90 Base) MCG/ACT inhaler Inhale 2 puffs into the lungs every 6 (six) hours as needed for wheezing or shortness of breath. 05/22/20   Collene Gobble, MD  amLODipine (NORVASC) 10 MG tablet TAKE 1 TABLET BY MOUTH  ONCE DAILY IN THE MORNING 06/21/21   Adrian Prows, MD  ANORO ELLIPTA 62.5-25 MCG/INH AEPB 1 puff daily. Patient not taking: Reported on 08/31/2021 02/03/20   [provider]  atorvastatin (LIPITOR) 20 MG tablet Take 20 mg by mouth every evening. 11/24/18   [provider]  brinzolamide (AZOPT) 1 % ophthalmic suspension  01/28/20   [provider]  cholecalciferol (VITAMIN D3) 25 MCG (1000 UT) tablet Take 1,000 Units by mouth daily.    [provider]  hydrochlorothiazide (HYDRODIURIL) 25 MG tablet Take 25 mg by mouth daily. 01/25/19   [provider]  ipratropium (ATROVENT) 0.06  % nasal spray Place 2 sprays into both nostrils in the morning and at bedtime. Patient not taking: Reported on 08/31/2021    [provider]  labetalol (NORMODYNE) 200 MG tablet Take 1 tablet by mouth twice daily 10/20/20   Adrian Prows, MD  losartan (COZAAR) 100 MG tablet Take 100 mg by mouth daily. 01/25/19   [provider]  multivitamin-lutein (OCUVITE-LUTEIN) CAPS capsule Take 1 capsule by mouth daily.    [provider]  Polyethyl Glycol-Propyl Glycol 0.4-0.3 % SOLN Apply 1 drop to eye 3 (three) times daily as needed (dry eyes).     [provider]  SIMBRINZA 1-0.2 % SUSP INSTILL 1 DROP INTO EACH EYE TWICE DAILY 03/30/20   [provider]  STIOLTO RESPIMAT 2.5-2.5 MCG/ACT AERS INHALE 2 PUFFS BY MOUTH ONCE DAILY 10/29/21   Collene Gobble, MD  triamcinolone cream (KENALOG) 0.1 % Apply 1 application topically 2 (two) times daily as needed (dermatitis).  11/10/18   [provider]  XARELTO 20 MG TABS tablet TAKE 1 TABLET BY MOUTH ONCE DAILY WITH SUPPER 09/20/21   Nicholas Lose, MD    Family History Family History  Problem Relation Age of Onset   Diabetes Father    Heart attack Sister    Colon cancer Sister    Colon cancer Sister     Social History Social History   Tobacco Use   Smoking status: Former    Packs/day: 0.50    Years: 40.00    Pack years: 20.00    Types: Cigarettes    Quit date: 12/04/2018    Years since quitting: 2.9   Smokeless tobacco: Never   Tobacco comments:    not everyday smoker--if smoking 4 to 5 cigs a day  Vaping Use   Vaping Use: Never used  Substance Use Topics   Alcohol use: Yes    Comment: very seldom   Drug use: No     Allergies   Patient has no known allergies.   Review of Systems Review of Systems Per HPI  Physical Exam Triage Vital Signs ED Triage Vitals [11/23/21 1135]  Enc Vitals Group     BP (!) 158/86     Pulse Rate 68     Resp 19     Temp 97.9 F (36.6 C)     Temp src       SpO2 95 %     Weight      Height      Head Circumference      Peak Flow      Pain Score      Pain Loc      Pain Edu?      Excl. in [place]?    No data found.  Updated Vital Signs BP (!) 158/86    Pulse 68    Temp 97.9 F (36.6 C)  Resp 19    SpO2 95%   Visual Acuity Right Eye Distance:   Left Eye Distance:   Bilateral Distance:    Right Eye Near:   Left Eye Near:    Bilateral Near:     Physical Exam Constitutional:      General: She is not in acute distress.    Appearance: Normal appearance. She is not toxic-appearing or diaphoretic.  HENT:     Head: Normocephalic and atraumatic.     Right Ear: Tympanic membrane and ear canal normal.     Left Ear: Ear canal normal.     Nose: Nose normal.     Mouth/Throat:     Mouth: Mucous membranes are moist.     Pharynx: No posterior oropharyngeal erythema.  Eyes:     Extraocular Movements: Extraocular movements intact.     Conjunctiva/sclera: Conjunctivae normal.     Pupils: Pupils are equal, round, and reactive to light.  Cardiovascular:     Rate and Rhythm: Normal rate and regular rhythm.     Pulses: Normal pulses.     Heart sounds: Normal heart sounds.  Pulmonary:     Effort: Pulmonary effort is normal. No respiratory distress.     Breath sounds: Wheezing present.     Comments: Left lung Abdominal:     General: Bowel sounds are normal. There is no distension.     Palpations: Abdomen is soft.     Tenderness: There is no abdominal tenderness.  Neurological:     General: No focal deficit present.     Mental Status: She is alert and oriented to person, place, and time. Mental status is at baseline.     Cranial Nerves: Cranial nerves 2-12 are intact.     Sensory: Sensation is intact.     Motor: Motor function is intact.     Coordination: Coordination is intact.     Gait: Gait is intact.  Psychiatric:        Mood and Affect: Mood normal.        Behavior: Behavior normal.        Thought Content: Thought content normal.         Judgment: Judgment normal.     UC Treatments / Results  Labs (all labs ordered are listed, but only abnormal results are displayed) Labs Reviewed - No data to display  EKG   Radiology No results found.  Procedures Procedures (including critical care time)  Medications Ordered in UC Medications - No data to display  Initial Impression / Assessment and Plan / UC Course  I have reviewed the triage vital signs and the nursing notes.  Pertinent labs & imaging results that were available during my care of the patient were reviewed by me and considered in my medical decision making (see chart for details).     Patient does have wheezing on left lung exam but unsure if this is related to patient's dizziness.  I do think that patient needs very extensive evaluation given age and risk factors.  Asked patient that it will be best for her to go to the hospital for further evaluation and management.  Patient and caregiver were agreeable to plan.  Vital signs stable at discharge.  Agree with patient's family member transporting her to the hospital. Final Clinical Impressions(s) / UC Diagnoses   Final diagnoses:  Dizziness and giddiness     Discharge Instructions      Please go to the emergency department as soon as you leave urgent  care for further evaluation and management.    ED Prescriptions   None    PDMP not reviewed this encounter.   Teodora Medici, [place] 11/23/21 [phone]

## 2021-11-23 NOTE — ED Provider Notes (Signed)
[facility] DEPT Provider Note  CSN: [id number] Arrival date & time: 11/23/21 1213  Chief Complaint(s) Dizziness and Weakness  HPI Kaitlin Arnold is a 86 y.o. female who presents emergency department from urgent care for evaluation of dizziness.  Patient states that at 230 this morning she awoke to use the bathroom and felt very unsteady on her feet.  She used the restroom and then return to the bed, and felt so unsteady on her feet that she fell into her bed.  She then laid there for approximately 7 hours where she states that her symptoms were fatigable but worsened when turning side to side.  She then called her daughter who brought her to urgent care and due to concern for possible stroke she was sent to the emergency department for evaluation.  On arrival to the emergency department, she denies numbness, tingling, visual deficits, weakness or other systemic symptoms.  Patient adamantly denies vertigo and states that this is primarily unsteadiness on her feet and an associated headache.  She describes her headache as a pressure bandlike sensation over bilateral temples.  Denies chest pain, shortness of breath, abdominal pain, or other systemic symptoms.   Dizziness Associated symptoms: weakness   Weakness Associated symptoms: dizziness    Past Medical History Past Medical History:  Diagnosis Date   Aortic atherosclerosis ([place]) 12/04/2018   Arthritis    oa and pain left knee;  previous right total knee replacement  1998   CAD (coronary artery disease) 12/04/2018   Chest pain 12/18/2018   GERD (gastroesophageal reflux disease)    seldom   H/O cardiovascular stress test 05/27/12   STRESS TEST WAS DONE for cardiac clearance for left total knee replacement--given clearance by dr. Einar Gip.   EF 71%, NO ISCHEMIA   Hyperlipidemia    Hypertension    Shortness of breath    with exertion   SOB (shortness of breath) 12/18/2018   Patient Active Problem List   Diagnosis  Date Noted   COPD (chronic obstructive pulmonary disease) ([place]) 12/05/2020   SOB (shortness of breath) 12/18/2018   Chest pain 12/18/2018   Coronary artery calcification seen on CT scan 12/18/2018   Essential hypertension 12/18/2018   Former tobacco use 12/18/2018   Hyperlipidemia associated with type 2 diabetes mellitus ([place]) 12/18/2018   Aortic atherosclerosis ([place]) 12/04/2018   CAD (coronary artery disease) 12/04/2018   Left kidney mass 12/03/2018   Bilateral pulmonary embolism ([place]) 12/03/2018   Acute deep vein thrombosis (DVT) of right popliteal vein ([place]) 12/03/2018   Expected blood loss anemia 06/10/2012   UTI (urinary tract infection) 06/10/2012   S/P left TKA 06/09/2012   Home Medication(s) Prior to Admission medications   Medication Sig Start Date End Date Taking? Authorizing Provider  acetaminophen (TYLENOL) 325 MG tablet Take 650 mg by mouth every 6 (six) hours as needed for moderate pain.     [provider]  albuterol (PROAIR HFA) 108 (90 Base) MCG/ACT inhaler Inhale 2 puffs into the lungs every 6 (six) hours as needed for wheezing or shortness of breath. 05/23/20   Collene Gobble, MD  albuterol (VENTOLIN HFA) 108 (90 Base) MCG/ACT inhaler Inhale 2 puffs into the lungs every 6 (six) hours as needed for wheezing or shortness of breath. 05/22/20   Collene Gobble, MD  amLODipine (NORVASC) 10 MG tablet TAKE 1 TABLET BY MOUTH ONCE DAILY IN THE MORNING 06/21/21   Adrian Prows, MD  ANORO ELLIPTA 62.5-25 MCG/INH AEPB 1 puff daily. Patient  not taking: Reported on 08/31/2021 02/03/20   [provider]  atorvastatin (LIPITOR) 20 MG tablet Take 20 mg by mouth every evening. 11/24/18   [provider]  brinzolamide (AZOPT) 1 % ophthalmic suspension  01/28/20   [provider]  cholecalciferol (VITAMIN D3) 25 MCG (1000 UT) tablet Take 1,000 Units by mouth daily.    [provider]  hydrochlorothiazide (HYDRODIURIL) 25 MG tablet Take 25 mg by mouth  daily. 01/25/19   [provider]  ipratropium (ATROVENT) 0.06 % nasal spray Place 2 sprays into both nostrils in the morning and at bedtime. Patient not taking: Reported on 08/31/2021    [provider]  labetalol (NORMODYNE) 200 MG tablet Take 1 tablet by mouth twice daily 10/20/20   Adrian Prows, MD  losartan (COZAAR) 100 MG tablet Take 100 mg by mouth daily. 01/25/19   [provider]  multivitamin-lutein (OCUVITE-LUTEIN) CAPS capsule Take 1 capsule by mouth daily.    [provider]  Polyethyl Glycol-Propyl Glycol 0.4-0.3 % SOLN Apply 1 drop to eye 3 (three) times daily as needed (dry eyes).     [provider]  SIMBRINZA 1-0.2 % SUSP INSTILL 1 DROP INTO EACH EYE TWICE DAILY 03/30/20   [provider]  STIOLTO RESPIMAT 2.5-2.5 MCG/ACT AERS INHALE 2 PUFFS BY MOUTH ONCE DAILY 10/29/21   Collene Gobble, MD  triamcinolone cream (KENALOG) 0.1 % Apply 1 application topically 2 (two) times daily as needed (dermatitis).  11/10/18   [provider]  XARELTO 20 MG TABS tablet TAKE 1 TABLET BY MOUTH ONCE DAILY WITH SUPPER 09/20/21   Nicholas Lose, MD                                                                                                                                    Past Surgical History Past Surgical History:  Procedure Laterality Date   EYE SURGERY     HAND SURGERY Bilateral    HERNIA REPAIR     38 yrs ago   [place]   right total knee replacement   TOTAL KNEE ARTHROPLASTY  06/09/2012   Procedure: TOTAL KNEE ARTHROPLASTY;  Surgeon: Mauri Pole, MD;  Location: [facility];  Service: Orthopedics;  Laterality: Left;   Family History Family History  Problem Relation Age of Onset   Diabetes Father    Heart attack Sister    Colon cancer Sister    Colon cancer Sister     Social History Social History   Tobacco Use   Smoking status: Former    Packs/day: 0.50    Years: 40.00    Pack years: 20.00    Types:  Cigarettes    Quit date: 12/04/2018    Years since quitting: 2.9   Smokeless tobacco: Never   Tobacco comments:    not everyday smoker--if smoking 4 to 5 cigs a day  Vaping Use   Vaping Use:  Never used  Substance Use Topics   Alcohol use: Yes    Comment: very seldom   Drug use: No   Allergies Patient has no known allergies.  Review of Systems Review of Systems  Neurological:  Positive for dizziness and weakness.   Physical Exam Vital Signs  I have reviewed the triage vital signs BP (!) 150/76    Pulse 73    Resp (!) 21    Ht 5\' 5"  (1.651 m)    Wt 104.3 kg    SpO2 93%    BMI 38.27 kg/m   Physical Exam Vitals and nursing note reviewed.  Constitutional:      General: She is not in acute distress.    Appearance: She is well-developed.  HENT:     Head: Normocephalic and atraumatic.  Eyes:     Conjunctiva/sclera: Conjunctivae normal.  Cardiovascular:     Rate and Rhythm: Normal rate and regular rhythm.     Heart sounds: No murmur heard. Pulmonary:     Effort: Pulmonary effort is normal. No respiratory distress.     Breath sounds: Normal breath sounds.  Abdominal:     Palpations: Abdomen is soft.     Tenderness: There is no abdominal tenderness.  Musculoskeletal:        General: No swelling.     Cervical back: Neck supple.  Skin:    General: Skin is warm and dry.     Capillary Refill: Capillary refill takes less than 2 seconds.  Neurological:     Mental Status: She is alert and oriented to person, place, and time.     Cranial Nerves: No cranial nerve deficit.     Sensory: No sensory deficit.     Motor: No weakness.  Psychiatric:        Mood and Affect: Mood normal.    ED Results and Treatments Labs (all labs ordered are listed, but only abnormal results are displayed) Labs Reviewed  PROTIME-INR - Abnormal; Notable for the following components:      Result Value   Prothrombin Time 18.8 (*)    INR 1.6 (*)    All other components within normal limits  APTT -  Abnormal; Notable for the following components:   aPTT 53 (*)    All other components within normal limits  COMPREHENSIVE METABOLIC PANEL - Abnormal; Notable for the following components:   Glucose, Bld 165 (*)    All other components within normal limits  URINALYSIS, ROUTINE W REFLEX MICROSCOPIC - Abnormal; Notable for the following components:   Color, Urine STRAW (*)    All other components within normal limits  RESP PANEL BY RT-PCR (FLU A&B, COVID) ARPGX2  CBC  DIFFERENTIAL  TROPONIN I (HIGH SENSITIVITY)                                                                                                                          Radiology CT HEAD WO CONTRAST  Result Date: 11/23/2021 CLINICAL DATA:  Dizziness, persistent/recurrent, cardiac or vascular cause suspected EXAM: CT HEAD WITHOUT CONTRAST TECHNIQUE: Contiguous axial images were obtained from the base of the skull through the vertex without intravenous contrast. RADIATION DOSE REDUCTION: This exam was performed according to the departmental dose-optimization program which includes automated exposure control, adjustment of the mA and/or kV according to patient size and/or use of iterative reconstruction technique. COMPARISON:  Head CT 08/28/2002 FINDINGS: Brain: No evidence of acute intracranial hemorrhage or extra-axial collection.No evidence of mass lesion/concern mass effect.The ventricles are normal in size.Scattered subcortical and periventricular white matter hypodensities, nonspecific but likely sequela of chronic small vessel ischemic disease. Vascular: No hyperdense vessel or unexpected calcification. Skull: Hyperostosis frontalis internus. Prominent bony protuberance off of the posterior aspect of the occipital bone with nonaggressive features. Sinuses/Orbits: No acute finding. Other: None. IMPRESSION: No acute intracranial abnormality. Sequelae of chronic small vessel ischemic disease. Electronically Signed   By: Maurine Simmering M.D.    On: 11/23/2021 13:26    Pertinent labs & imaging results that were available during my care of the patient were reviewed by me and considered in my medical decision making (see MDM for details).  Medications Ordered in ED Medications  lactated ringers bolus 1,000 mL (1,000 mLs Intravenous New Bag/Given 11/23/21 1346)  prochlorperazine (COMPAZINE) injection 10 mg (10 mg Intravenous Given 11/23/21 1350)  diphenhydrAMINE (BENADRYL) injection 25 mg (25 mg Intravenous Given 11/23/21 1348)                                                                                                                                     Procedures Procedures  (including critical care time)  Medical Decision Making / ED Course   This patient presents to the ED for concern of dizziness, this involves an extensive number of treatment options, and is a complaint that carries with it a high risk of complications and morbidity.  The differential diagnosis includes BPPV, orthostatic hypotension, CVA, migraine  MDM: Emergency department for evaluation of dizziness and headache.  Physical exam is unremarkable with no focal motor or sensory deficits, no cranial nerve deficits, no nystagmus.  Laboratory evaluation largely unremarkable outside of an elevated INR which can be seen with her blood thinner use, glucose mildly elevated 165 but no evidence of DKA.  Urinalysis unremarkable.  CT head unremarkable.  Patient given a migraine cocktail with Compazine and Benadryl and on reevaluation, she states that her symptoms have resolved.  Patient is able to ambulate to the bathroom without difficulty or assistance.  I spoke at length with the patient's family and using shared decision making, we decided to forego additional CT imaging as we have general low suspicion for stroke at this time and patient's symptoms more consistent with positional vertigo versus orthostatic hypotension.  Patient fluid resuscitated and discharged in the care  of her daughter.   Additional history obtained: -Additional history obtained from multiple family members -External records from outside  source obtained and reviewed including: Chart review including previous notes, labs, imaging, consultation notes   Lab Tests: -I ordered, reviewed, and interpreted labs.   The pertinent results include:   Labs Reviewed  PROTIME-INR - Abnormal; Notable for the following components:      Result Value   Prothrombin Time 18.8 (*)    INR 1.6 (*)    All other components within normal limits  APTT - Abnormal; Notable for the following components:   aPTT 53 (*)    All other components within normal limits  COMPREHENSIVE METABOLIC PANEL - Abnormal; Notable for the following components:   Glucose, Bld 165 (*)    All other components within normal limits  URINALYSIS, ROUTINE W REFLEX MICROSCOPIC - Abnormal; Notable for the following components:   Color, Urine STRAW (*)    All other components within normal limits  RESP PANEL BY RT-PCR (FLU A&B, COVID) ARPGX2  CBC  DIFFERENTIAL  TROPONIN I (HIGH SENSITIVITY)      EKG   EKG Interpretation  Date/Time:  Friday November 23 2021 13:10:46 EST Ventricular Rate:  75 PR Interval:  193 QRS Duration: 156 QT Interval:  452 QTC Calculation: 505 R Axis:   -40 Text Interpretation: Sinus arrhythmia RBBB and LAFB Probable left ventricular hypertrophy Confirmed by [place] ([id number]) on 11/23/2021 2:12:09 PM         Imaging Studies ordered: I ordered imaging studies including CT head I independently visualized and interpreted imaging. I agree with the radiologist interpretation   Medicines ordered and prescription drug management: Meds ordered this encounter  Medications   lactated ringers bolus 1,000 mL   prochlorperazine (COMPAZINE) injection 10 mg   diphenhydrAMINE (BENADRYL) injection 25 mg    -I have reviewed the patients home medicines and have made adjustments as needed  Critical  interventions none   Cardiac Monitoring: The patient was maintained on a cardiac monitor.  I personally viewed and interpreted the cardiac monitored which showed an underlying rhythm of: Atrial fibrillation  Social Determinants of Health:  Factors impacting patients care include: Lives alone   Reevaluation: After the interventions noted above, I reevaluated the patient and found that they have :improved  Co morbidities that complicate the patient evaluation  Past Medical History:  Diagnosis Date   Aortic atherosclerosis ([place]) 12/04/2018   Arthritis    oa and pain left knee;  previous right total knee replacement  1998   CAD (coronary artery disease) 12/04/2018   Chest pain 12/18/2018   GERD (gastroesophageal reflux disease)    seldom   H/O cardiovascular stress test 05/27/12   STRESS TEST WAS DONE for cardiac clearance for left total knee replacement--given clearance by dr. Einar Gip.   EF 71%, NO ISCHEMIA   Hyperlipidemia    Hypertension    Shortness of breath    with exertion   SOB (shortness of breath) 12/18/2018      Dispostion: I considered admission for this patient, but patient is able to ambulate without difficulty and her symptoms have resolved.  Patient is safe for outpatient follow up.     Final Clinical Impression(s) / ED Diagnoses Final diagnoses:  Dizziness     @PCDICTATION @    Teressa Lower, MD 11/23/21 1622

## 2021-11-23 NOTE — ED Provider Triage Note (Signed)
Emergency Medicine Provider Triage Evaluation Note  Kaitlin Arnold , a 86 y.o. female  was evaluated in triage.  Pt complains of dizziness and weakness.  No previous history of stroke.  She does have a history of hypertension and high cholesterol.  She first noticed something was wrong at about 2:30 AM.  She states that she got up to use the bathroom, stood at the side of her bed for several minutes and then fell back down.  She describes feeling very dizzy, but denies a distinct spinning sensation.  She reports a "squeezing" sensation in her head and is having trouble describing this sensation.  She is having trouble walking and also weakness.  She went to urgent care and was referred to the emergency department. Patient denies other signs of stroke including: facial droop, slurred speech, aphasia.  Review of Systems  Positive: Weakness, dizziness Negative: Chest pain, shortness of breath  Physical Exam  Ht 5\' 5"  (1.651 m)    Wt 104.3 kg    BMI 38.27 kg/m  Gen:   Awake, no distress   Resp:  Normal effort  MSK:   Moves extremities without difficulty  Other:  Cranial nerves III through XII grossly intact but patient does have difficulty tracking my finger.  No obvious nystagmus.  Negative Romberg.  Normal gross upper and lower extremity strength.  Medical Decision Making  Medically screening exam initiated at 12:53 PM.  Appropriate orders placed.  Kaitlin Arnold was informed that the remainder of the evaluation will be completed by another provider, this initial triage assessment does not replace that evaluation, and the importance of remaining in the ED until their evaluation is complete.  No code stroke due to lack of focal neurodeficits, VAN negative, greater than 4.5 hours since onset.   Kaitlin Cater, PA-C 11/23/21 1256

## 2021-11-23 NOTE — Discharge Instructions (Signed)
Please go to the emergency department as soon as you leave urgent care for further evaluation and management. ?

## 2021-11-23 NOTE — ED Triage Notes (Signed)
Pt presents with complaints of severe dizziness and nausea that came on all of a sudden at 230 this morning when she woke up to use the bathroom. History of vertigo but this feels different. The patient is unsteady on her feet. Denies any pain.

## 2021-11-23 NOTE — ED Triage Notes (Signed)
Patient reports that she woke at 0230 today and c/o dizziness and weakness.  Patient reports a history of vertigo, but states it feels different. Patient also feel weakness of lower extremities.

## 2021-11-23 NOTE — ED Notes (Signed)
Patient was able to ambulate to the bathroom and back without assistance.

## 2021-11-28 DIAGNOSIS — H8110 Benign paroxysmal vertigo, unspecified ear: Secondary | ICD-10-CM | POA: Diagnosis not present

## 2021-11-29 ENCOUNTER — Other Ambulatory Visit: Payer: Self-pay | Admitting: Cardiology

## 2021-11-29 DIAGNOSIS — I10 Essential (primary) hypertension: Secondary | ICD-10-CM

## 2021-12-01 ENCOUNTER — Other Ambulatory Visit: Payer: Self-pay | Admitting: Cardiology

## 2021-12-01 DIAGNOSIS — I10 Essential (primary) hypertension: Secondary | ICD-10-CM

## 2021-12-03 ENCOUNTER — Other Ambulatory Visit: Payer: Self-pay | Admitting: Emergency Medicine

## 2021-12-05 ENCOUNTER — Other Ambulatory Visit: Payer: Self-pay | Admitting: Cardiology

## 2021-12-05 DIAGNOSIS — I10 Essential (primary) hypertension: Secondary | ICD-10-CM

## 2021-12-12 DIAGNOSIS — H0102A Squamous blepharitis right eye, upper and lower eyelids: Secondary | ICD-10-CM | POA: Diagnosis not present

## 2021-12-12 DIAGNOSIS — Z961 Presence of intraocular lens: Secondary | ICD-10-CM | POA: Diagnosis not present

## 2021-12-12 DIAGNOSIS — H16223 Keratoconjunctivitis sicca, not specified as Sjogren's, bilateral: Secondary | ICD-10-CM | POA: Diagnosis not present

## 2021-12-12 DIAGNOSIS — H0102B Squamous blepharitis left eye, upper and lower eyelids: Secondary | ICD-10-CM | POA: Diagnosis not present

## 2021-12-12 DIAGNOSIS — E119 Type 2 diabetes mellitus without complications: Secondary | ICD-10-CM | POA: Diagnosis not present

## 2021-12-12 DIAGNOSIS — H401111 Primary open-angle glaucoma, right eye, mild stage: Secondary | ICD-10-CM | POA: Diagnosis not present

## 2021-12-12 DIAGNOSIS — H401122 Primary open-angle glaucoma, left eye, moderate stage: Secondary | ICD-10-CM | POA: Diagnosis not present

## 2021-12-19 NOTE — Progress Notes (Signed)
? ?Patient Care Team: ?Merrilee Seashore, MD as PCP - General (Internal Medicine) ? ?DIAGNOSIS:  ?  ICD-10-CM   ?1. Acute deep vein thrombosis (DVT) of right popliteal vein (HCC)  I82.431   ?  ? ? ?CHIEF COMPLIANT: Follow-up of PE and DVT on Xarelto ? ?INTERVAL HISTORY: Kaitlin Arnold is a 86 y.o. with above-mentioned history of PE and DVT currently on anticoagulation with Xarelto. She presents to the clinic today for follow-up.  ?Tolerating xarelto well. ? ?ALLERGIES:  has No Known Allergies. ? ?MEDICATIONS:  ?Current Outpatient Medications  ?Medication Sig Dispense Refill  ? acetaminophen (TYLENOL) 325 MG tablet Take 650 mg by mouth every 6 (six) hours as needed for moderate pain.     ? albuterol (PROAIR HFA) 108 (90 Base) MCG/ACT inhaler Inhale 2 puffs into the lungs every 6 (six) hours as needed for wheezing or shortness of breath. 18 g 5  ? albuterol (VENTOLIN HFA) 108 (90 Base) MCG/ACT inhaler Inhale 2 puffs into the lungs every 6 (six) hours as needed for wheezing or shortness of breath. 8 g 6  ? amLODipine (NORVASC) 10 MG tablet TAKE 1 TABLET BY MOUTH ONCE DAILY IN THE MORNING 90 tablet 1  ? ANORO ELLIPTA 62.5-25 MCG/INH AEPB 1 puff daily. (Patient not taking: Reported on 08/31/2021)    ? atorvastatin (LIPITOR) 20 MG tablet Take 20 mg by mouth every evening.    ? brinzolamide (AZOPT) 1 % ophthalmic suspension     ? cholecalciferol (VITAMIN D3) 25 MCG (1000 UT) tablet Take 1,000 Units by mouth daily.    ? hydrochlorothiazide (HYDRODIURIL) 25 MG tablet Take 25 mg by mouth daily.    ? ipratropium (ATROVENT) 0.06 % nasal spray Place 2 sprays into both nostrils in the morning and at bedtime. (Patient not taking: Reported on 08/31/2021)    ? labetalol (NORMODYNE) 200 MG tablet Take 1 tablet by mouth twice daily 180 tablet 1  ? losartan (COZAAR) 100 MG tablet Take 100 mg by mouth daily.    ? multivitamin-lutein (OCUVITE-LUTEIN) CAPS capsule Take 1 capsule by mouth daily.    ? Polyethyl Glycol-Propyl Glycol  0.4-0.3 % SOLN Apply 1 drop to eye 3 (three) times daily as needed (dry eyes).     ? SIMBRINZA 1-0.2 % SUSP INSTILL 1 DROP INTO EACH EYE TWICE DAILY    ? STIOLTO RESPIMAT 2.5-2.5 MCG/ACT AERS INHALE 2 PUFFS BY MOUTH ONCE DAILY 4 g 0  ? triamcinolone cream (KENALOG) 0.1 % Apply 1 application topically 2 (two) times daily as needed (dermatitis).     ? XARELTO 20 MG TABS tablet TAKE 1 TABLET BY MOUTH ONCE DAILY WITH SUPPER 90 tablet 0  ? ?No current facility-administered medications for this visit.  ? ? ?PHYSICAL EXAMINATION: ?ECOG PERFORMANCE STATUS: 1 - Symptomatic but completely ambulatory ? ?Vitals:  ? 12/20/21 1459  ?BP: (!) 149/58  ?Pulse: 77  ?Resp: 18  ?Temp: (!) 97.3 ?F (36.3 ?C)  ?SpO2: 100%  ? ?Filed Weights  ? 12/20/21 1459  ?Weight: 231 lb 9.6 oz (105.1 kg)  ? ? ?LABORATORY DATA:  ?I have reviewed the data as listed ?CMP Latest Ref Rng & Units 11/23/2021 04/20/2019 12/04/2018  ?Glucose 70 - 99 mg/dL 165(H) 96 104(H)  ?BUN 8 - 23 mg/dL '20 14 16  '$ ?Creatinine 0.44 - 1.00 mg/dL 0.91 0.86 0.78  ?Sodium 135 - 145 mmol/L 135 140 139  ?Potassium 3.5 - 5.1 mmol/L 3.5 3.6 3.3(L)  ?Chloride 98 - 111 mmol/L 104 106 107  ?CO2  22 - 32 mmol/L '24 25 24  '$ ?Calcium 8.9 - 10.3 mg/dL 9.4 9.2 9.3  ?Total Protein 6.5 - 8.1 g/dL 7.3 - -  ?Total Bilirubin 0.3 - 1.2 mg/dL 0.5 - -  ?Alkaline Phos 38 - 126 U/L 95 - -  ?AST 15 - 41 U/L 17 - -  ?ALT 0 - 44 U/L 18 - -  ? ? ?Lab Results  ?Component Value Date  ? WBC 7.8 11/23/2021  ? HGB 12.2 11/23/2021  ? HCT 38.1 11/23/2021  ? MCV 85.4 11/23/2021  ? PLT 221 11/23/2021  ? NEUTROABS 6.3 11/23/2021  ? ? ?ASSESSMENT & PLAN:  ?Acute deep vein thrombosis (DVT) of right popliteal vein ([facility]) ?12/03/2018: Positive for acute pulmonary embolism with right heart strain ?12/04/2018: DVT of right popliteal vein and right gastrocnemius vein ?Treatment: Xarelto since February 2020 ?CT chest angiogram 04/20/2019: Near total resolution of previous bilateral PE no evidence of persistent or recurrent PE ?  ?Lupus  anticoagulant testing 07/14/2019: Positive ?  ?Recommendation:  lifetime Xarelto: Because of positive lupus anticoagulant.  (Even though Coumadin may be ideal for lupus anticoagulant, because of convenience we decided to remain on Xarelto) ? ?Toxicities to Xarelto: None ?Shortness of breath: Improved ?Leg swelling: Better ?  ?RTC in 1 year to follow up ?No orders of the defined types were placed in this encounter. ? ?The patient has a good understanding of the overall plan. she agrees with it. she will call with any problems that may develop before the next visit here. ? ?Total time spent: 20 mins including face to face time and time spent for planning, charting and coordination of care ? ?Rulon Eisenmenger, MD, MPH ?12/20/2021 ? ?I, Thana Ates, am acting as scribe for Dr. Nicholas Lose. ? ?I have reviewed the above documentation for accuracy and completeness, and I agree with the above. ? ? ? ? ? ? ?

## 2021-12-20 ENCOUNTER — Other Ambulatory Visit: Payer: Self-pay

## 2021-12-20 ENCOUNTER — Inpatient Hospital Stay: Payer: Medicare Other | Attending: Hematology and Oncology | Admitting: Hematology and Oncology

## 2021-12-20 DIAGNOSIS — Z86711 Personal history of pulmonary embolism: Secondary | ICD-10-CM | POA: Diagnosis not present

## 2021-12-20 DIAGNOSIS — Z7901 Long term (current) use of anticoagulants: Secondary | ICD-10-CM | POA: Insufficient documentation

## 2021-12-20 DIAGNOSIS — D6862 Lupus anticoagulant syndrome: Secondary | ICD-10-CM | POA: Diagnosis not present

## 2021-12-20 DIAGNOSIS — Z86718 Personal history of other venous thrombosis and embolism: Secondary | ICD-10-CM | POA: Insufficient documentation

## 2021-12-20 DIAGNOSIS — I82431 Acute embolism and thrombosis of right popliteal vein: Secondary | ICD-10-CM

## 2021-12-20 MED ORDER — RIVAROXABAN 20 MG PO TABS: 20.0000 mg | ORAL_TABLET | Freq: Every day | ORAL | 11 refills | Status: DC

## 2021-12-20 NOTE — Assessment & Plan Note (Signed)
12/03/2018: Positive for acute pulmonary embolism with right heart strain ?12/04/2018: DVT of right popliteal vein and right gastrocnemius vein ?Treatment: Xarelto since February 2020 ?CT chest angiogram 04/20/2019: Near total resolution of previous bilateral PE no evidence of persistent or recurrent PE ?? ?Lupus anticoagulant testing 07/14/2019: Positive ?? ?Recommendation: ?lifetime Xarelto: Because of positive lupus anticoagulant.  (Even though Coumadin may be ideal for lupus anticoagulant, because of convenience we decided to remain on Xarelto) ? ?Toxicities to Xarelto: None ?Shortness of breath: Improved ?Leg swelling: Better ?? ? ? ?

## 2022-01-08 DIAGNOSIS — Z20822 Contact with and (suspected) exposure to covid-19: Secondary | ICD-10-CM | POA: Diagnosis not present

## 2022-01-10 ENCOUNTER — Other Ambulatory Visit: Payer: Self-pay | Admitting: Emergency Medicine

## 2022-02-07 DIAGNOSIS — Z20822 Contact with and (suspected) exposure to covid-19: Secondary | ICD-10-CM | POA: Diagnosis not present

## 2022-02-27 DIAGNOSIS — D6869 Other thrombophilia: Secondary | ICD-10-CM | POA: Diagnosis not present

## 2022-02-27 DIAGNOSIS — I700 Atherosclerosis of aorta: Secondary | ICD-10-CM | POA: Diagnosis not present

## 2022-02-27 DIAGNOSIS — I10 Essential (primary) hypertension: Secondary | ICD-10-CM | POA: Diagnosis not present

## 2022-02-27 DIAGNOSIS — E782 Mixed hyperlipidemia: Secondary | ICD-10-CM | POA: Diagnosis not present

## 2022-02-27 DIAGNOSIS — R7303 Prediabetes: Secondary | ICD-10-CM | POA: Diagnosis not present

## 2022-03-01 ENCOUNTER — Encounter: Payer: Self-pay | Admitting: Emergency Medicine

## 2022-03-01 ENCOUNTER — Ambulatory Visit (INDEPENDENT_AMBULATORY_CARE_PROVIDER_SITE_OTHER): Payer: Medicare Other | Admitting: Emergency Medicine

## 2022-03-01 DIAGNOSIS — I2699 Other pulmonary embolism without acute cor pulmonale: Secondary | ICD-10-CM | POA: Diagnosis not present

## 2022-03-01 DIAGNOSIS — R0602 Shortness of breath: Secondary | ICD-10-CM

## 2022-03-01 DIAGNOSIS — J449 Chronic obstructive pulmonary disease, unspecified: Secondary | ICD-10-CM

## 2022-03-01 MED ORDER — ALBUTEROL SULFATE HFA 108 (90 BASE) MCG/ACT IN AERS: 2.0000 | INHALATION_SPRAY | Freq: Four times a day (QID) | RESPIRATORY_TRACT | 5 refills | Status: DC | PRN

## 2022-03-01 NOTE — Addendum Note (Signed)
Addended by: Gavin Potters R on: 03/01/2022 12:16 PM   Modules accepted: Orders

## 2022-03-01 NOTE — Assessment & Plan Note (Signed)
Continue Stiolto, albuterol if needed

## 2022-03-01 NOTE — Progress Notes (Signed)
   Subjective:    Patient ID: Kaitlin Arnold, female    DOB: May 18, 1936, 86 y.o.   MRN: [id number]   HPI  ROV 08/31/21 --this follow-up visit for 86 year old woman with multifactorial shortness of breath.  She has chronic thromboembolic disease (on lifelong anticoagulation), probable COPD and a component of restrictive disease with associated secondary PAH.  She is on Xarelto, has a lupus anticoagulant.  She may benefit from [employer].  She reports that she believes that her breathing is better than the last time I saw her. She has albuterol, uses it rarely, but does use it if she goes out shopping. No wheeze, mucous, or cough.  We had talked about possibly going to pulmonary rehab. No desat after 1 lap at her last visit, stopped due to ankle pain.   ROV 03/01/22 --86 year old woman with multifactorial dyspnea due to chronic thromboembolic disease in the setting of lupus anticoagulant (on Xarelto), probable COPD, probable restrictive disease, associated secondary PAH.  Have been managing COPD with Stiolto.  Today she reports that she has stable exertional SOB, trouble climbing stairs. She has done some yardwork recently - did OK. Wt has been stable. Uses albuterol few times a week. Does get benefit.    Review of Systems As per HPI     Objective:   Physical Exam Vitals:   03/01/22 1111  BP: 128/76  Pulse: 72  Temp: 98.1 F (36.7 C)  TempSrc: Oral  SpO2: 99%  Weight: 229 lb 6.4 oz (104.1 kg)  Height: '5\' 5"'$  (1.651 m)   Gen: Pleasant, overweight woman, in no distress,  normal affect  ENT: No lesions,  mouth clear,  oropharynx clear, no postnasal drip  Neck: No JVD, no stridor  Lungs: No use of accessory muscles, distant, soft insp crackles R base  Cardiovascular: RRR, holosystolic murmur 3/6 with an intact S2  Musculoskeletal: No deformities, no cyanosis or clubbing  Neuro: alert, awake, non focal  Skin: Warm, no lesions or rash     Assessment & Plan:  SOB (shortness of  breath) Multifactorial dyspnea: Chronic VTE, restrictive disease from obesity and deconditioning, COPD (treated).  Need to rule out occult hypoxemia.  We will repeat her walking oximetry today.  If she desaturates then we should consider supplemental oxygen and also rechecking her echocardiogram to evaluate for any progression of pulmonary hypertension.  COPD (chronic obstructive pulmonary disease) (HCC) Continue Stiolto, albuterol if needed  Bilateral pulmonary embolism (HCC) Chronic anticoagulation in the setting of lupus anticoagulant  Baltazar Apo, MD, PhD 03/01/2022, 11:50 AM [facility] [phone] or if no answer [phone]

## 2022-03-01 NOTE — Assessment & Plan Note (Signed)
Chronic anticoagulation in the setting of lupus anticoagulant

## 2022-03-01 NOTE — Patient Instructions (Signed)
Please continue your Stiolto 2 puffs once daily as you have been taking it. Keep your albuterol available to use 2 puffs if needed for shortness of breath, chest tightness, wheezing. Continue your Xarelto as you have been taking it Walking oximetry today on room air Follow with Dr. Lamonte Sakai in 12 months or sooner if you have any problems.

## 2022-03-01 NOTE — Assessment & Plan Note (Signed)
Multifactorial dyspnea: Chronic VTE, restrictive disease from obesity and deconditioning, COPD (treated).  Need to rule out occult hypoxemia.  We will repeat her walking oximetry today.  If she desaturates then we should consider supplemental oxygen and also rechecking her echocardiogram to evaluate for any progression of pulmonary hypertension.

## 2022-03-13 ENCOUNTER — Other Ambulatory Visit: Payer: Self-pay | Admitting: Urology

## 2022-03-13 DIAGNOSIS — D49512 Neoplasm of unspecified behavior of left kidney: Secondary | ICD-10-CM

## 2022-03-14 DIAGNOSIS — I700 Atherosclerosis of aorta: Secondary | ICD-10-CM | POA: Diagnosis not present

## 2022-03-14 DIAGNOSIS — M25511 Pain in right shoulder: Secondary | ICD-10-CM | POA: Diagnosis not present

## 2022-03-14 DIAGNOSIS — R7303 Prediabetes: Secondary | ICD-10-CM | POA: Diagnosis not present

## 2022-03-14 DIAGNOSIS — D6869 Other thrombophilia: Secondary | ICD-10-CM | POA: Diagnosis not present

## 2022-03-14 DIAGNOSIS — G609 Hereditary and idiopathic neuropathy, unspecified: Secondary | ICD-10-CM | POA: Diagnosis not present

## 2022-03-14 DIAGNOSIS — I872 Venous insufficiency (chronic) (peripheral): Secondary | ICD-10-CM | POA: Diagnosis not present

## 2022-03-14 DIAGNOSIS — M25512 Pain in left shoulder: Secondary | ICD-10-CM | POA: Diagnosis not present

## 2022-03-14 DIAGNOSIS — M150 Primary generalized (osteo)arthritis: Secondary | ICD-10-CM | POA: Diagnosis not present

## 2022-03-14 DIAGNOSIS — E782 Mixed hyperlipidemia: Secondary | ICD-10-CM | POA: Diagnosis not present

## 2022-03-14 DIAGNOSIS — I10 Essential (primary) hypertension: Secondary | ICD-10-CM | POA: Diagnosis not present

## 2022-03-14 DIAGNOSIS — Z7901 Long term (current) use of anticoagulants: Secondary | ICD-10-CM | POA: Diagnosis not present

## 2022-03-14 DIAGNOSIS — N182 Chronic kidney disease, stage 2 (mild): Secondary | ICD-10-CM | POA: Diagnosis not present

## 2022-03-27 DIAGNOSIS — I872 Venous insufficiency (chronic) (peripheral): Secondary | ICD-10-CM | POA: Diagnosis not present

## 2022-03-27 DIAGNOSIS — I10 Essential (primary) hypertension: Secondary | ICD-10-CM | POA: Diagnosis not present

## 2022-03-27 DIAGNOSIS — R7303 Prediabetes: Secondary | ICD-10-CM | POA: Diagnosis not present

## 2022-03-27 DIAGNOSIS — Z7901 Long term (current) use of anticoagulants: Secondary | ICD-10-CM | POA: Diagnosis not present

## 2022-03-27 DIAGNOSIS — M25512 Pain in left shoulder: Secondary | ICD-10-CM | POA: Diagnosis not present

## 2022-03-27 DIAGNOSIS — N182 Chronic kidney disease, stage 2 (mild): Secondary | ICD-10-CM | POA: Diagnosis not present

## 2022-03-27 DIAGNOSIS — D6869 Other thrombophilia: Secondary | ICD-10-CM | POA: Diagnosis not present

## 2022-03-27 DIAGNOSIS — G609 Hereditary and idiopathic neuropathy, unspecified: Secondary | ICD-10-CM | POA: Diagnosis not present

## 2022-03-27 DIAGNOSIS — E782 Mixed hyperlipidemia: Secondary | ICD-10-CM | POA: Diagnosis not present

## 2022-03-27 DIAGNOSIS — I700 Atherosclerosis of aorta: Secondary | ICD-10-CM | POA: Diagnosis not present

## 2022-03-27 DIAGNOSIS — M25511 Pain in right shoulder: Secondary | ICD-10-CM | POA: Diagnosis not present

## 2022-03-27 DIAGNOSIS — M150 Primary generalized (osteo)arthritis: Secondary | ICD-10-CM | POA: Diagnosis not present

## 2022-04-12 DIAGNOSIS — H401111 Primary open-angle glaucoma, right eye, mild stage: Secondary | ICD-10-CM | POA: Diagnosis not present

## 2022-04-12 DIAGNOSIS — H0102A Squamous blepharitis right eye, upper and lower eyelids: Secondary | ICD-10-CM | POA: Diagnosis not present

## 2022-04-12 DIAGNOSIS — E119 Type 2 diabetes mellitus without complications: Secondary | ICD-10-CM | POA: Diagnosis not present

## 2022-04-12 DIAGNOSIS — H0102B Squamous blepharitis left eye, upper and lower eyelids: Secondary | ICD-10-CM | POA: Diagnosis not present

## 2022-04-12 DIAGNOSIS — H16223 Keratoconjunctivitis sicca, not specified as Sjogren's, bilateral: Secondary | ICD-10-CM | POA: Diagnosis not present

## 2022-04-12 DIAGNOSIS — H401122 Primary open-angle glaucoma, left eye, moderate stage: Secondary | ICD-10-CM | POA: Diagnosis not present

## 2022-04-12 DIAGNOSIS — Z961 Presence of intraocular lens: Secondary | ICD-10-CM | POA: Diagnosis not present

## 2022-05-20 ENCOUNTER — Ambulatory Visit
Admission: RE | Admit: 2022-05-20 | Discharge: 2022-05-20 | Disposition: A | Discharge status: Home / Self Care | Payer: Medicare Other | Source: Ambulatory Visit | Attending: Urology | Admitting: Urology

## 2022-05-20 DIAGNOSIS — C649 Malignant neoplasm of unspecified kidney, except renal pelvis: Secondary | ICD-10-CM | POA: Diagnosis not present

## 2022-05-20 DIAGNOSIS — D49512 Neoplasm of unspecified behavior of left kidney: Secondary | ICD-10-CM

## 2022-05-20 DIAGNOSIS — K5730 Diverticulosis of large intestine without perforation or abscess without bleeding: Secondary | ICD-10-CM | POA: Diagnosis not present

## 2022-05-20 DIAGNOSIS — N281 Cyst of kidney, acquired: Secondary | ICD-10-CM | POA: Diagnosis not present

## 2022-05-20 DIAGNOSIS — N2889 Other specified disorders of kidney and ureter: Secondary | ICD-10-CM | POA: Diagnosis not present

## 2022-05-20 MED ORDER — GADOBENATE DIMEGLUMINE 529 MG/ML IV SOLN
20.0000 mL | Freq: Once | INTRAVENOUS | Status: AC | PRN
  Administered 2022-05-20: 20 mL via INTRAVENOUS

## 2022-05-28 DIAGNOSIS — D49512 Neoplasm of unspecified behavior of left kidney: Secondary | ICD-10-CM | POA: Diagnosis not present

## 2022-06-04 DIAGNOSIS — N281 Cyst of kidney, acquired: Secondary | ICD-10-CM | POA: Diagnosis not present

## 2022-06-04 DIAGNOSIS — D49512 Neoplasm of unspecified behavior of left kidney: Secondary | ICD-10-CM | POA: Diagnosis not present

## 2022-07-06 IMAGING — CT CT HEAD W/O CM
3 series · 15 of 47 positions shown, 18 images · non-contrast
Comparison: Head CT 08/28/2002

CLINICAL DATA: Dizziness, persistent/recurrent, cardiac or vascular
cause suspected



[Series 2: head wo · axial · 0.42mm/px · z∈[-165,-35]mm · 9 of 32 slices shown, 12 images]
[im 3/32  brain]
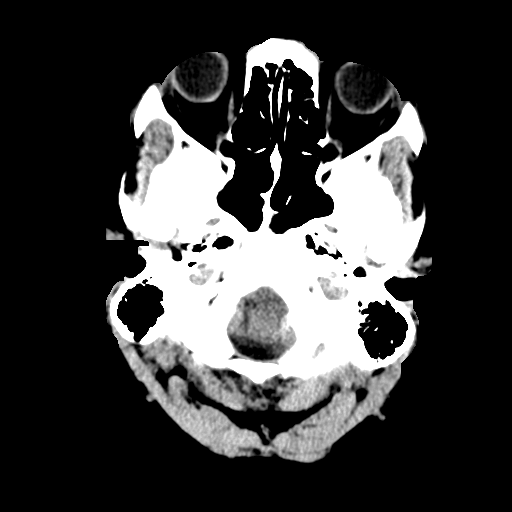
[im 3/32  bone]
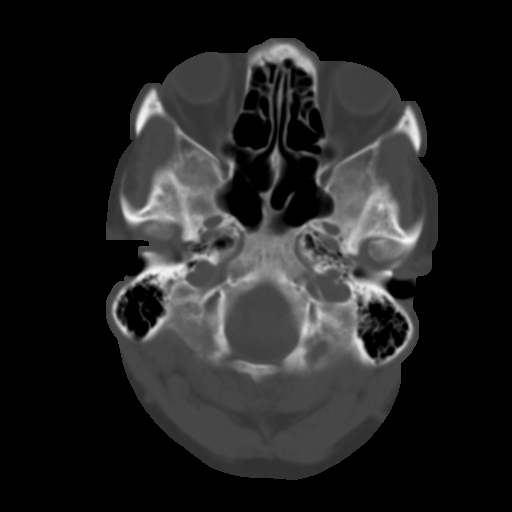
[im 6/32  brain]
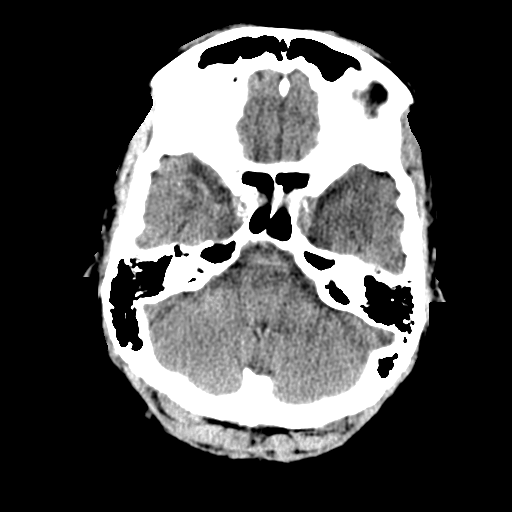
[im 9/32  brain]
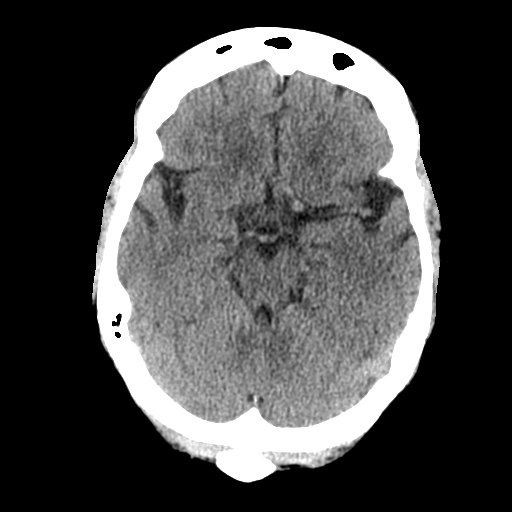
[im 12/32  brain]
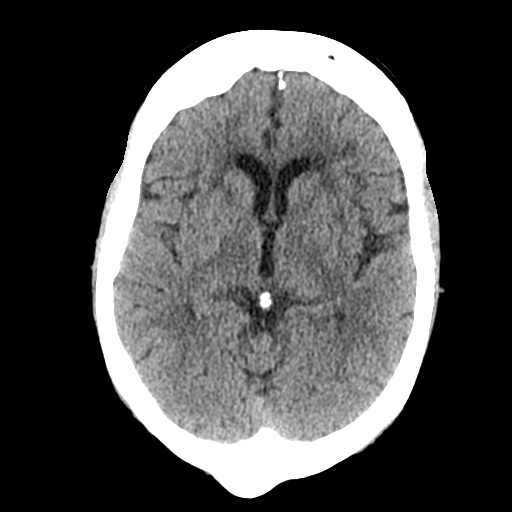
[im 17/32  brain]
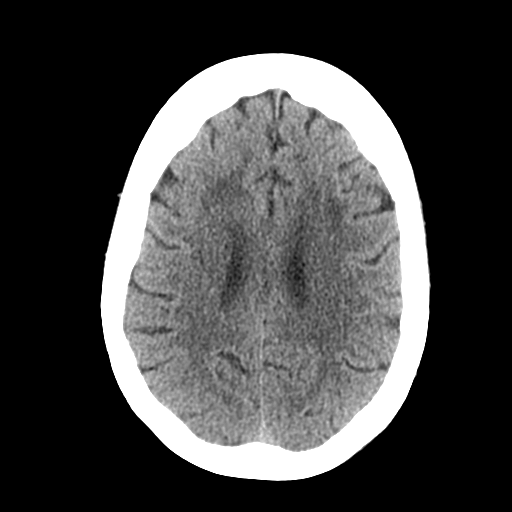
[im 17/32  bone]
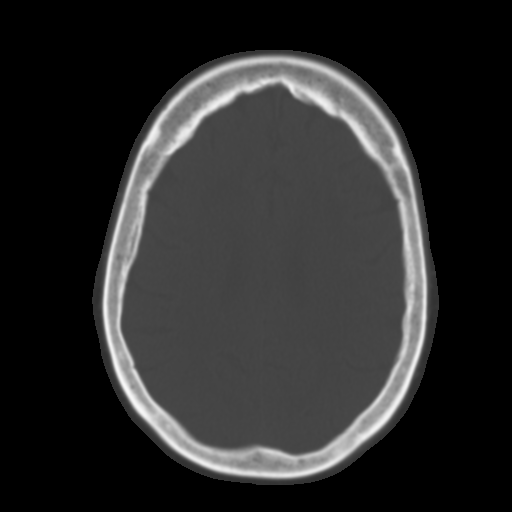
[im 20/32  brain]
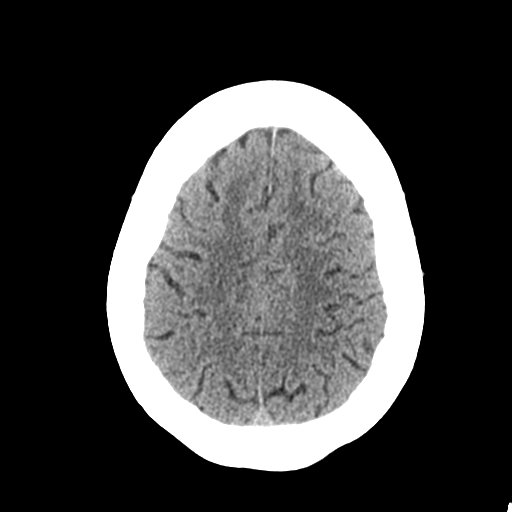
[im 23/32  brain]
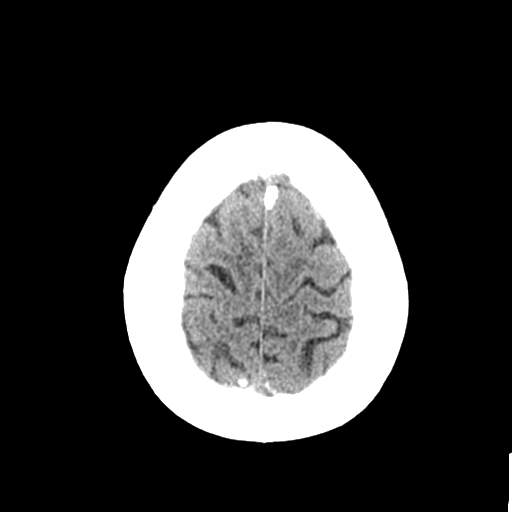
[im 26/32  brain]
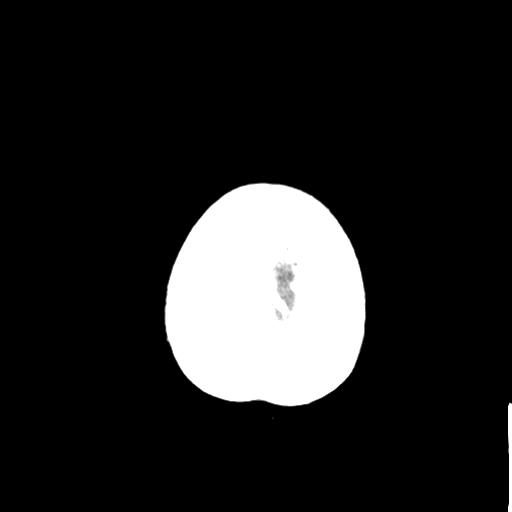
[im 29/32  brain]
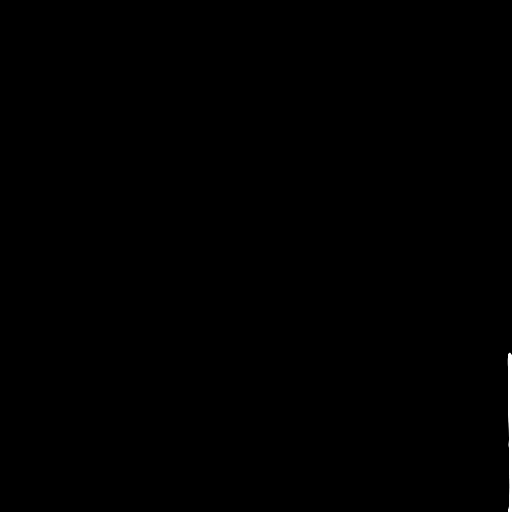
[im 29/32  bone]
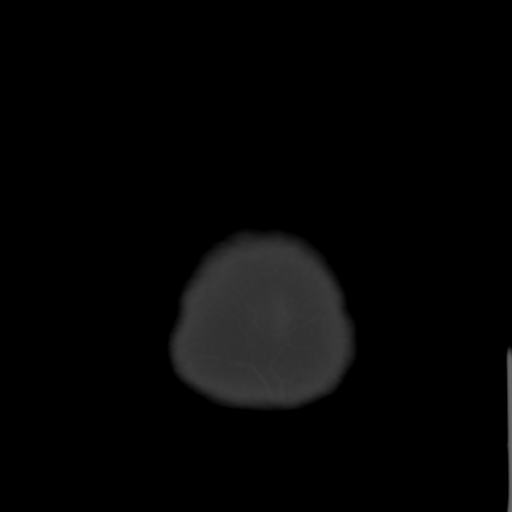

[Series 5: coronal soft tissue · coronal · 0.30mm/px · 3 of 66 slices shown]
[im 22/66  brain]
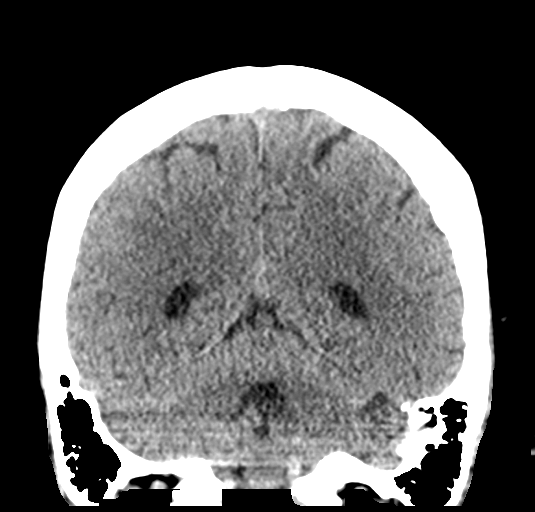
[im 29/66  brain]
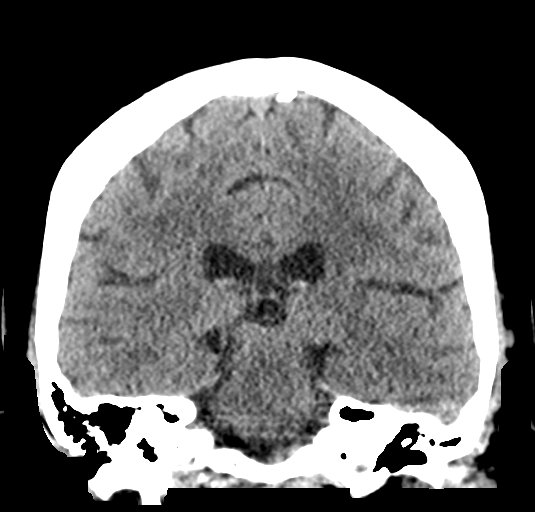
[im 37/66  brain]
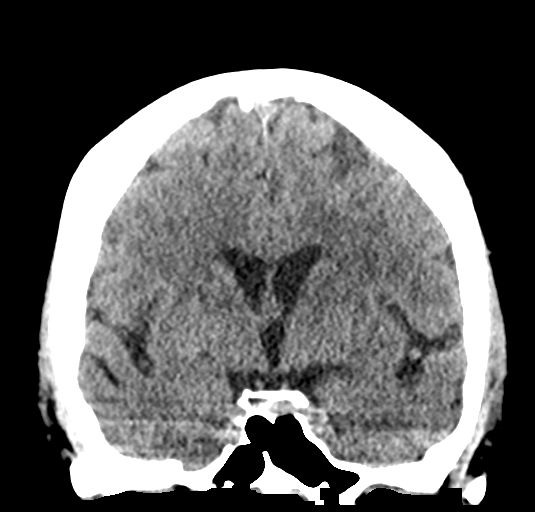

[Series 6: sagittal soft tissue · sagittal · 0.30mm/px · 3 of 55 slices shown]
[im 19/55  brain]
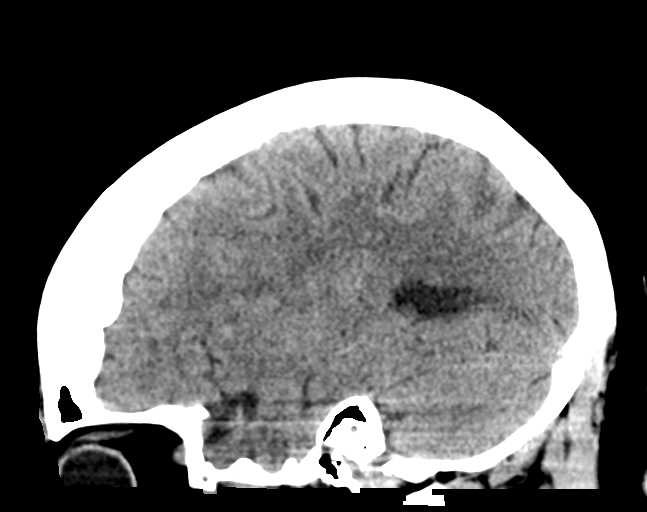
[im 28/55  brain]
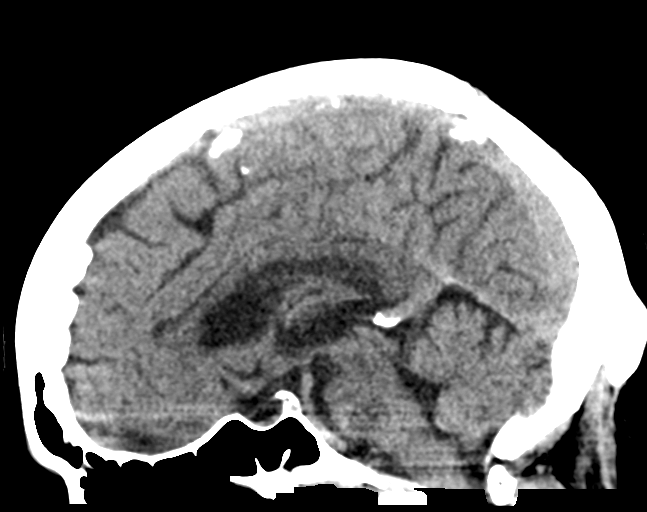
[im 37/55  brain]
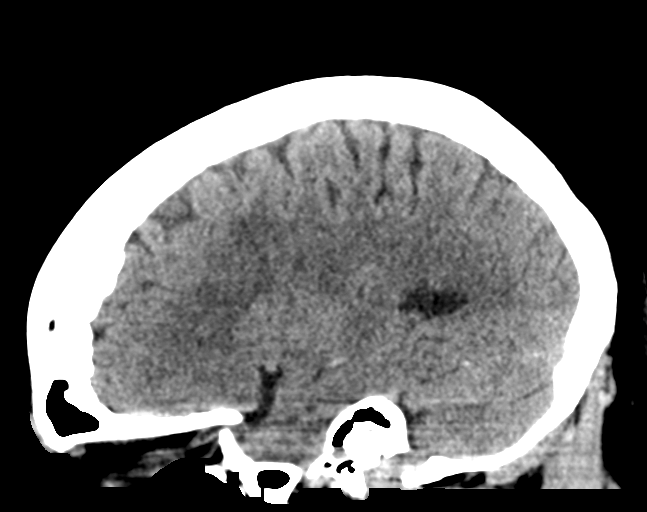

[15 of 47 positions shown; findings below may reference images not displayed]

FINDINGS: Brain: No evidence of acute intracranial hemorrhage or extra-axial
collection.No evidence of mass lesion/concern mass effect.The
ventricles are normal in size.Scattered subcortical and
periventricular white matter hypodensities, nonspecific but likely
sequela of chronic small vessel ischemic disease.

Vascular: No hyperdense vessel or unexpected calcification.

Skull: Hyperostosis frontalis internus. Prominent bony protuberance
off of the posterior aspect of the occipital bone with nonaggressive
features.

Sinuses/Orbits: No acute finding.

Other: None.
IMPRESSION: No acute intracranial abnormality. Sequelae of chronic small vessel
ischemic disease.

## 2022-08-12 ENCOUNTER — Other Ambulatory Visit: Payer: Self-pay | Admitting: Emergency Medicine

## 2022-08-14 DIAGNOSIS — H0102A Squamous blepharitis right eye, upper and lower eyelids: Secondary | ICD-10-CM | POA: Diagnosis not present

## 2022-08-14 DIAGNOSIS — H35033 Hypertensive retinopathy, bilateral: Secondary | ICD-10-CM | POA: Diagnosis not present

## 2022-08-14 DIAGNOSIS — H0102B Squamous blepharitis left eye, upper and lower eyelids: Secondary | ICD-10-CM | POA: Diagnosis not present

## 2022-08-14 DIAGNOSIS — H401111 Primary open-angle glaucoma, right eye, mild stage: Secondary | ICD-10-CM | POA: Diagnosis not present

## 2022-08-14 DIAGNOSIS — H401122 Primary open-angle glaucoma, left eye, moderate stage: Secondary | ICD-10-CM | POA: Diagnosis not present

## 2022-08-14 DIAGNOSIS — E119 Type 2 diabetes mellitus without complications: Secondary | ICD-10-CM | POA: Diagnosis not present

## 2022-08-14 DIAGNOSIS — Z961 Presence of intraocular lens: Secondary | ICD-10-CM | POA: Diagnosis not present

## 2022-08-14 DIAGNOSIS — H353131 Nonexudative age-related macular degeneration, bilateral, early dry stage: Secondary | ICD-10-CM | POA: Diagnosis not present

## 2022-08-14 DIAGNOSIS — H16223 Keratoconjunctivitis sicca, not specified as Sjogren's, bilateral: Secondary | ICD-10-CM | POA: Diagnosis not present

## 2022-09-04 DIAGNOSIS — I700 Atherosclerosis of aorta: Secondary | ICD-10-CM | POA: Diagnosis not present

## 2022-09-04 DIAGNOSIS — G609 Hereditary and idiopathic neuropathy, unspecified: Secondary | ICD-10-CM | POA: Diagnosis not present

## 2022-09-04 DIAGNOSIS — D6869 Other thrombophilia: Secondary | ICD-10-CM | POA: Diagnosis not present

## 2022-09-04 DIAGNOSIS — M150 Primary generalized (osteo)arthritis: Secondary | ICD-10-CM | POA: Diagnosis not present

## 2022-09-04 DIAGNOSIS — R7303 Prediabetes: Secondary | ICD-10-CM | POA: Diagnosis not present

## 2022-09-04 DIAGNOSIS — Z7901 Long term (current) use of anticoagulants: Secondary | ICD-10-CM | POA: Diagnosis not present

## 2022-09-04 DIAGNOSIS — N182 Chronic kidney disease, stage 2 (mild): Secondary | ICD-10-CM | POA: Diagnosis not present

## 2022-09-04 DIAGNOSIS — E782 Mixed hyperlipidemia: Secondary | ICD-10-CM | POA: Diagnosis not present

## 2022-09-04 DIAGNOSIS — I10 Essential (primary) hypertension: Secondary | ICD-10-CM | POA: Diagnosis not present

## 2022-09-04 DIAGNOSIS — I872 Venous insufficiency (chronic) (peripheral): Secondary | ICD-10-CM | POA: Diagnosis not present

## 2022-09-11 DIAGNOSIS — N182 Chronic kidney disease, stage 2 (mild): Secondary | ICD-10-CM | POA: Diagnosis not present

## 2022-09-11 DIAGNOSIS — E782 Mixed hyperlipidemia: Secondary | ICD-10-CM | POA: Diagnosis not present

## 2022-09-11 DIAGNOSIS — I872 Venous insufficiency (chronic) (peripheral): Secondary | ICD-10-CM | POA: Diagnosis not present

## 2022-09-11 DIAGNOSIS — D692 Other nonthrombocytopenic purpura: Secondary | ICD-10-CM | POA: Diagnosis not present

## 2022-09-11 DIAGNOSIS — Z23 Encounter for immunization: Secondary | ICD-10-CM | POA: Diagnosis not present

## 2022-09-11 DIAGNOSIS — D6869 Other thrombophilia: Secondary | ICD-10-CM | POA: Diagnosis not present

## 2022-09-11 DIAGNOSIS — G609 Hereditary and idiopathic neuropathy, unspecified: Secondary | ICD-10-CM | POA: Diagnosis not present

## 2022-09-11 DIAGNOSIS — Z0000 Encounter for general adult medical examination without abnormal findings: Secondary | ICD-10-CM | POA: Diagnosis not present

## 2022-09-11 DIAGNOSIS — M25551 Pain in right hip: Secondary | ICD-10-CM | POA: Diagnosis not present

## 2022-09-11 DIAGNOSIS — M150 Primary generalized (osteo)arthritis: Secondary | ICD-10-CM | POA: Diagnosis not present

## 2022-09-11 DIAGNOSIS — R7303 Prediabetes: Secondary | ICD-10-CM | POA: Diagnosis not present

## 2022-09-11 DIAGNOSIS — I700 Atherosclerosis of aorta: Secondary | ICD-10-CM | POA: Diagnosis not present

## 2022-09-25 ENCOUNTER — Other Ambulatory Visit: Payer: Self-pay | Admitting: Emergency Medicine

## 2022-10-04 DIAGNOSIS — M1612 Unilateral primary osteoarthritis, left hip: Secondary | ICD-10-CM | POA: Diagnosis not present

## 2022-10-04 DIAGNOSIS — M7061 Trochanteric bursitis, right hip: Secondary | ICD-10-CM | POA: Diagnosis not present

## 2022-10-04 DIAGNOSIS — M25551 Pain in right hip: Secondary | ICD-10-CM | POA: Diagnosis not present

## 2022-10-04 DIAGNOSIS — M25552 Pain in left hip: Secondary | ICD-10-CM | POA: Diagnosis not present

## 2022-10-18 DIAGNOSIS — Z1231 Encounter for screening mammogram for malignant neoplasm of breast: Secondary | ICD-10-CM | POA: Diagnosis not present

## 2022-10-23 DIAGNOSIS — M25552 Pain in left hip: Secondary | ICD-10-CM | POA: Diagnosis not present

## 2022-10-23 DIAGNOSIS — M25551 Pain in right hip: Secondary | ICD-10-CM | POA: Diagnosis not present

## 2022-11-12 DIAGNOSIS — Z23 Encounter for immunization: Secondary | ICD-10-CM | POA: Diagnosis not present

## 2022-12-16 ENCOUNTER — Other Ambulatory Visit: Payer: Self-pay | Admitting: Hematology and Oncology

## 2022-12-16 DIAGNOSIS — H353131 Nonexudative age-related macular degeneration, bilateral, early dry stage: Secondary | ICD-10-CM | POA: Diagnosis not present

## 2022-12-16 DIAGNOSIS — H401111 Primary open-angle glaucoma, right eye, mild stage: Secondary | ICD-10-CM | POA: Diagnosis not present

## 2022-12-16 DIAGNOSIS — H0102A Squamous blepharitis right eye, upper and lower eyelids: Secondary | ICD-10-CM | POA: Diagnosis not present

## 2022-12-16 DIAGNOSIS — H0102B Squamous blepharitis left eye, upper and lower eyelids: Secondary | ICD-10-CM | POA: Diagnosis not present

## 2022-12-16 DIAGNOSIS — H35033 Hypertensive retinopathy, bilateral: Secondary | ICD-10-CM | POA: Diagnosis not present

## 2022-12-16 DIAGNOSIS — E119 Type 2 diabetes mellitus without complications: Secondary | ICD-10-CM | POA: Diagnosis not present

## 2022-12-16 DIAGNOSIS — H401122 Primary open-angle glaucoma, left eye, moderate stage: Secondary | ICD-10-CM | POA: Diagnosis not present

## 2022-12-16 DIAGNOSIS — Z961 Presence of intraocular lens: Secondary | ICD-10-CM | POA: Diagnosis not present

## 2022-12-18 DIAGNOSIS — M1611 Unilateral primary osteoarthritis, right hip: Secondary | ICD-10-CM | POA: Diagnosis not present

## 2022-12-18 DIAGNOSIS — M25552 Pain in left hip: Secondary | ICD-10-CM | POA: Diagnosis not present

## 2022-12-25 ENCOUNTER — Telehealth: Payer: Self-pay | Admitting: Hematology and Oncology

## 2022-12-25 NOTE — Telephone Encounter (Signed)
Scheduled appointment per 3/4 staff message. Left voicemail.

## 2022-12-27 ENCOUNTER — Telehealth: Payer: Self-pay | Admitting: Hematology and Oncology

## 2022-12-27 ENCOUNTER — Other Ambulatory Visit: Payer: Self-pay | Admitting: *Deleted

## 2022-12-27 MED ORDER — RIVAROXABAN 20 MG PO TABS: 20.0000 mg | ORAL_TABLET | Freq: Every day | ORAL | 2 refills | Status: DC

## 2022-12-27 NOTE — Telephone Encounter (Signed)
Per 3/15 IB reached out to patient to reschedule appointment, patient having hip surgery the week before original appointment. Patient aware of time and date change.

## 2022-12-30 ENCOUNTER — Other Ambulatory Visit: Payer: Self-pay | Admitting: Cardiology

## 2022-12-30 DIAGNOSIS — I10 Essential (primary) hypertension: Secondary | ICD-10-CM

## 2023-01-06 NOTE — Patient Instructions (Signed)
SURGICAL WAITING ROOM VISITATION Patients having surgery or a procedure may have no more than 2 support people in the waiting area - these visitors may rotate in the visitor waiting room.   Due to an increase in RSV and influenza rates and associated hospitalizations, children ages 10 and under may not visit patients in [place]. If the patient needs to stay at the hospital during part of their recovery, the visitor guidelines for inpatient rooms apply.  PRE-OP VISITATION  Pre-op nurse will coordinate an appropriate time for 1 support person to accompany the patient in pre-op.  This support person may not rotate.  This visitor will be contacted when the time is appropriate for the visitor to come back in the pre-op area.  Please refer to the [facility] website for the visitor guidelines for Inpatients (after your surgery is over and you are in a regular room).  You are not required to quarantine at this time prior to your surgery. However, you must do this: Hand Hygiene often Do NOT share personal items Notify your provider if you are in close contact with someone who has COVID or you develop fever 100.4 or greater, new onset of sneezing, cough, sore throat, shortness of breath or body aches.  If you test positive for Covid or have been in contact with anyone that has tested positive in the last 10 days please notify you surgeon.    Your procedure is scheduled on: Tuesday  January 21, 2023   Report to [facility] Main Entrance: [place] entrance where the [employer] is available.   Report to admitting at: 11:45    AM  +++++Call this number if you have any questions or problems the morning of surgery [phone]  Do not eat food after Midnight the night prior to your surgery/procedure.  After Midnight you may have the following liquids until   11:15 AM DAY OF SURGERY  Clear Liquid Diet Water Black Coffee (sugar ok, NO MILK/CREAM OR CREAMERS)  Tea (sugar ok, NO  MILK/CREAM OR CREAMERS) regular and decaf                             Plain Jell-O  with no fruit (NO RED)                                           Fruit ices (not with fruit pulp, NO RED)                                     Popsicles (NO RED)                                                                  Juice: apple, WHITE grape, WHITE cranberry Sports drinks like Gatorade or Powerade (NO RED)                    The day of surgery:  Drink ONE (1) Pre-Surgery Clear Ensure at  11:15 AM the morning of surgery. Drink in one  sitting. Do not sip.  This drink was given to you during your hospital pre-op appointment visit. Nothing else to drink after completing the Pre-Surgery Clear Ensure : No candy, chewing gum or throat lozenges.    FOLLOW ADDITIONAL PRE OP INSTRUCTIONS YOU RECEIVED FROM YOUR SURGEON'S OFFICE!!!   Oral Hygiene is also important to reduce your risk of infection.        Remember - BRUSH YOUR TEETH THE MORNING OF SURGERY WITH YOUR REGULAR TOOTHPASTE  XARELTO- Hold for 3 days prior to surgery per Dr. Alvan Dame.  Last dose: Friday  01-17-23  Take ONLY these medicines the morning of surgery with A SIP OF WATER: amlodipine, labetolol,    If needed, you may use your inhalers and you may take Tylenol.                    You may not have any metal on your body including hair pins, jewelry, and body piercing  Do not wear make-up, lotions, powders, perfumes or deodorant  Do not wear nail polish including gel and S&S, artificial / acrylic nails, or any other type of covering on natural nails including finger and toenails. If you have artificial nails, gel coating, etc., that needs to be removed by a nail salon, Please have this removed prior to surgery. Not doing so may mean that your surgery could be cancelled or delayed if the Surgeon or anesthesia staff feels like they are unable to monitor you safely.   Do not shave 48 hours prior to surgery to avoid nicks in your skin which may  contribute to postoperative infections.   Contacts, Hearing Aids, dentures or bridgework may not be worn into surgery. DENTURES WILL BE REMOVED PRIOR TO SURGERY PLEASE DO NOT APPLY "Poly grip" OR ADHESIVES!!!  You may bring a small overnight bag with you on the day of surgery, only pack items that are not valuable. [facility] IS NOT RESPONSIBLE   FOR VALUABLES THAT ARE LOST OR STOLEN.   Do not bring your home medications to the hospital. The Pharmacy will dispense medications listed on your medication list to you during your admission in the Hospital.  Please read over the following fact sheets you were given: IF YOU HAVE QUESTIONS ABOUT YOUR [place], [place] [phone].   [place] - Preparing for Surgery Before surgery, you can play an important role.  Because skin is not sterile, your skin needs to be as free of germs as possible.  You can reduce the number of germs on your skin by washing with CHG (chlorahexidine gluconate) soap before surgery.  CHG is an antiseptic cleaner which kills germs and bonds with the skin to continue killing germs even after washing. Please DO NOT use if you have an allergy to CHG or antibacterial soaps.  If your skin becomes reddened/irritated stop using the CHG and inform your nurse when you arrive at Short Stay. Do not shave (including legs and underarms) for at least 48 hours prior to the first CHG shower.  You may shave your face/neck.  Please follow these instructions carefully:  1.  Shower with CHG Soap the night before surgery and the  morning of surgery.  2.  If you choose to wash your hair, wash your hair first as usual with your normal  shampoo.  3.  After you shampoo, rinse your hair and body thoroughly to remove the shampoo.  4.  Use CHG as you would any other liquid soap.  You can apply chg directly to the skin and wash.  Gently with a scrungie or clean washcloth.  5.  Apply the CHG Soap to your body  ONLY FROM THE NECK DOWN.   Do not use on face/ open                           Wound or open sores. Avoid contact with eyes, ears mouth and genitals (private parts).                       Wash face,  Genitals (private parts) with your normal soap.             6.  Wash thoroughly, paying special attention to the area where your  surgery  will be performed.  7.  Thoroughly rinse your body with warm water from the neck down.  8.  DO NOT shower/wash with your normal soap after using and rinsing off the CHG Soap.            9.  Pat yourself dry with a clean towel.            10.  Wear clean pajamas.            11.  Place clean sheets on your bed the night of your first shower and do not  sleep with pets.  ON THE DAY OF SURGERY : Do not apply any lotions/deodorants the morning of surgery.  Please wear clean clothes to the hospital/surgery center.    FAILURE TO FOLLOW THESE INSTRUCTIONS MAY RESULT IN THE CANCELLATION OF YOUR SURGERY  PATIENT SIGNATURE_________________________________  NURSE SIGNATURE__________________________________  ________________________________________________________________________  Adam Phenix    An incentive spirometer is a tool that can help keep your lungs clear and active. This tool measures how well you are filling your lungs with each breath. Taking long deep breaths may help reverse or decrease the chance of developing breathing (pulmonary) problems (especially infection) following: A long period of time when you are unable to move or be active. BEFORE THE PROCEDURE  If the spirometer includes an indicator to show your best effort, your nurse or respiratory therapist will set it to a desired goal. If possible, sit up straight or lean slightly forward. Try not to slouch. Hold the incentive spirometer in an upright position. INSTRUCTIONS FOR USE  Sit on the edge of your bed if possible, or sit up as far as you can in bed or on a chair. Hold the  incentive spirometer in an upright position. Breathe out normally. Place the mouthpiece in your mouth and seal your lips tightly around it. Breathe in slowly and as deeply as possible, raising the piston or the ball toward the top of the column. Hold your breath for 3-5 seconds or for as long as possible. Allow the piston or ball to fall to the bottom of the column. Remove the mouthpiece from your mouth and breathe out normally. Rest for a few seconds and repeat Steps 1 through 7 at least 10 times every 1-2 hours when you are awake. Take your time and take a few normal breaths between deep breaths. The spirometer may include an indicator to show your best effort. Use the indicator as a goal to work toward during each repetition. After each set of 10 deep breaths, practice coughing to be sure your lungs are clear.  If you have an incision (the cut made at the time of surgery), support your incision when coughing by placing a pillow or rolled up towels firmly against it. Once you are able to get out of bed, walk around indoors and cough well. You may stop using the incentive spirometer when instructed by your caregiver.  RISKS AND COMPLICATIONS Take your time so you do not get dizzy or light-headed. If you are in pain, you may need to take or ask for pain medication before doing incentive spirometry. It is harder to take a deep breath if you are having pain. AFTER USE Rest and breathe slowly and easily. It can be helpful to keep track of a log of your progress. Your caregiver can provide you with a simple table to help with this. If you are using the spirometer at home, follow these instructions: [place] IF:  You are having difficultly using the spirometer. You have trouble using the spirometer as often as instructed. Your pain medication is not giving enough relief while using the spirometer. You develop fever of 100.5 F (38.1 C) or higher.                                                                                                     SEEK IMMEDIATE MEDICAL CARE IF:  You cough up bloody sputum that had not been present before. You develop fever of 102 F (38.9 C) or greater. You develop worsening pain at or near the incision site. MAKE SURE YOU:  Understand these instructions. Will watch your condition. Will get help right away if you are not doing well or get worse. Document Released: 02/10/2007 Document Revised: 12/23/2011 Document Reviewed: 04/13/2007 [facility] Patient Information 2014 [place], [place].

## 2023-01-06 NOTE — Progress Notes (Signed)
COVID Vaccine received:  []  No [x]  Yes Date of any COVID positive Test in last 90 days:  PCP -  Merrilee Seashore, MD Cardiologist - Adrian Prows, MD Pulmonology- Toney Rakes, MD   Chest x-ray - 04-12-2020  Epic EKG -  11-23-2021 epic   repeat at PST Stress Test - 2020 epic ECHO - 04-28-2020  Epic Cardiac Cath -   PCR screen: [x]  Ordered & Completed                      []   No Order but Needs PROFEND                      []   N/A for this surgery  Surgery Plan:  []  Ambulatory                            [x]  Outpatient in bed                            []  Admit  Anesthesia:    []  General  [x]  Spinal                           []   Choice []   MAC  Pacemaker / ICD device [x]  No []  Yes   Spinal Cord Stimulator:[x]  No []  Yes       History of Sleep Apnea? [x]  No []  Yes   CPAP used?- [x]  No []  Yes    Does the patient monitor blood sugar? []  No []  Yes  [x]  N/A  Patient has: []  Pre-DM   []  DM1  []   DM2 Does patient have a [employer] or Dexacom? []  No []  Yes   Fasting Blood Sugar Ranges-  Checks Blood Sugar _____ times a day  Blood Thinner / Instructions:Xarelto ? Aspirin Instructions:  ERAS Protocol Ordered: []  No  [x]  Yes PRE-SURGERY [x]  ENSURE  []  G2  Patient is to be NPO after: 11:15 am  Comments:   Activity level: Patient is able / unable to climb a flight of stairs without difficulty; []  No CP  []  No SOB, but would have ___   Patient can / can not perform ADLs without assistance.   Anesthesia review: HTN Hx Bilateral PEs and Popliteal DVT, CAD, COPD-2 PAH, GERD  Patient denies shortness of breath, fever, cough and chest pain at PAT appointment.  Patient verbalized understanding and agreement to the Pre-Surgical Instructions that were given to them at this PAT appointment. Patient was also educated of the need to review these PAT instructions again prior to her surgery.I reviewed the appropriate phone numbers to call if they have any and questions or concerns.

## 2023-01-09 ENCOUNTER — Other Ambulatory Visit: Payer: Self-pay

## 2023-01-09 ENCOUNTER — Encounter (HOSPITAL_COMMUNITY): Payer: Self-pay

## 2023-01-09 ENCOUNTER — Encounter (HOSPITAL_COMMUNITY)
Admission: RE | Admit: 2023-01-09 | Discharge: 2023-01-09 | Disposition: A | Discharge status: Home / Self Care | Payer: Medicare Other | Source: Ambulatory Visit | Attending: Orthopedic Surgery | Admitting: Orthopedic Surgery

## 2023-01-09 VITALS — BP 127/59 | HR 74 | Temp 98.4°F | Resp 16 | Ht 65.0 in | Wt 220.0 lb

## 2023-01-09 DIAGNOSIS — M1611 Unilateral primary osteoarthritis, right hip: Secondary | ICD-10-CM | POA: Insufficient documentation

## 2023-01-09 DIAGNOSIS — Z01818 Encounter for other preprocedural examination: Secondary | ICD-10-CM | POA: Insufficient documentation

## 2023-01-09 DIAGNOSIS — I10 Essential (primary) hypertension: Secondary | ICD-10-CM | POA: Diagnosis not present

## 2023-01-09 DIAGNOSIS — R9431 Abnormal electrocardiogram [ECG] [EKG]: Secondary | ICD-10-CM | POA: Diagnosis not present

## 2023-01-09 HISTORY — DX: Chronic obstructive pulmonary disease, unspecified: J44.9

## 2023-01-09 LAB — BASIC METABOLIC PANEL
Anion gap: 8 (ref 5–15)
BUN: 17 mg/dL (ref 8–23)
CO2: 27 mmol/L (ref 22–32)
Calcium: 9.3 mg/dL (ref 8.9–10.3)
Chloride: 103 mmol/L (ref 98–111)
Creatinine, Ser: 0.93 mg/dL (ref 0.44–1.00)
GFR, Estimated: >60 mL/min (ref >60)
Glucose, Bld: 121 mg/dL — ABNORMAL HIGH (ref 70–99)
Potassium: 3.7 mmol/L (ref 3.5–5.1)
Sodium: 138 mmol/L (ref 135–145)

## 2023-01-09 LAB — CBC
HCT: 36.8 % (ref 36.0–46.0)
Hemoglobin: 11.7 g/dL — ABNORMAL LOW (ref 12.0–15.0)
MCH: 27.3 pg (ref 26.0–34.0)
MCHC: 31.8 g/dL (ref 30.0–36.0)
MCV: 86 fL (ref 80.0–100.0)
Platelets: 222 10*3/uL (ref 150–400)
RBC: 4.28 MIL/uL (ref 3.87–5.11)
RDW: 14.9 % (ref 11.5–15.5)
WBC: 6.6 10*3/uL (ref 4.0–10.5)
nRBC: 0 % (ref 0.0–0.2)

## 2023-01-09 LAB — TYPE AND SCREEN
ABO/RH(D): AB POS
Antibody Screen: NEGATIVE

## 2023-01-09 LAB — SURGICAL PCR SCREEN
MRSA, PCR: NEGATIVE
Staphylococcus aureus: NEGATIVE

## 2023-01-14 NOTE — Progress Notes (Signed)
Anesthesia Chart Review   Case: [id number] Date/Time: 01/21/23 0950   Procedure: TOTAL HIP ARTHROPLASTY ANTERIOR APPROACH (Right: Hip)   Anesthesia type: Spinal   Pre-op diagnosis: Right hip osteoarthritis   Location: [facility] ROOM 09 / [facility]   Surgeons: Paralee Cancel, MD       DISCUSSION:87 y.o. former smoker with h/o HTN, GERD, COPD, DVT, PE, right hip OA scheduled for above procedure 01/21/2023 with Dr. Paralee Cancel.   Low risk stress test 03/17/2019.  Seen by cardiology in 2020, advised to follow up on prn basis.    Pt advised to hold Xarelto 3 days prior to procedure.  Reports last dose 01/17/2023.  VS: BP (!) 127/59 Comment: right arm sitting  Pulse 74   Temp 36.9 C (Oral)   Resp 16   Ht 5\' 5"  (1.651 m)   Wt 99.8 kg   SpO2 99%   BMI 36.61 kg/m   PROVIDERS: Merrilee Seashore, MD is PCP   Cardiologist - Adrian Prows, MD   Pulmonology- Baltazar Apo, MD  LABS: Labs reviewed: Acceptable for surgery. (all labs ordered are listed, but only abnormal results are displayed)  Labs Reviewed  BASIC METABOLIC PANEL - Abnormal; Notable for the following components:      Result Value   Glucose, Bld 121 (*)    All other components within normal limits  CBC - Abnormal; Notable for the following components:   Hemoglobin 11.7 (*)    All other components within normal limits  SURGICAL PCR SCREEN  TYPE AND SCREEN     IMAGES:   EKG:   CV: Echo 04/28/2020 1. Left ventricular ejection fraction, by estimation, is 60 to 65%. The  left ventricle has normal function. The left ventricle has no regional  wall motion abnormalities. There is mild concentric left ventricular  hypertrophy. Left ventricular diastolic  parameters are consistent with Grade I diastolic dysfunction (impaired  relaxation).   2. Right ventricular systolic function is normal. The right ventricular  size is normal. There is mildly elevated pulmonary artery systolic  pressure.   3. Left atrial size was mildly dilated.    4. The mitral valve is normal in structure. Trivial mitral valve  regurgitation. No evidence of mitral stenosis.   5. The aortic valve is tricuspid. Aortic valve regurgitation is trivial.  No aortic stenosis is present.   6. Aortic dilatation noted. There is mild dilatation of the ascending  aorta measuring 37 mm.   7. The inferior vena cava is dilated in size with <50% respiratory  variability, suggesting right atrial pressure of 15 mmHg.   Lexiscan Myoview Stress Test 03/17/2019: Stress EKG is non-diagnostic, as this is pharmacological stress test. Myocardial pefusion imaging is normal. Left ventricular ejection fraction is  66% with normal wall motion. Low risk study. Past Medical History:  Diagnosis Date   Aortic atherosclerosis ([place]) 12/04/2018   Arthritis    oa and pain left knee;  previous right total knee replacement  1998   CAD (coronary artery disease) 12/04/2018   Chest pain 12/18/2018   COPD (chronic obstructive pulmonary disease) (HCC)    GERD (gastroesophageal reflux disease)    seldom   H/O cardiovascular stress test 05/27/2012   STRESS TEST WAS DONE for cardiac clearance for left total knee replacement--given clearance by dr. Einar Gip.   EF 71%, NO ISCHEMIA   Hyperlipidemia    Hypertension    Shortness of breath    with exertion   SOB (shortness of breath) 12/18/2018    Past  Surgical History:  Procedure Laterality Date   EYE SURGERY Bilateral    cataract surgery w/ IOL   HAND SURGERY Bilateral    carpal tunnel release bilateral   HERNIA REPAIR     38 yrs ago    umbilical hernia repair   JOINT REPLACEMENT Right 1998   right total knee replacement   TOTAL KNEE ARTHROPLASTY  06/09/2012   Procedure: TOTAL KNEE ARTHROPLASTY;  Surgeon: Mauri Pole, MD;  Location: [facility];  Service: Orthopedics;  Laterality: Left;   TUBAL LIGATION      MEDICATIONS:  acetaminophen (TYLENOL) 325 MG tablet   albuterol (PROAIR HFA) 108 (90 Base) MCG/ACT inhaler   amLODipine  (NORVASC) 10 MG tablet   amLODipine (NORVASC) 5 MG tablet   atorvastatin (LIPITOR) 20 MG tablet   Cholecalciferol (VITAMIN D) 50 MCG (2000 UT) tablet   fluticasone (FLONASE ALLERGY RELIEF) 50 MCG/ACT nasal spray   hydrochlorothiazide (HYDRODIURIL) 25 MG tablet   labetalol (NORMODYNE) 200 MG tablet   losartan (COZAAR) 100 MG tablet   Polyethyl Glycol-Propyl Glycol 0.4-0.3 % SOLN   rivaroxaban (XARELTO) 20 MG TABS tablet   SIMBRINZA 1-0.2 % SUSP   STIOLTO RESPIMAT 2.5-2.5 MCG/ACT AERS   triamcinolone cream (KENALOG) 0.1 %   No current facility-administered medications for this encounter.    Konrad Felix Ward, PA-C [facility] Pre-Surgical Testing [phone]

## 2023-01-16 DIAGNOSIS — N182 Chronic kidney disease, stage 2 (mild): Secondary | ICD-10-CM | POA: Diagnosis not present

## 2023-01-16 DIAGNOSIS — E119 Type 2 diabetes mellitus without complications: Secondary | ICD-10-CM | POA: Diagnosis not present

## 2023-01-16 DIAGNOSIS — Z01818 Encounter for other preprocedural examination: Secondary | ICD-10-CM | POA: Diagnosis not present

## 2023-01-16 DIAGNOSIS — I10 Essential (primary) hypertension: Secondary | ICD-10-CM | POA: Diagnosis not present

## 2023-01-16 DIAGNOSIS — Z86718 Personal history of other venous thrombosis and embolism: Secondary | ICD-10-CM | POA: Diagnosis not present

## 2023-01-16 DIAGNOSIS — E782 Mixed hyperlipidemia: Secondary | ICD-10-CM | POA: Diagnosis not present

## 2023-01-16 DIAGNOSIS — Z86711 Personal history of pulmonary embolism: Secondary | ICD-10-CM | POA: Diagnosis not present

## 2023-01-16 NOTE — H&P (Signed)
TOTAL HIP ADMISSION H&P  Patient is admitted for right total hip arthroplasty.  Subjective:  Chief Complaint: right hip pain  HPI: Kaitlin Arnold, 87 y.o. female, has a history of pain and functional disability in the right hip(s) due to arthritis and patient has failed non-surgical conservative treatments for greater than 12 weeks to include NSAID's and/or analgesics and activity modification.  Onset of symptoms was gradual starting 2 years ago with gradually worsening course since that time.The patient noted no past surgery on the right hip(s).  Patient currently rates pain in the right hip at 8 out of 10 with activity. Patient has worsening of pain with activity and weight bearing, pain that interfers with activities of daily living, and pain with passive range of motion. Patient has evidence of joint space narrowing by imaging studies. This condition presents safety issues increasing the risk of falls.  There is no current active infection.  Patient Active Problem List   Diagnosis Date Noted   COPD (chronic obstructive pulmonary disease) 12/05/2020   SOB (shortness of breath) 12/18/2018   Chest pain 12/18/2018   Coronary artery calcification seen on CT scan 12/18/2018   Essential hypertension 12/18/2018   Former tobacco use 12/18/2018   Hyperlipidemia associated with type 2 diabetes mellitus 12/18/2018   Aortic atherosclerosis 12/04/2018   CAD (coronary artery disease) 12/04/2018   Left kidney mass 12/03/2018   Bilateral pulmonary embolism 12/03/2018   Acute deep vein thrombosis (DVT) of right popliteal vein 12/03/2018   Expected blood loss anemia 06/10/2012   UTI (urinary tract infection) 06/10/2012   S/P left TKA 06/09/2012   Past Medical History:  Diagnosis Date   Aortic atherosclerosis ([place]) 12/04/2018   Arthritis    oa and pain left knee;  previous right total knee replacement  1998   CAD (coronary artery disease) 12/04/2018   Chest pain 12/18/2018   COPD (chronic  obstructive pulmonary disease) ([facility])    GERD (gastroesophageal reflux disease)    seldom   H/O cardiovascular stress test 05/27/2012   STRESS TEST WAS DONE for cardiac clearance for left total knee replacement--given clearance by dr. Einar Gip.   EF 71%, NO ISCHEMIA   Hyperlipidemia    Hypertension    Shortness of breath    with exertion   SOB (shortness of breath) 12/18/2018    Past Surgical History:  Procedure Laterality Date   EYE SURGERY Bilateral    cataract surgery w/ IOL   HAND SURGERY Bilateral    carpal tunnel release bilateral   HERNIA REPAIR     38 yrs ago    umbilical hernia repair   JOINT REPLACEMENT Right 1998   right total knee replacement   TOTAL KNEE ARTHROPLASTY  06/09/2012   Procedure: TOTAL KNEE ARTHROPLASTY;  Surgeon: Mauri Pole, MD;  Location: [facility];  Service: Orthopedics;  Laterality: Left;   TUBAL LIGATION      No current facility-administered medications for this encounter.   Current Outpatient Medications  Medication Sig Dispense Refill Last Dose   acetaminophen (TYLENOL) 325 MG tablet Take 650 mg by mouth every 6 (six) hours as needed for moderate pain.       albuterol (PROAIR HFA) 108 (90 Base) MCG/ACT inhaler Inhale 2 puffs into the lungs every 6 (six) hours as needed for wheezing or shortness of breath. 18 g 5    amLODipine (NORVASC) 5 MG tablet Take 5 mg by mouth daily.      atorvastatin (LIPITOR) 20 MG tablet Take 20 mg by  mouth every evening.      Cholecalciferol (VITAMIN D) 50 MCG (2000 UT) tablet Take 2,000 Units by mouth daily.      fluticasone (FLONASE ALLERGY RELIEF) 50 MCG/ACT nasal spray Place 1 spray into both nostrils daily as needed for allergies.      hydrochlorothiazide (HYDRODIURIL) 25 MG tablet Take 25 mg by mouth daily.      labetalol (NORMODYNE) 200 MG tablet Take 1 tablet by mouth twice daily (Patient taking differently: Take 100 mg by mouth 2 (two) times daily.) 180 tablet 1    losartan (COZAAR) 100 MG tablet Take 100 mg by  mouth daily.      Polyethyl Glycol-Propyl Glycol 0.4-0.3 % SOLN Apply 1 drop to eye 3 (three) times daily as needed (dry eyes).       rivaroxaban (XARELTO) 20 MG TABS tablet Take 1 tablet (20 mg total) by mouth daily with supper. 30 tablet 2    SIMBRINZA 1-0.2 % SUSP Place 1 drop into both eyes in the morning and at bedtime.      STIOLTO RESPIMAT 2.5-2.5 MCG/ACT AERS INHALE 2 PUFFS BY MOUTH ONCE DAILY 4 g 0    triamcinolone cream (KENALOG) 0.1 % Apply 1 application topically 2 (two) times daily as needed (dermatitis).       amLODipine (NORVASC) 10 MG tablet TAKE 1 TABLET BY MOUTH ONCE DAILY IN THE MORNING (Patient not taking: Reported on 01/06/2023) 90 tablet 1 Not Taking   No Known Allergies  Social History   Tobacco Use   Smoking status: Former    Packs/day: 0.50    Years: 40.00    Additional pack years: 0.00    Total pack years: 20.00    Types: Cigarettes    Quit date: 12/04/2018    Years since quitting: 4.1   Smokeless tobacco: Never   Tobacco comments:    not everyday smoker--if smoking 4 to 5 cigs a day  Substance Use Topics   Alcohol use: Yes    Comment: very seldom    Family History  Problem Relation Age of Onset   Diabetes Father    Heart attack Sister    Colon cancer Sister    Colon cancer Sister      Review of Systems  Constitutional:  Negative for chills and fever.  Respiratory:  Negative for cough and shortness of breath.   Cardiovascular:  Negative for chest pain.  Gastrointestinal:  Negative for nausea and vomiting.  Musculoskeletal:  Positive for arthralgias.     Objective:  Physical Exam Well nourished and well developed. General: Alert and oriented x3, cooperative and pleasant, no acute distress. Head: normocephalic, atraumatic, neck supple. Eyes: EOMI.   Musculoskeletal: Right hip exam: She does have reproducible anterior based hip pain with hip flexion internal rotation close to 10 degrees with external rotation over 20 degrees Mild tenderness  around the right hip Left hip exam: Similar range of motion but without significant reproducible groin pain   Calves soft and nontender. Motor function intact in LE. Strength 5/5 LE bilaterally. Neuro: Distal pulses 2+. Sensation to light touch intact in LE.  Vital signs in last 24 hours:    Labs:   Estimated body mass index is 36.61 kg/m as calculated from the following:   Height as of 01/09/23: 5\' 5"  (1.651 m).   Weight as of 01/09/23: 99.8 kg.   Imaging Review Plain radiographs demonstrate severe degenerative joint disease of the right hip(s). The bone quality appears to be adequate for age and  reported activity level.      Assessment/Plan:  End stage arthritis, right hip(s)  The patient history, physical examination, clinical judgement of the provider and imaging studies are consistent with end stage degenerative joint disease of the right hip(s) and total hip arthroplasty is deemed medically necessary. The treatment options including medical management, injection therapy, arthroscopy and arthroplasty were discussed at length. The risks and benefits of total hip arthroplasty were presented and reviewed. The risks due to aseptic loosening, infection, stiffness, dislocation/subluxation,  thromboembolic complications and other imponderables were discussed.  The patient acknowledged the explanation, agreed to proceed with the plan and consent was signed. Patient is being admitted for inpatient treatment for surgery, pain control, PT, OT, prophylactic antibiotics, VTE prophylaxis, progressive ambulation and ADL's and discharge planning.The patient is planning to be discharged  home.   Therapy Plans: HEP Disposition: Home with daughters (staying at her own house) Planned DVT Prophylaxis: Xarelto 20mg  daily DME needed: none PCP: Dr. Ashby Dawes, appointment on 4/3 Cardio: Doesn't currently see one Hematologist: Dr. Lindi Adie - due to hx of PE TXA: IV Allergies: NKDA Anesthesia  Concerns: none BMI: 38.3 Last HgbA1c: Borderline   Other: - Hx of PE - tramadol/oxycodone, robaxin, tylenol - Home with both daughters helping - one daughter is a family physician  Costella Hatcher, PA-C Orthopedic Surgery [facility] [place] [phone]

## 2023-01-21 ENCOUNTER — Encounter (HOSPITAL_COMMUNITY): Payer: Self-pay | Admitting: Orthopedic Surgery

## 2023-01-21 ENCOUNTER — Ambulatory Visit (HOSPITAL_COMMUNITY): Payer: Medicare Other

## 2023-01-21 ENCOUNTER — Other Ambulatory Visit: Payer: Self-pay

## 2023-01-21 ENCOUNTER — Observation Stay (HOSPITAL_COMMUNITY): Payer: Medicare Other

## 2023-01-21 ENCOUNTER — Observation Stay (HOSPITAL_COMMUNITY)
Admission: RE | Admit: 2023-01-21 | Discharge: 2023-01-22 | Disposition: A | Discharge status: Home / Self Care | Payer: Medicare Other | Attending: Orthopedic Surgery | Admitting: Orthopedic Surgery

## 2023-01-21 ENCOUNTER — Encounter (HOSPITAL_COMMUNITY)
Admission: RE | Disposition: A | Discharge status: Home / Self Care | Payer: Self-pay | Source: Home / Self Care | Attending: Orthopedic Surgery

## 2023-01-21 ENCOUNTER — Ambulatory Visit (HOSPITAL_BASED_OUTPATIENT_CLINIC_OR_DEPARTMENT_OTHER): Payer: Medicare Other | Admitting: Certified Registered Nurse Anesthetist

## 2023-01-21 ENCOUNTER — Ambulatory Visit (HOSPITAL_COMMUNITY): Payer: Medicare Other | Admitting: Physician Assistant

## 2023-01-21 DIAGNOSIS — Z96653 Presence of artificial knee joint, bilateral: Secondary | ICD-10-CM | POA: Insufficient documentation

## 2023-01-21 DIAGNOSIS — I10 Essential (primary) hypertension: Secondary | ICD-10-CM | POA: Insufficient documentation

## 2023-01-21 DIAGNOSIS — M25751 Osteophyte, right hip: Secondary | ICD-10-CM

## 2023-01-21 DIAGNOSIS — Z86718 Personal history of other venous thrombosis and embolism: Secondary | ICD-10-CM | POA: Insufficient documentation

## 2023-01-21 DIAGNOSIS — Z79899 Other long term (current) drug therapy: Secondary | ICD-10-CM | POA: Diagnosis not present

## 2023-01-21 DIAGNOSIS — I2510 Atherosclerotic heart disease of native coronary artery without angina pectoris: Secondary | ICD-10-CM | POA: Diagnosis not present

## 2023-01-21 DIAGNOSIS — Z96641 Presence of right artificial hip joint: Secondary | ICD-10-CM

## 2023-01-21 DIAGNOSIS — Z7901 Long term (current) use of anticoagulants: Secondary | ICD-10-CM | POA: Insufficient documentation

## 2023-01-21 DIAGNOSIS — Z86711 Personal history of pulmonary embolism: Secondary | ICD-10-CM | POA: Insufficient documentation

## 2023-01-21 DIAGNOSIS — M1611 Unilateral primary osteoarthritis, right hip: Principal | ICD-10-CM | POA: Insufficient documentation

## 2023-01-21 DIAGNOSIS — J449 Chronic obstructive pulmonary disease, unspecified: Secondary | ICD-10-CM | POA: Insufficient documentation

## 2023-01-21 DIAGNOSIS — Z87891 Personal history of nicotine dependence: Secondary | ICD-10-CM

## 2023-01-21 DIAGNOSIS — Z471 Aftercare following joint replacement surgery: Secondary | ICD-10-CM | POA: Diagnosis not present

## 2023-01-21 HISTORY — PX: TOTAL HIP ARTHROPLASTY: SHX124

## 2023-01-21 SURGERY — ARTHROPLASTY, HIP, TOTAL, ANTERIOR APPROACH
Anesthesia: Spinal | Site: Hip | Laterality: Right

## 2023-01-21 MED ORDER — TRANEXAMIC ACID-NACL 1000-0.7 MG/100ML-% IV SOLN
1000.0000 mg | Freq: Once | INTRAVENOUS | Status: AC
  Administered 2023-01-21: 1000 mg via INTRAVENOUS
  Filled 2023-01-21: qty 100

## 2023-01-21 MED ORDER — ACETAMINOPHEN 500 MG PO TABS
1000.0000 mg | ORAL_TABLET | Freq: Four times a day (QID) | ORAL | Status: DC
  Administered 2023-01-21 – 2023-01-22 (×3): 1000 mg via ORAL
  Filled 2023-01-21 (×3): qty 2

## 2023-01-21 MED ORDER — RIVAROXABAN 20 MG PO TABS
20.0000 mg | ORAL_TABLET | Freq: Every day | ORAL | Status: DC
  Administered 2023-01-22: 20 mg via ORAL
  Filled 2023-01-21: qty 2

## 2023-01-21 MED ORDER — BRINZOLAMIDE 1 % OP SUSP
1.0000 [drp] | Freq: Two times a day (BID) | OPHTHALMIC | Status: DC
  Administered 2023-01-21 – 2023-01-22 (×2): 1 [drp] via OPHTHALMIC
  Filled 2023-01-21: qty 10

## 2023-01-21 MED ORDER — KETOROLAC TROMETHAMINE 30 MG/ML IJ SOLN
INTRAMUSCULAR | Status: AC
  Filled 2023-01-21: qty 1

## 2023-01-21 MED ORDER — BISACODYL 10 MG RE SUPP: 10.0000 mg | Freq: Every day | RECTAL | Status: DC | PRN

## 2023-01-21 MED ORDER — DEXAMETHASONE SODIUM PHOSPHATE 10 MG/ML IJ SOLN
INTRAMUSCULAR | Status: DC | PRN
  Administered 2023-01-21: 10 mg via INTRAVENOUS

## 2023-01-21 MED ORDER — ONDANSETRON HCL 4 MG/2ML IJ SOLN: 4.0000 mg | Freq: Once | INTRAMUSCULAR | Status: DC | PRN

## 2023-01-21 MED ORDER — OXYCODONE HCL 5 MG PO TABS
5.0000 mg | ORAL_TABLET | ORAL | Status: DC | PRN
  Administered 2023-01-21 – 2023-01-22 (×4): 10 mg via ORAL
  Filled 2023-01-21 (×4): qty 2

## 2023-01-21 MED ORDER — ATORVASTATIN CALCIUM 20 MG PO TABS
20.0000 mg | ORAL_TABLET | Freq: Every evening | ORAL | Status: DC
  Administered 2023-01-21: 20 mg via ORAL
  Filled 2023-01-21: qty 1

## 2023-01-21 MED ORDER — DEXMEDETOMIDINE HCL IN NACL 80 MCG/20ML IV SOLN
INTRAVENOUS | Status: DC | PRN
  Administered 2023-01-21: 8 ug via BUCCAL

## 2023-01-21 MED ORDER — ORAL CARE MOUTH RINSE: 15.0000 mL | Freq: Once | OROMUCOSAL | Status: AC

## 2023-01-21 MED ORDER — ALBUTEROL SULFATE (2.5 MG/3ML) 0.083% IN NEBU
3.0000 mL | INHALATION_SOLUTION | Freq: Four times a day (QID) | RESPIRATORY_TRACT | Status: DC | PRN
  Administered 2023-01-22: 3 mL via RESPIRATORY_TRACT
  Filled 2023-01-21: qty 3

## 2023-01-21 MED ORDER — METHOCARBAMOL 500 MG IVPB - SIMPLE MED: 500.0000 mg | Freq: Four times a day (QID) | INTRAVENOUS | Status: DC | PRN

## 2023-01-21 MED ORDER — HYDROCHLOROTHIAZIDE 25 MG PO TABS
25.0000 mg | ORAL_TABLET | Freq: Every day | ORAL | Status: DC
  Filled 2023-01-21: qty 1

## 2023-01-21 MED ORDER — AMISULPRIDE (ANTIEMETIC) 5 MG/2ML IV SOLN: 10.0000 mg | Freq: Once | INTRAVENOUS | Status: DC | PRN

## 2023-01-21 MED ORDER — MENTHOL 3 MG MT LOZG: 1.0000 | LOZENGE | OROMUCOSAL | Status: DC | PRN

## 2023-01-21 MED ORDER — METHOCARBAMOL 500 MG IVPB - SIMPLE MED
INTRAVENOUS | Status: AC
  Administered 2023-01-21: 500 mg via INTRAVENOUS
  Filled 2023-01-21: qty 55

## 2023-01-21 MED ORDER — METOCLOPRAMIDE HCL 5 MG/ML IJ SOLN: 5.0000 mg | Freq: Three times a day (TID) | INTRAMUSCULAR | Status: DC | PRN

## 2023-01-21 MED ORDER — ARFORMOTEROL TARTRATE 15 MCG/2ML IN NEBU
15.0000 ug | INHALATION_SOLUTION | Freq: Two times a day (BID) | RESPIRATORY_TRACT | Status: DC
  Administered 2023-01-21 – 2023-01-22 (×2): 15 ug via RESPIRATORY_TRACT
  Filled 2023-01-21: qty 2

## 2023-01-21 MED ORDER — LACTATED RINGERS IV SOLN: INTRAVENOUS | Status: DC

## 2023-01-21 MED ORDER — TRANEXAMIC ACID-NACL 1000-0.7 MG/100ML-% IV SOLN
1000.0000 mg | INTRAVENOUS | Status: AC
  Administered 2023-01-21: 1000 mg via INTRAVENOUS
  Filled 2023-01-21: qty 100

## 2023-01-21 MED ORDER — CEFAZOLIN SODIUM-DEXTROSE 2-4 GM/100ML-% IV SOLN
2.0000 g | Freq: Four times a day (QID) | INTRAVENOUS | Status: AC
  Administered 2023-01-21 (×2): 2 g via INTRAVENOUS
  Filled 2023-01-21 (×2): qty 100

## 2023-01-21 MED ORDER — ACETAMINOPHEN 325 MG PO TABS: 325.0000 mg | ORAL_TABLET | Freq: Four times a day (QID) | ORAL | Status: DC | PRN

## 2023-01-21 MED ORDER — DOCUSATE SODIUM 100 MG PO CAPS
100.0000 mg | ORAL_CAPSULE | Freq: Two times a day (BID) | ORAL | Status: DC
  Administered 2023-01-21 – 2023-01-22 (×2): 100 mg via ORAL
  Filled 2023-01-21 (×2): qty 1

## 2023-01-21 MED ORDER — SODIUM CHLORIDE (PF) 0.9 % IJ SOLN
INTRAMUSCULAR | Status: AC
  Filled 2023-01-21: qty 50

## 2023-01-21 MED ORDER — ONDANSETRON HCL 4 MG/2ML IJ SOLN: 4.0000 mg | Freq: Four times a day (QID) | INTRAMUSCULAR | Status: DC | PRN

## 2023-01-21 MED ORDER — CHLORHEXIDINE GLUCONATE 0.12 % MT SOLN
15.0000 mL | Freq: Once | OROMUCOSAL | Status: AC
  Administered 2023-01-21: 15 mL via OROMUCOSAL

## 2023-01-21 MED ORDER — ACETAMINOPHEN 500 MG PO TABS
1000.0000 mg | ORAL_TABLET | Freq: Once | ORAL | Status: AC
  Administered 2023-01-21: 1000 mg via ORAL
  Filled 2023-01-21: qty 2

## 2023-01-21 MED ORDER — BUPIVACAINE IN DEXTROSE 0.75-8.25 % IT SOLN
INTRATHECAL | Status: DC | PRN
  Administered 2023-01-21: 1.8 mL via INTRATHECAL

## 2023-01-21 MED ORDER — AMLODIPINE BESYLATE 10 MG PO TABS: 10.0000 mg | ORAL_TABLET | Freq: Every morning | ORAL | Status: DC

## 2023-01-21 MED ORDER — TRAMADOL HCL 50 MG PO TABS: 50.0000 mg | ORAL_TABLET | Freq: Three times a day (TID) | ORAL | Status: DC | PRN

## 2023-01-21 MED ORDER — 0.9 % SODIUM CHLORIDE (POUR BTL) OPTIME
TOPICAL | Status: DC | PRN
  Administered 2023-01-21: 1000 mL

## 2023-01-21 MED ORDER — POLYETHYLENE GLYCOL 3350 17 G PO PACK
17.0000 g | PACK | Freq: Two times a day (BID) | ORAL | Status: DC
  Administered 2023-01-21 – 2023-01-22 (×2): 17 g via ORAL
  Filled 2023-01-21 (×2): qty 1

## 2023-01-21 MED ORDER — ONDANSETRON HCL 4 MG PO TABS: 4.0000 mg | ORAL_TABLET | Freq: Four times a day (QID) | ORAL | Status: DC | PRN

## 2023-01-21 MED ORDER — FENTANYL CITRATE PF 50 MCG/ML IJ SOSY
25.0000 ug | PREFILLED_SYRINGE | INTRAMUSCULAR | Status: DC | PRN
  Administered 2023-01-21: 25 ug via INTRAVENOUS

## 2023-01-21 MED ORDER — HYDROMORPHONE HCL 1 MG/ML IJ SOLN: 0.5000 mg | INTRAMUSCULAR | Status: DC | PRN

## 2023-01-21 MED ORDER — AMLODIPINE BESYLATE 5 MG PO TABS
5.0000 mg | ORAL_TABLET | Freq: Every day | ORAL | Status: DC
  Administered 2023-01-22: 5 mg via ORAL
  Filled 2023-01-21: qty 1

## 2023-01-21 MED ORDER — CEFAZOLIN SODIUM-DEXTROSE 2-4 GM/100ML-% IV SOLN
2.0000 g | INTRAVENOUS | Status: AC
  Administered 2023-01-21: 2 g via INTRAVENOUS
  Filled 2023-01-21: qty 100

## 2023-01-21 MED ORDER — DIPHENHYDRAMINE HCL 12.5 MG/5ML PO ELIX: 12.5000 mg | ORAL_SOLUTION | ORAL | Status: DC | PRN

## 2023-01-21 MED ORDER — EPHEDRINE SULFATE-NACL 50-0.9 MG/10ML-% IV SOSY
PREFILLED_SYRINGE | INTRAVENOUS | Status: DC | PRN
  Administered 2023-01-21: 5 mg via INTRAVENOUS

## 2023-01-21 MED ORDER — LABETALOL HCL 100 MG PO TABS
100.0000 mg | ORAL_TABLET | Freq: Two times a day (BID) | ORAL | Status: DC
  Administered 2023-01-21 – 2023-01-22 (×2): 100 mg via ORAL
  Filled 2023-01-21 (×2): qty 1

## 2023-01-21 MED ORDER — LOSARTAN POTASSIUM 50 MG PO TABS
100.0000 mg | ORAL_TABLET | Freq: Every day | ORAL | Status: DC
  Filled 2023-01-21: qty 2

## 2023-01-21 MED ORDER — STERILE WATER FOR IRRIGATION IR SOLN
Status: DC | PRN
  Administered 2023-01-21: 2000 mL

## 2023-01-21 MED ORDER — BUPIVACAINE HCL 0.25 % IJ SOLN
INTRAMUSCULAR | Status: AC
  Filled 2023-01-21: qty 1

## 2023-01-21 MED ORDER — DEXAMETHASONE SODIUM PHOSPHATE 10 MG/ML IJ SOLN
10.0000 mg | Freq: Once | INTRAMUSCULAR | Status: AC
  Administered 2023-01-22: 10 mg via INTRAVENOUS
  Filled 2023-01-21: qty 1

## 2023-01-21 MED ORDER — UMECLIDINIUM BROMIDE 62.5 MCG/ACT IN AEPB
1.0000 | INHALATION_SPRAY | Freq: Every day | RESPIRATORY_TRACT | Status: DC
  Administered 2023-01-22: 1 via RESPIRATORY_TRACT
  Filled 2023-01-21: qty 7

## 2023-01-21 MED ORDER — BUPIVACAINE HCL (PF) 0.25 % IJ SOLN
INTRAMUSCULAR | Status: AC
  Filled 2023-01-21: qty 30

## 2023-01-21 MED ORDER — FLUTICASONE PROPIONATE 50 MCG/ACT NA SUSP: 1.0000 | Freq: Every day | NASAL | Status: DC | PRN

## 2023-01-21 MED ORDER — BRIMONIDINE TARTRATE 0.2 % OP SOLN
1.0000 [drp] | Freq: Two times a day (BID) | OPHTHALMIC | Status: DC
  Administered 2023-01-21 – 2023-01-22 (×2): 1 [drp] via OPHTHALMIC
  Filled 2023-01-21: qty 5

## 2023-01-21 MED ORDER — FENTANYL CITRATE PF 50 MCG/ML IJ SOSY
PREFILLED_SYRINGE | INTRAMUSCULAR | Status: AC
  Administered 2023-01-21: 25 ug via INTRAVENOUS
  Filled 2023-01-21: qty 1

## 2023-01-21 MED ORDER — SODIUM CHLORIDE (PF) 0.9 % IJ SOLN
INTRAMUSCULAR | Status: DC | PRN
  Administered 2023-01-21: 30 mL

## 2023-01-21 MED ORDER — METOCLOPRAMIDE HCL 5 MG PO TABS: 5.0000 mg | ORAL_TABLET | Freq: Three times a day (TID) | ORAL | Status: DC | PRN

## 2023-01-21 MED ORDER — DEXAMETHASONE SODIUM PHOSPHATE 10 MG/ML IJ SOLN: 8.0000 mg | Freq: Once | INTRAMUSCULAR | Status: DC

## 2023-01-21 MED ORDER — PROPOFOL 500 MG/50ML IV EMUL
INTRAVENOUS | Status: DC | PRN
  Administered 2023-01-21: 50 ug/kg/min via INTRAVENOUS
  Administered 2023-01-21: 30 mg via INTRAVENOUS

## 2023-01-21 MED ORDER — KETOROLAC TROMETHAMINE 30 MG/ML IJ SOLN
INTRAMUSCULAR | Status: DC | PRN
  Administered 2023-01-21: 30 mg

## 2023-01-21 MED ORDER — PHENOL 1.4 % MT LIQD: 1.0000 | OROMUCOSAL | Status: DC | PRN

## 2023-01-21 MED ORDER — METHOCARBAMOL 500 MG PO TABS
500.0000 mg | ORAL_TABLET | Freq: Four times a day (QID) | ORAL | Status: DC | PRN
  Administered 2023-01-21 – 2023-01-22 (×2): 500 mg via ORAL
  Filled 2023-01-21 (×2): qty 1

## 2023-01-21 MED ORDER — ONDANSETRON HCL 4 MG/2ML IJ SOLN
INTRAMUSCULAR | Status: DC | PRN
  Administered 2023-01-21: 4 mg via INTRAVENOUS

## 2023-01-21 MED ORDER — BUPIVACAINE HCL (PF) 0.25 % IJ SOLN
INTRAMUSCULAR | Status: DC | PRN
  Administered 2023-01-21: 30 mL

## 2023-01-21 MED ORDER — POVIDONE-IODINE 10 % EX SWAB
2.0000 | Freq: Once | CUTANEOUS | Status: AC
  Administered 2023-01-21: 2 via TOPICAL

## 2023-01-21 MED ORDER — SODIUM CHLORIDE 0.9 % IV SOLN: INTRAVENOUS | Status: DC

## 2023-01-21 SURGICAL SUPPLY — 42 items
ADH SKN CLS APL DERMABOND .7 (GAUZE/BANDAGES/DRESSINGS) ×1
BAG COUNTER SPONGE SURGICOUNT (BAG) IMPLANT
BAG DECANTER FOR FLEXI CONT (MISCELLANEOUS) IMPLANT
BAG SPEC THK2 15X12 ZIP CLS (MISCELLANEOUS)
BAG SPNG CNTER NS LX DISP (BAG)
BAG ZIPLOCK 12X15 (MISCELLANEOUS) IMPLANT
BLADE SAG 18X100X1.27 (BLADE) ×2 IMPLANT
COVER PERINEAL POST (MISCELLANEOUS) ×2 IMPLANT
COVER SURGICAL LIGHT HANDLE (MISCELLANEOUS) ×2 IMPLANT
CUP ACETBLR 52 OD PINNACLE (Hips) IMPLANT
DERMABOND ADVANCED .7 DNX12 (GAUZE/BANDAGES/DRESSINGS) ×2 IMPLANT
DRAPE FOOT SWITCH (DRAPES) ×2 IMPLANT
DRAPE STERI IOBAN 125X83 (DRAPES) ×2 IMPLANT
DRAPE U-SHAPE 47X51 STRL (DRAPES) ×4 IMPLANT
DRESSING AQUACEL AG SP 3.5X10 (GAUZE/BANDAGES/DRESSINGS) ×2 IMPLANT
DRSG AQUACEL AG ADV 3.5X10 (GAUZE/BANDAGES/DRESSINGS) IMPLANT
DRSG AQUACEL AG SP 3.5X10 (GAUZE/BANDAGES/DRESSINGS) ×1
DURAPREP 26ML APPLICATOR (WOUND CARE) ×2 IMPLANT
ELECT REM PT RETURN 15FT ADLT (MISCELLANEOUS) ×2 IMPLANT
GLOVE BIO SURGEON STRL SZ 6 (GLOVE) ×2 IMPLANT
GLOVE BIOGEL PI IND STRL 6.5 (GLOVE) ×2 IMPLANT
GLOVE BIOGEL PI IND STRL 7.5 (GLOVE) ×2 IMPLANT
GLOVE ORTHO TXT STRL SZ7.5 (GLOVE) ×4 IMPLANT
GOWN STRL REUS W/ TWL LRG LVL3 (GOWN DISPOSABLE) ×4 IMPLANT
GOWN STRL REUS W/TWL LRG LVL3 (GOWN DISPOSABLE) ×2
HEAD M SROM 36MM PLUS 1.5 (Hips) IMPLANT
HOLDER FOLEY CATH W/STRAP (MISCELLANEOUS) ×2 IMPLANT
KIT TURNOVER KIT A (KITS) IMPLANT
LINER NEUTRAL 52X36MM PLUS 4 (Liner) IMPLANT
PACK ANTERIOR HIP CUSTOM (KITS) ×2 IMPLANT
SCREW 6.5MMX25MM (Screw) IMPLANT
SROM M HEAD 36MM PLUS 1.5 (Hips) ×1 IMPLANT
STEM FEMORAL SZ6 HIGH ACTIS (Stem) IMPLANT
SUT MNCRL AB 4-0 PS2 18 (SUTURE) ×2 IMPLANT
SUT STRATAFIX 0 PDS 27 VIOLET (SUTURE) ×1
SUT VIC AB 1 CT1 36 (SUTURE) ×6 IMPLANT
SUT VIC AB 2-0 CT1 27 (SUTURE) ×2
SUT VIC AB 2-0 CT1 TAPERPNT 27 (SUTURE) ×4 IMPLANT
SUTURE STRATFX 0 PDS 27 VIOLET (SUTURE) ×2 IMPLANT
TRAY FOLEY MTR SLVR 16FR STAT (SET/KITS/TRAYS/PACK) IMPLANT
TUBE SUCTION HIGH CAP CLEAR NV (SUCTIONS) ×2 IMPLANT
WATER STERILE IRR 1000ML POUR (IV SOLUTION) ×2 IMPLANT

## 2023-01-21 NOTE — Care Plan (Signed)
Ortho Bundle Case Management Note  Patient Details  Name: Kaitlin Arnold MRN: [id number] Date of Birth: [birth year]                  R THA on 01-21-23  DCP: Home with dtr  DME: No needs, has a RW  PT: HEP   DME Arranged:  N/A DME Agency:       Additional Comments: Please contact me with any questions of if this plan should need to change.   Ennis Forts, RN,CCM [facility]  [phone] 01/21/2023, 9:34 AM

## 2023-01-21 NOTE — Anesthesia Procedure Notes (Signed)
Spinal  Patient location during procedure: OR Start time: 01/21/2023 9:50 AM End time: 01/21/2023 9:57 AM Reason for block: surgical anesthesia Staffing Performed: anesthesiologist  Anesthesiologist: Leonides Grills, MD Performed by: Leonides Grills, MD Authorized by: Leonides Grills, MD   Preanesthetic Checklist Completed: patient identified, IV checked, risks and benefits discussed, surgical consent, monitors and equipment checked, pre-op evaluation and timeout performed Spinal Block Patient position: sitting Prep: DuraPrep Patient monitoring: cardiac monitor, continuous pulse ox and blood pressure Approach: midline Location: L4-5 Injection technique: single-shot Needle Needle type: Pencan  Needle gauge: 24 G Needle length: 9 cm Assessment Sensory level: T10 Events: CSF return and second provider Additional Notes Functioning IV was confirmed and monitors were applied. Sterile prep and drape, including hand hygiene and sterile gloves were used. The patient was positioned and the spine was prepped. The skin was anesthetized with lidocaine.  Previous unsuccessful attempts by CRNA. Free flow of clear CSF was obtained prior to injecting local anesthetic into the CSF.  The spinal needle aspirated freely following injection.  The needle was carefully withdrawn.  The patient tolerated the procedure well.

## 2023-01-21 NOTE — Anesthesia Preprocedure Evaluation (Addendum)
Anesthesia Evaluation  Patient identified by MRN, date of birth, ID band Patient awake    Reviewed: Allergy & Precautions, NPO status , Patient's Chart, lab work & pertinent test results  Airway Mallampati: II  TM Distance: >3 FB Neck ROM: Full    Dental  (+) Upper Dentures   Pulmonary COPD,  COPD inhaler, former smoker, PE   Pulmonary exam normal        Cardiovascular hypertension, Pt. on medications and Pt. on home beta blockers + CAD and + DVT  Normal cardiovascular exam     Neuro/Psych negative neurological ROS  negative psych ROS   GI/Hepatic negative GI ROS, Neg liver ROS,,,  Endo/Other  negative endocrine ROS    Renal/GU negative Renal ROS     Musculoskeletal  (+) Arthritis ,    Abdominal   Peds  Hematology  (+) Blood dyscrasia (Xarelto)   Anesthesia Other Findings Right hip osteoarthritis  Reproductive/Obstetrics                             Anesthesia Physical Anesthesia Plan  ASA: 3  Anesthesia Plan: Spinal   Post-op Pain Management:    Induction:   PONV Risk Score and Plan: 2 and Ondansetron, Dexamethasone and Propofol infusion  Airway Management Planned: Simple Face Mask  Additional Equipment:   Intra-op Plan:   Post-operative Plan:   Informed Consent: I have reviewed the patients History and Physical, chart, labs and discussed the procedure including the risks, benefits and alternatives for the proposed anesthesia with the patient or authorized representative who has indicated his/her understanding and acceptance.     Dental advisory given  Plan Discussed with: CRNA  Anesthesia Plan Comments:        Anesthesia Quick Evaluation

## 2023-01-21 NOTE — Anesthesia Postprocedure Evaluation (Signed)
Anesthesia Post Note  Patient: Kaitlin Arnold  Procedure(s) Performed: TOTAL HIP ARTHROPLASTY ANTERIOR APPROACH (Right: Hip)     Patient location during evaluation: PACU Anesthesia Type: Spinal Level of consciousness: awake Pain management: pain level controlled Vital Signs Assessment: post-procedure vital signs reviewed and stable Respiratory status: spontaneous breathing, nonlabored ventilation and respiratory function stable Cardiovascular status: blood pressure returned to baseline and stable Postop Assessment: no apparent nausea or vomiting Anesthetic complications: no   No notable events documented.  Last Vitals:  Vitals:   01/21/23 1400 01/21/23 1423  BP: 139/77 136/67  Pulse: 76 77  Resp: 14 18  Temp: (!) 36.1 C (!) 36.4 C  SpO2: 96% 96%    Last Pain:  Vitals:   01/21/23 1423  TempSrc: Oral  PainSc: 3                  Rashay Barnette P Keno Caraway

## 2023-01-21 NOTE — Interval H&P Note (Signed)
History and Physical Interval Note:  01/21/2023 8:30 AM  Kaitlin Arnold  has presented today for surgery, with the diagnosis of Right hip osteoarthritis.  The various methods of treatment have been discussed with the patient and family. After consideration of risks, benefits and other options for treatment, the patient has consented to  Procedure(s): TOTAL HIP ARTHROPLASTY ANTERIOR APPROACH (Right) as a surgical intervention.  The patient's history has been reviewed, patient examined, no change in status, stable for surgery.  I have reviewed the patient's chart and labs.  Questions were answered to the patient's satisfaction.     Shelda Pal

## 2023-01-21 NOTE — Op Note (Signed)
NAME:  Kaitlin Arnold NO.: [id number]      MEDICAL RECORD NO.: [id number]      FACILITY:  [facility]      PHYSICIAN:  Shelda Pal  DATE OF BIRTH:  [birth year]     DATE OF PROCEDURE:  01/21/2023                                 OPERATIVE REPORT         PREOPERATIVE DIAGNOSIS: Right  hip osteoarthritis.      POSTOPERATIVE DIAGNOSIS:  Right hip osteoarthritis.      PROCEDURE:  Right total hip replacement through an anterior approach   utilizing DePuy THR system, component size 52 mm pinnacle cup, a size 36+4 neutral   Altrex liner, a size 6 Hi Actis stem with a 26+1.5 Articuleze metal head ball.      SURGEON:  Madlyn Frankel. Charlann Boxer, M.D.      ASSISTANT:  Rosalene Billings, PA-C     ANESTHESIA:  Spinal.      SPECIMENS:  None.      COMPLICATIONS:  None.      BLOOD LOSS:  350 cc     DRAINS:  None.      INDICATION OF THE PROCEDURE:  Kaitlin Arnold is a 87 y.o. female who had   presented to office for evaluation of right hip pain.  Radiographs revealed   progressive degenerative changes with bone-on-bone   articulation of the  hip joint, including subchondral cystic changes and osteophytes.  The patient had painful limited range of   motion significantly affecting their overall quality of life and function.  The patient was failing to    respond to conservative measures including medications and/or injections and activity modification and at this point was ready   to proceed with more definitive measures.  Consent was obtained for   benefit of pain relief.  Specific risks of infection, DVT, component   failure, dislocation, neurovascular injury, and need for revision surgery were reviewed in the office.     PROCEDURE IN DETAIL:  The patient was brought to operative theater.   Once adequate anesthesia, preoperative antibiotics, 2 gm of Ancef, 1 gm of Tranexamic Acid, and 10 mg of Decadron were administered, the patient was positioned supine on the  [employer] table.  Once the patient was safely positioned with adequate padding of boney prominences we predraped out the hip, and used fluoroscopy to confirm orientation of the pelvis.      The right hip was then prepped and draped from proximal iliac crest to   mid thigh with a shower curtain technique.      Time-out was performed identifying the patient, planned procedure, and the appropriate extremity.     An incision was then made 2 cm lateral to the   anterior superior iliac spine extending over the orientation of the   tensor fascia lata muscle and sharp dissection was carried down to the   fascia of the muscle.      The fascia was then incised.  The muscle belly was identified and swept   laterally and retractor placed along the superior neck.  Following   cauterization of the circumflex vessels and removing some pericapsular   fat, a second cobra retractor was placed on the inferior neck.  A T-capsulotomy was made along the line of the   superior neck to the trochanteric fossa, then extended proximally and   distally.  Tag sutures were placed and the retractors were then placed   intracapsular.  We then identified the trochanteric fossa and   orientation of my neck cut and then made a neck osteotomy with the femur on traction.  The femoral   head was removed without difficulty or complication.  Traction was let   off and retractors were placed posterior and anterior around the   acetabulum.      The labrum and foveal tissue were debrided.  I began reaming with a 48 mm   reamer and reamed up to 51 mm reamer with good bony bed preparation and a 52 mm  cup was chosen.  The final 52 mm Pinnacle cup was then impacted under fluoroscopy to confirm the depth of penetration and orientation with respect to   Abduction and forward flexion.  A screw was placed into the ilium followed by the hole eliminator.  The final   36+4 neutral Altrex liner was impacted with good visualized rim fit.  The  cup was positioned anatomically within the acetabular portion of the pelvis.      At this point, the femur was rolled to 100 degrees.  Further capsule was   released off the inferior aspect of the femoral neck.  I then   released the superior capsule proximally.  With the leg in a neutral position the hook was placed laterally   along the femur under the vastus lateralis origin and elevated manually and then held in position using the hook attachment on the bed.  The leg was then extended and adducted with the leg rolled to 100   degrees of external rotation.  Retractors were placed along the medial calcar and posteriorly over the greater trochanter.  Once the proximal femur was fully   exposed, I used a box osteotome to set orientation.  I then began   broaching with the starting chili pepper broach and passed this by hand and then broached up to 6.  With the 6 broach in place I chose a high offset neck and did several trial reductions.  The offset was appropriate, leg lengths   appeared to be equal best matched with the +1.5 head ball trial confirmed radiographically.   Given these findings, I went ahead and dislocated the hip, repositioned all   retractors and positioned the right hip in the extended and abducted position.  The final 6 Hi Actis stem was   chosen and it was impacted down to the level of neck cut.  Based on this   and the trial reductions, a final 36+1.5 Articuleze metal ball was chosen and   impacted onto a clean and dry trunnion, and the hip was reduced.  The   hip had been irrigated throughout the case again at this point.  I did   reapproximate the superior capsular leaflet to the anterior leaflet   using #1 Vicryl.  The fascia of the   tensor fascia lata muscle was then reapproximated using #1 Vicryl and #0 Stratafix sutures.  The   remaining wound was closed with 2-0 Vicryl and running 4-0 Monocryl.   The hip was cleaned, dried, and dressed sterilely using Dermabond and    Aquacel dressing.  The patient was then brought   to recovery room in stable condition tolerating the procedure well.    Rosalene Billings, PA-C  was present for the entirety of the case involved from   preoperative positioning, perioperative retractor management, general   facilitation of the case, as well as primary wound closure as assistant.            Madlyn FrankelMatthew D. Charlann Boxerlin, M.D.        01/21/2023 8:37 AM

## 2023-01-21 NOTE — Evaluation (Signed)
Physical Therapy Evaluation Patient Details Name: Kaitlin Arnold MRN: [id number] DOB: 08-Aug-1936 Today's Date: 01/21/2023  History of Present Illness  87 yo female s/p R THA-DA 01/21/23. Hx of L TKA 2013, PE, R TKA  Clinical Impression  On eval POD 0, pt required Min A for mobility. She walked ~75 feet with a RW. Pain rated 8/10 with activity. Will plan to follow pt and progress activity as tolerated. Plan is for potential d/c home on tomorrow if pain is controlled and pt meets her PT goals.        Recommendations for follow up therapy are one component of a multi-disciplinary discharge planning process, led by the attending physician.  Recommendations may be updated based on patient status, additional functional criteria and insurance authorization.  Follow Up Recommendations       Assistance Recommended at Discharge Intermittent Supervision/Assistance  Patient can return home with the following  A little help with walking and/or transfers;A little help with bathing/dressing/bathroom;Assist for transportation;Assistance with cooking/housework;Help with stairs or ramp for entrance    Equipment Recommendations None recommended by PT  Recommendations for Other Services       Functional Status Assessment Patient has had a recent decline in their functional status and demonstrates the ability to make significant improvements in function in a reasonable and predictable amount of time.     Precautions / Restrictions Restrictions Weight Bearing Restrictions: No Other Position/Activity Restrictions: WBAT      Mobility  Bed Mobility Overal bed mobility: Needs Assistance Bed Mobility: Supine to Sit     Supine to sit: Min guard, HOB elevated     General bed mobility comments: Min guard A. Increased time. Cues provided.    Transfers Overall transfer level: Needs assistance Equipment used: Rolling walker (2 wheels) Transfers: Sit to/from Stand Sit to Stand: Min guard, From elevated  surface           General transfer comment: Min guard A. Cues for safety, hand placement    Ambulation/Gait Ambulation/Gait assistance: Min assist Gait Distance (Feet): 75 Feet Assistive device: Rolling walker (2 wheels) Gait Pattern/deviations: Step-to pattern, Narrow base of support       General Gait Details: Asssit to steady throughout distance. Slow gait speed. Cues for RW proximity, widening of BOS.  Stairs            Wheelchair Mobility    Modified Rankin (Stroke Patients Only)       Balance Overall balance assessment: Needs assistance         Standing balance support: During functional activity, Reliant on assistive device for balance Standing balance-Leahy Scale: Poor                               Pertinent Vitals/Pain Pain Assessment Pain Assessment: 0-10 Pain Score: 8  Pain Location: R hip/thigh Pain Descriptors / Indicators: Burning, Aching, Tightness Pain Intervention(s): Limited activity within patient's tolerance, Repositioned, Patient requesting pain meds-RN notified    Home Living Family/patient expects to be discharged to:: Private residence Living Arrangements: Alone Available Help at Discharge: Family Type of Home: House Home Access: Stairs to enter Entrance Stairs-Rails: Right;Left;Can reach both Entrance Stairs-Number of Steps: 2 Alternate Level Stairs-Number of Steps: 1 flight Home Layout: Two level Home Equipment: Agricultural consultant (2 wheels);Cane - single point      Prior Function Prior Level of Function : Independent/Modified Independent  Hand Dominance        Extremity/Trunk Assessment   Upper Extremity Assessment Upper Extremity Assessment: Overall WFL for tasks assessed    Lower Extremity Assessment Lower Extremity Assessment: Generalized weakness    Cervical / Trunk Assessment Cervical / Trunk Assessment: Normal  Communication   Communication: No difficulties   Cognition Arousal/Alertness: Awake/alert Behavior During Therapy: WFL for tasks assessed/performed Overall Cognitive Status: Within Functional Limits for tasks assessed                                          General Comments      Exercises     Assessment/Plan    PT Assessment Patient needs continued PT services  PT Problem List Decreased strength;Decreased balance;Decreased range of motion;Decreased mobility;Decreased knowledge of use of DME;Decreased activity tolerance;Pain       PT Treatment Interventions DME instruction;Gait training;Functional mobility training;Therapeutic activities;Balance training;Therapeutic exercise;Patient/family education    PT Goals (Current goals can be found in the Care Plan section)  Acute Rehab PT Goals Patient Stated Goal: less pain PT Goal Formulation: With patient Time For Goal Achievement: 02/04/23 Potential to Achieve Goals: Good    Frequency 7X/week     Co-evaluation               AM-PAC PT "6 Clicks" Mobility  Outcome Measure Help needed turning from your back to your side while in a flat bed without using bedrails?: A Little Help needed moving from lying on your back to sitting on the side of a flat bed without using bedrails?: A Little Help needed moving to and from a bed to a chair (including a wheelchair)?: A Little Help needed standing up from a chair using your arms (e.g., wheelchair or bedside chair)?: A Little Help needed to walk in hospital room?: A Little Help needed climbing 3-5 steps with a railing? : A Little 6 Click Score: 18    End of Session Equipment Utilized During Treatment: Gait belt Activity Tolerance: Patient tolerated treatment well;Patient limited by pain Patient left: in chair;with call bell/phone within reach;with family/visitor present   PT Visit Diagnosis: Other abnormalities of gait and mobility (R26.89)    Time: 6812-7517 PT Time Calculation (min) (ACUTE ONLY): 19  min   Charges:   PT Evaluation $PT Eval Low Complexity: 1 Low        Faye Ramsay, PT Acute Rehabilitation  Office: [phone]

## 2023-01-21 NOTE — Discharge Instructions (Signed)

## 2023-01-21 NOTE — Transfer of Care (Signed)
Immediate Anesthesia Transfer of Care Note  Patient: Kaitlin Arnold  Procedure(s) Performed: Procedure(s): TOTAL HIP ARTHROPLASTY ANTERIOR APPROACH (Right)  Patient Location: PACU  Anesthesia Type:Spinal  Level of Consciousness: awake, alert  and oriented  Airway & Oxygen Therapy: Patient Spontanous Breathing  Post-op Assessment: Report given to RN and Post -op Vital signs reviewed and stable  Post vital signs: Reviewed and stable  Last Vitals:  Vitals:   01/21/23 0832  BP: 130/75  Pulse: 73  Resp: 18  Temp: 36.8 C  SpO2: 95%    Complications: No apparent anesthesia complications

## 2023-01-22 ENCOUNTER — Encounter (HOSPITAL_COMMUNITY): Payer: Self-pay | Admitting: Orthopedic Surgery

## 2023-01-22 DIAGNOSIS — J449 Chronic obstructive pulmonary disease, unspecified: Secondary | ICD-10-CM | POA: Diagnosis not present

## 2023-01-22 DIAGNOSIS — M1611 Unilateral primary osteoarthritis, right hip: Secondary | ICD-10-CM | POA: Diagnosis not present

## 2023-01-22 DIAGNOSIS — I10 Essential (primary) hypertension: Secondary | ICD-10-CM | POA: Diagnosis not present

## 2023-01-22 DIAGNOSIS — I2510 Atherosclerotic heart disease of native coronary artery without angina pectoris: Secondary | ICD-10-CM | POA: Diagnosis not present

## 2023-01-22 DIAGNOSIS — Z86711 Personal history of pulmonary embolism: Secondary | ICD-10-CM | POA: Diagnosis not present

## 2023-01-22 DIAGNOSIS — Z86718 Personal history of other venous thrombosis and embolism: Secondary | ICD-10-CM | POA: Diagnosis not present

## 2023-01-22 LAB — BASIC METABOLIC PANEL
Anion gap: 8 (ref 5–15)
BUN: 28 mg/dL — ABNORMAL HIGH (ref 8–23)
CO2: 22 mmol/L (ref 22–32)
Calcium: 8.7 mg/dL — ABNORMAL LOW (ref 8.9–10.3)
Chloride: 103 mmol/L (ref 98–111)
Creatinine, Ser: 1.04 mg/dL — ABNORMAL HIGH (ref 0.44–1.00)
GFR, Estimated: 53 mL/min — ABNORMAL LOW (ref >60)
Glucose, Bld: 155 mg/dL — ABNORMAL HIGH (ref 70–99)
Potassium: 3.7 mmol/L (ref 3.5–5.1)
Sodium: 133 mmol/L — ABNORMAL LOW (ref 135–145)

## 2023-01-22 LAB — CBC
HCT: 32.5 % — ABNORMAL LOW (ref 36.0–46.0)
Hemoglobin: 10.2 g/dL — ABNORMAL LOW (ref 12.0–15.0)
MCH: 27.1 pg (ref 26.0–34.0)
MCHC: 31.4 g/dL (ref 30.0–36.0)
MCV: 86.2 fL (ref 80.0–100.0)
Platelets: 208 10*3/uL (ref 150–400)
RBC: 3.77 MIL/uL — ABNORMAL LOW (ref 3.87–5.11)
RDW: 14.6 % (ref 11.5–15.5)
WBC: 10.5 10*3/uL (ref 4.0–10.5)
nRBC: 0 % (ref 0.0–0.2)

## 2023-01-22 MED ORDER — HYDROCHLOROTHIAZIDE 25 MG PO TABS
25.0000 mg | ORAL_TABLET | ORAL | Status: DC
  Administered 2023-01-22: 25 mg via ORAL
  Filled 2023-01-22: qty 1

## 2023-01-22 MED ORDER — LOSARTAN POTASSIUM 50 MG PO TABS
100.0000 mg | ORAL_TABLET | ORAL | Status: DC
  Administered 2023-01-22: 100 mg via ORAL
  Filled 2023-01-22: qty 2

## 2023-01-22 MED ORDER — METHOCARBAMOL 500 MG PO TABS: 500.0000 mg | ORAL_TABLET | Freq: Four times a day (QID) | ORAL | 2 refills | Status: AC | PRN

## 2023-01-22 MED ORDER — OXYCODONE HCL 5 MG PO TABS: 5.0000 mg | ORAL_TABLET | ORAL | 0 refills | Status: DC | PRN

## 2023-01-22 MED ORDER — POLYETHYLENE GLYCOL 3350 17 G PO PACK: 17.0000 g | PACK | Freq: Two times a day (BID) | ORAL | 0 refills | Status: DC

## 2023-01-22 MED ORDER — SENNA 8.6 MG PO TABS: 2.0000 | ORAL_TABLET | Freq: Every day | ORAL | 0 refills | Status: AC

## 2023-01-22 NOTE — TOC Transition Note (Signed)
Transition of Care [facility]) - CM/SW Discharge Note  Patient Details  Name: Kaitlin Arnold MRN: [id number] Date of Birth: 06-22-1936  [facility] of [facility]) CM/SW Contact:  Ewing Schlein, LCSW Phone Number: 01/22/2023, 9:35 AM  Clinical Narrative: Patient is expected to discharge home after working with PT. CSW met with patient to review discharge plan. Patient will go home with a home exercise program (HEP). Patient has a rolling walker at home, so there are no DME needs at this time. TOC signing off.  Final next level of care: Home/Self Care Barriers to Discharge: No Barriers Identified  Patient Goals and CMS Choice Choice offered to / list presented to : NA  Discharge Plan and Services Additional resources added to the After Visit Summary for       DME Arranged: N/A DME Agency: NA  Social Determinants of Health (SDOH) Interventions SDOH Screenings   Food Insecurity: No Food Insecurity (01/21/2023)  Housing: Low Risk  (01/21/2023)  Transportation Needs: No Transportation Needs (01/21/2023)  Utilities: Not At Risk (01/21/2023)  Tobacco Use: Medium Risk (01/21/2023)   Readmission Risk Interventions     No data to display

## 2023-01-22 NOTE — Progress Notes (Signed)
Physical Therapy Treatment Patient Details Name: Kaitlin Arnold MRN: [id number] DOB: [birth year] Today's Date: 01/22/2023   History of Present Illness 87 yo female s/p R THA-DA 01/21/23. Hx of L TKA 2013, PE, R TKA    PT Comments    Pt having trouble urinating prior to d/c. Pt wanting to mobilize to help hip stiffness and to see if that will help increase urge to urinate. Supv level for mobility. Increased time. She has some success in the bathroom. Pt also feels she may want something for pain prior to d/c-made RN aware. Pt hopeful to d/c home soon.     Recommendations for follow up therapy are one component of a multi-disciplinary discharge planning process, led by the attending physician.  Recommendations may be updated based on patient status, additional functional criteria and insurance authorization.  Follow Up Recommendations       Assistance Recommended at Discharge Intermittent Supervision/Assistance  Patient can return home with the following A little help with walking and/or transfers;A little help with bathing/dressing/bathroom;Assist for transportation;Assistance with cooking/housework;Help with stairs or ramp for entrance   Equipment Recommendations  None recommended by PT    Recommendations for Other Services       Precautions / Restrictions Precautions Precautions: Fall Restrictions Weight Bearing Restrictions: No Other Position/Activity Restrictions: WBAT     Mobility  Bed Mobility               General bed mobility comments: oob in recliner    Transfers Overall transfer level: Needs assistance Equipment used: Rolling walker (2 wheels) Transfers: Sit to/from Stand Sit to Stand: Supervision           General transfer comment: Supv for safety, hand placement. Increased time.    Ambulation/Gait Ambulation/Gait assistance: Supervision Gait Distance (Feet): 145 Feet Assistive device: Rolling walker (2 wheels) Gait Pattern/deviations:  Step-through pattern, Decreased stride length Gait velocity: decreased     General Gait Details: Supv for safety. Slow gait speed.   Stairs        Wheelchair Mobility    Modified Rankin (Stroke Patients Only)       Balance Overall balance assessment: Needs assistance         Standing balance support: During functional activity, Reliant on assistive device for balance Standing balance-Leahy Scale: Fair                              Cognition Arousal/Alertness: Awake/alert Behavior During Therapy: WFL for tasks assessed/performed Overall Cognitive Status: Within Functional Limits for tasks assessed                                          Exercises     General Comments        Pertinent Vitals/Pain Pain Assessment Pain Assessment: Faces Pain Score: 4  Faces Pain Scale: Hurts even more Pain Location: R hip/thigh Pain Descriptors / Indicators: Burning, Aching, Tightness Pain Intervention(s): Limited activity within patient's tolerance, Monitored during session, Repositioned, Ice applied, Patient requesting pain meds-RN notified    Home Living                          Prior Function            PT Goals (current goals can now be found in the care plan section) Progress  towards PT goals: Progressing toward goals    Frequency    7X/week      PT Plan Current plan remains appropriate    Co-evaluation              AM-PAC PT "6 Clicks" Mobility   Outcome Measure  Help needed turning from your back to your side while in a flat bed without using bedrails?: A Little Help needed moving from lying on your back to sitting on the side of a flat bed without using bedrails?: A Little Help needed moving to and from a bed to a chair (including a wheelchair)?: A Little Help needed standing up from a chair using your arms (e.g., wheelchair or bedside chair)?: A Little Help needed to walk in hospital room?: A  Little Help needed climbing 3-5 steps with a railing? : A Little 6 Click Score: 18    End of Session Equipment Utilized During Treatment: Gait belt Activity Tolerance: Patient tolerated treatment well Patient left: in chair;with call bell/phone within reach;with family/visitor present   PT Visit Diagnosis: Other abnormalities of gait and mobility (R26.89)     Time: 0034-9179 PT Time Calculation (min) (ACUTE ONLY): 10 min  Charges:  $Gait Training: 8-22 mins                         Faye Ramsay, PT Acute Rehabilitation  Office: [phone]

## 2023-01-22 NOTE — Progress Notes (Addendum)
Physical Therapy Treatment Patient Details Name: Kaitlin Arnold MRN: [id number] DOB: January 30, 1936 Today's Date: 01/22/2023   History of Present Illness 87 yo female s/p R THA-DA 01/21/23. Hx of L TKA 2013, PE, R TKA    PT Comments    Pt agreeable to therapy. Reviewed/practiced exercises, gait training, and stair training. Audible wheezing and dyspnea with activity- O2 93% on RA, HR 85 bpm-made RN aware.  Issued HEP for pt to perform 2x/day. Encouraged pt to ambulate often at home, as tolerated. No further question from patient or daughter (caregiver for next few days). All PT education completed.     Recommendations for follow up therapy are one component of a multi-disciplinary discharge planning process, led by the attending physician.  Recommendations may be updated based on patient status, additional functional criteria and insurance authorization.  Follow Up Recommendations       Assistance Recommended at Discharge Intermittent Supervision/Assistance  Patient can return home with the following A little help with walking and/or transfers;A little help with bathing/dressing/bathroom;Assist for transportation;Assistance with cooking/housework;Help with stairs or ramp for entrance   Equipment Recommendations  None recommended by PT    Recommendations for Other Services       Precautions / Restrictions Precautions Precautions: Fall Restrictions Weight Bearing Restrictions: No Other Position/Activity Restrictions: WBAT     Mobility  Bed Mobility               General bed mobility comments: oob in recliner    Transfers Overall transfer level: Needs assistance Equipment used: Rolling walker (2 wheels) Transfers: Sit to/from Stand Sit to Stand: Supervision           General transfer comment: Supv for safety, hand placement. Increased time.    Ambulation/Gait Ambulation/Gait assistance: Supervision Gait Distance (Feet): 125 Feet Assistive device: Rolling walker (2  wheels) Gait Pattern/deviations: Step-through pattern, Decreased stride length Gait velocity: decreased     General Gait Details: Supv for safety. Slow gait speed.   Stairs Stairs: Yes Stairs assistance: Supervision Stair Management: Step to pattern, One rail Right, Two rails Number of Stairs: 9 General stair comments: Practiced x 1 3 steps with 2 rails. Practiced x 1 6 steps with 2 hands on 1 rail. Cues for safety, technique, sequencing. Pt was fatigued and wheezing. Seated rest break taken. Discussed pt possibly staying on 1st floor for a few days if she feels she cannot tolerate getting up/down 13 stairs to her bedroom.   Wheelchair Mobility    Modified Rankin (Stroke Patients Only)       Balance Overall balance assessment: Needs assistance         Standing balance support: During functional activity, Reliant on assistive device for balance Standing balance-Leahy Scale: Fair                              Cognition Arousal/Alertness: Awake/alert Behavior During Therapy: WFL for tasks assessed/performed Overall Cognitive Status: Within Functional Limits for tasks assessed                                          Exercises Total Joint Exercises Ankle Circles/Pumps: AROM, Both, 10 reps Quad Sets: AROM, Right, 10 reps Heel Slides: AAROM, Right, 10 reps Hip ABduction/ADduction: AAROM, Right, 10 reps, Standing, Seated Long Arc Quad: AROM, Right, 10 reps, Seated Knee Flexion: AROM, Right, 10  reps, Standing Marching in Standing: AROM, Both, 10 reps, Standing General Exercises - Lower Extremity Heel Raises: AROM, Both, 10 reps, Standing    General Comments        Pertinent Vitals/Pain Pain Assessment Pain Assessment: 0-10 Pain Score: 4  Pain Location: R hip/thigh Pain Descriptors / Indicators: Burning, Aching, Tightness Pain Intervention(s): Monitored during session, Ice applied    Home Living                           Prior Function            PT Goals (current goals can now be found in the care plan section) Progress towards PT goals: Progressing toward goals    Frequency    7X/week      PT Plan Current plan remains appropriate    Co-evaluation              AM-PAC PT "6 Clicks" Mobility   Outcome Measure  Help needed turning from your back to your side while in a flat bed without using bedrails?: A Little Help needed moving from lying on your back to sitting on the side of a flat bed without using bedrails?: A Little Help needed moving to and from a bed to a chair (including a wheelchair)?: A Little Help needed standing up from a chair using your arms (e.g., wheelchair or bedside chair)?: A Little Help needed to walk in hospital room?: A Little Help needed climbing 3-5 steps with a railing? : A Little 6 Click Score: 18    End of Session Equipment Utilized During Treatment: Gait belt Activity Tolerance: Patient tolerated treatment well Patient left: in chair;with call bell/phone within reach;with family/visitor present   PT Visit Diagnosis: Other abnormalities of gait and mobility (R26.89)     Time: 1610-9604 PT Time Calculation (min) (ACUTE ONLY): 28 min  Charges:  $Gait Training: 8-22 mins $Therapeutic Exercise: 8-22 mins                         Faye Ramsay, PT Acute Rehabilitation  Office: [phone]

## 2023-01-22 NOTE — Progress Notes (Signed)
   Subjective: 1 Day Post-Op Procedure(s) (LRB): TOTAL HIP ARTHROPLASTY ANTERIOR APPROACH (Right) Patient reports pain as mild.   Patient seen in rounds for Dr. Charlann Boxer. Patient is well, and has had no acute complaints or problems. No acute events overnight. Foley catheter removed. Patient ambulated 75 feet with PT.  We will continue therapy today.   Objective: Vital signs in last 24 hours: Temp:  [97 F (36.1 C)-98.3 F (36.8 C)] 97.9 F (36.6 C) (04/10 0507) Pulse Rate:  [60-83] 76 (04/10 0507) Resp:  [10-25] 17 (04/10 0507) BP: (109-140)/(63-83) 109/83 (04/10 0507) SpO2:  [93 %-100 %] 95 % (04/10 0507) Weight:  [99.8 kg] 99.8 kg (04/09 1423)  Intake/Output from previous day:  Intake/Output Summary (Last 24 hours) at 01/22/2023 0820 Last data filed at 01/22/2023 0615 Gross per 24 hour  Intake 2414.87 ml  Output 1797 ml  Net 617.87 ml     Intake/Output this shift: No intake/output data recorded.  Labs: Recent Labs    01/22/23 0345  HGB 10.2*   Recent Labs    01/22/23 0345  WBC 10.5  RBC 3.77*  HCT 32.5*  PLT 208   Recent Labs    01/22/23 0345  NA 133*  K 3.7  CL 103  CO2 22  BUN 28*  CREATININE 1.04*  GLUCOSE 155*  CALCIUM 8.7*   No results for input(s): "LABPT", "INR" in the last 72 hours.  Exam: General - Patient is Alert and Oriented Extremity - Neurologically intact Sensation intact distally Intact pulses distally Dorsiflexion/Plantar flexion intact Dressing - dressing C/D/I Motor Function - intact, moving foot and toes well on exam.   Past Medical History:  Diagnosis Date   Aortic atherosclerosis 12/04/2018   Arthritis    oa and pain left knee;  previous right total knee replacement  1998   CAD (coronary artery disease) 12/04/2018   Chest pain 12/18/2018   COPD (chronic obstructive pulmonary disease)    GERD (gastroesophageal reflux disease)    seldom   H/O cardiovascular stress test 05/27/2012   STRESS TEST WAS DONE for cardiac  clearance for left total knee replacement--given clearance by dr. Jacinto Halim.   EF 71%, NO ISCHEMIA   Hyperlipidemia    Hypertension    Shortness of breath    with exertion   SOB (shortness of breath) 12/18/2018    Assessment/Plan: 1 Day Post-Op Procedure(s) (LRB): TOTAL HIP ARTHROPLASTY ANTERIOR APPROACH (Right) Principal Problem:   S/P total right hip arthroplasty  Estimated body mass index is 36.61 kg/m as calculated from the following:   Height as of this encounter: 5\' 5"  (1.651 m).   Weight as of this encounter: 99.8 kg. Advance diet Up with therapy D/C IV fluids    DVT Prophylaxis - Xarelto Weight bearing as tolerated.  Hgb stable at 10.2 this AM.  Plan is to go Home after hospital stay. Plan for discharge today following 1-2 sessions of PT as long as they are meeting their goals.  Follow up in the office in 2 weeks.   Dennie Bible, PA-C Orthopedic Surgery [phone] 01/22/2023, 8:20 AM

## 2023-01-28 ENCOUNTER — Ambulatory Visit: Payer: Medicare Other | Admitting: Hematology and Oncology

## 2023-01-30 NOTE — Discharge Summary (Signed)
Patient ID: Kaitlin Arnold MRN: [id number] DOB/AGE: [birth year] 87 y.o.  Admit date: 01/21/2023 Discharge date: 01/22/2023  Admission Diagnoses:  Right hip osteoarthritis  Discharge Diagnoses:  Principal Problem:   S/P total right hip arthroplasty   Past Medical History:  Diagnosis Date   Aortic atherosclerosis 12/04/2018   Arthritis    oa and pain left knee;  previous right total knee replacement  1998   CAD (coronary artery disease) 12/04/2018   Chest pain 12/18/2018   COPD (chronic obstructive pulmonary disease)    GERD (gastroesophageal reflux disease)    seldom   H/O cardiovascular stress test 05/27/2012   STRESS TEST WAS DONE for cardiac clearance for left total knee replacement--given clearance by dr. Jacinto Halim.   EF 71%, NO ISCHEMIA   Hyperlipidemia    Hypertension    Shortness of breath    with exertion   SOB (shortness of breath) 12/18/2018    Surgeries: Procedure(s): TOTAL HIP ARTHROPLASTY ANTERIOR APPROACH on 01/21/2023   Consultants:   Discharged Condition: Improved  Hospital Course: Kaitlin Arnold is an 87 y.o. female who was admitted 01/21/2023 for operative treatment ofS/P total right hip arthroplasty. Patient has severe unremitting pain that affects sleep, daily activities, and work/hobbies. After pre-op clearance the patient was taken to the operating room on 01/21/2023 and underwent  Procedure(s): TOTAL HIP ARTHROPLASTY ANTERIOR APPROACH.    Patient was given perioperative antibiotics:  Anti-infectives (From admission, onward)    Start     Dose/Rate Route Frequency Ordered Stop   01/21/23 1600  ceFAZolin (ANCEF) IVPB 2g/100 mL premix        2 g 200 mL/hr over 30 Minutes Intravenous Every 6 hours 01/21/23 1426 01/21/23 2206   01/21/23 0800  ceFAZolin (ANCEF) IVPB 2g/100 mL premix        2 g 200 mL/hr over 30 Minutes Intravenous On call to O.R. 01/21/23 0755 01/21/23 1015        Patient was given sequential compression devices, early ambulation, and  chemoprophylaxis to prevent DVT. Patient worked with PT and was meeting their goals regarding safe ambulation and transfers.  Patient benefited maximally from hospital stay and there were no complications.    Recent vital signs: No data found.   Recent laboratory studies: No results for input(s): "WBC", "HGB", "HCT", "PLT", "NA", "K", "CL", "CO2", "BUN", "CREATININE", "GLUCOSE", "INR", "CALCIUM" in the last 72 hours.  Invalid input(s): "PT", "2"   Discharge Medications:   Allergies as of 01/22/2023   No Known Allergies      Medication List     TAKE these medications    acetaminophen 325 MG tablet Commonly known as: TYLENOL Take 650 mg by mouth every 6 (six) hours as needed for moderate pain.   albuterol 108 (90 Base) MCG/ACT inhaler Commonly known as: ProAir HFA Inhale 2 puffs into the lungs every 6 (six) hours as needed for wheezing or shortness of breath.   amLODipine 5 MG tablet Commonly known as: NORVASC Take 5 mg by mouth daily.   amLODipine 10 MG tablet Commonly known as: NORVASC TAKE 1 TABLET BY MOUTH ONCE DAILY IN THE MORNING   atorvastatin 20 MG tablet Commonly known as: LIPITOR Take 20 mg by mouth every evening.   Flonase Allergy Relief 50 MCG/ACT nasal spray Generic drug: fluticasone Place 1 spray into both nostrils daily as needed for allergies.   hydrochlorothiazide 25 MG tablet Commonly known as: HYDRODIURIL Take 25 mg by mouth daily.   labetalol 200 MG tablet Commonly known as:  NORMODYNE Take 1 tablet by mouth twice daily What changed: how much to take   losartan 100 MG tablet Commonly known as: COZAAR Take 100 mg by mouth daily.   methocarbamol 500 MG tablet Commonly known as: ROBAXIN Take 1 tablet (500 mg total) by mouth every 6 (six) hours as needed for muscle spasms.   oxyCODONE 5 MG immediate release tablet Commonly known as: Oxy IR/ROXICODONE Take 1 tablet (5 mg total) by mouth every 4 (four) hours as needed for severe pain.    Polyethyl Glycol-Propyl Glycol 0.4-0.3 % Soln Apply 1 drop to eye 3 (three) times daily as needed (dry eyes).   polyethylene glycol 17 g packet Commonly known as: MIRALAX / GLYCOLAX Take 17 g by mouth 2 (two) times daily.   rivaroxaban 20 MG Tabs tablet Commonly known as: Xarelto Take 1 tablet (20 mg total) by mouth daily with supper.   senna 8.6 MG Tabs tablet Commonly known as: SENOKOT Take 2 tablets (17.2 mg total) by mouth at bedtime for 14 days.   Simbrinza 1-0.2 % Susp Generic drug: Brinzolamide-Brimonidine Place 1 drop into both eyes in the morning and at bedtime.   Stiolto Respimat 2.5-2.5 MCG/ACT Aers Generic drug: Tiotropium Bromide-Olodaterol INHALE 2 PUFFS BY MOUTH ONCE DAILY   triamcinolone cream 0.1 % Commonly known as: KENALOG Apply 1 application topically 2 (two) times daily as needed (dermatitis).   Vitamin D 50 MCG (2000 UT) tablet Take 2,000 Units by mouth daily.               Discharge Care Instructions  (From admission, onward)           Start     Ordered   01/22/23 0000  Change dressing       Comments: Maintain surgical dressing until follow up in the clinic. If the edges start to pull up, may reinforce with tape. If the dressing is no longer working, may remove and cover with gauze and tape, but must keep the area dry and clean.  Call with any questions or concerns.   01/22/23 0823            Diagnostic Studies: DG Pelvis Portable  Result Date: 01/21/2023 CLINICAL DATA:  Postoperative right hip surgery. EXAM: PORTABLE PELVIS 1-2 VIEWS COMPARISON:  CT abdomen and pelvis 04/30/2021 FINDINGS: Single frontal view of the bilateral hips, centered on the right hip. Interval total right hip arthroplasty. No perihardware lucency is seen to indicate hardware failure or loosening. Expected postoperative lateral hip subcutaneous air. Moderate to severe superomedial left femoroacetabular joint space narrowing with moderate peripheral degenerative  osteophytes moderate pubic symphysis joint space narrowing. No acute fracture dislocation. IMPRESSION: Interval total right hip arthroplasty without evidence of hardware failure. Electronically Signed   By: Neita Garnet M.D.   On: 01/21/2023 11:58   DG HIP UNILAT WITH PELVIS 2-3 VIEWS RIGHT  Result Date: 01/21/2023 CLINICAL DATA:  [id number] Surgery, elective [id number] EXAM: DG HIP (WITH OR WITHOUT PELVIS) 2-3V RIGHT COMPARISON:  None Available. FINDINGS: Intraoperative images during right hip arthroplasty. Normal alignment. IMPRESSION: Intraoperative images during right hip arthroplasty. Normal alignment. Electronically Signed   By: Caprice Renshaw M.D.   On: 01/21/2023 11:49   DG C-Arm 1-60 Min-No Report  Result Date: 01/21/2023 Fluoroscopy was utilized by the requesting physician.  No radiographic interpretation.    Disposition: Discharge disposition: 01-Home or Self Care       Discharge Instructions     Call MD / Call 911   Complete by:  As directed    If you experience chest pain or shortness of breath, CALL 911 and be transported to the hospital emergency room.  If you develope a fever above 101 F, pus (white drainage) or increased drainage or redness at the wound, or calf pain, call your surgeon's office.   Change dressing   Complete by: As directed    Maintain surgical dressing until follow up in the clinic. If the edges start to pull up, may reinforce with tape. If the dressing is no longer working, may remove and cover with gauze and tape, but must keep the area dry and clean.  Call with any questions or concerns.   Constipation Prevention   Complete by: As directed    Drink plenty of fluids.  Prune juice may be helpful.  You may use a stool softener, such as Colace (over the counter) 100 mg twice a day.  Use MiraLax (over the counter) for constipation as needed.   Diet - low sodium heart healthy   Complete by: As directed    Increase activity slowly as tolerated   Complete by: As  directed    Weight bearing as tolerated with assist device (walker, cane, etc) as directed, use it as long as suggested by your surgeon or therapist, typically at least 4-6 weeks.   Post-operative opioid taper instructions:   Complete by: As directed    POST-OPERATIVE OPIOID TAPER INSTRUCTIONS: It is important to wean off of your opioid medication as soon as possible. If you do not need pain medication after your surgery it is ok to stop day one. Opioids include: Codeine, Hydrocodone(Norco, Vicodin), Oxycodone(Percocet, oxycontin) and hydromorphone amongst others.  Long term and even short term use of opiods can cause: Increased pain response Dependence Constipation Depression Respiratory depression And more.  Withdrawal symptoms can include Flu like symptoms Nausea, vomiting And more Techniques to manage these symptoms Hydrate well Eat regular healthy meals Stay active Use relaxation techniques(deep breathing, meditating, yoga) Do Not substitute Alcohol to help with tapering If you have been on opioids for less than two weeks and do not have pain than it is ok to stop all together.  Plan to wean off of opioids This plan should start within one week post op of your joint replacement. Maintain the same interval or time between taking each dose and first decrease the dose.  Cut the total daily intake of opioids by one tablet each day Next start to increase the time between doses. The last dose that should be eliminated is the evening dose.      TED hose   Complete by: As directed    Use stockings (TED hose) for 2 weeks on both leg(s).  You may remove them at night for sleeping.        Follow-up Information     Durene Romans, MD. Go on 02/05/2023.   Specialty: Orthopedic Surgery Why: You are scheduled for follow up appt on Wednesday April 24 at 10:00am. Contact information: [address] [place] [address] [phone]                   Signed: Cassandria Anger 01/30/2023, 7:13 AM

## 2023-02-04 DIAGNOSIS — I700 Atherosclerosis of aorta: Secondary | ICD-10-CM | POA: Diagnosis not present

## 2023-02-04 DIAGNOSIS — N182 Chronic kidney disease, stage 2 (mild): Secondary | ICD-10-CM | POA: Diagnosis not present

## 2023-02-04 DIAGNOSIS — D692 Other nonthrombocytopenic purpura: Secondary | ICD-10-CM | POA: Diagnosis not present

## 2023-02-04 DIAGNOSIS — D6869 Other thrombophilia: Secondary | ICD-10-CM | POA: Diagnosis not present

## 2023-02-04 DIAGNOSIS — I10 Essential (primary) hypertension: Secondary | ICD-10-CM | POA: Diagnosis not present

## 2023-02-04 DIAGNOSIS — I872 Venous insufficiency (chronic) (peripheral): Secondary | ICD-10-CM | POA: Diagnosis not present

## 2023-02-04 DIAGNOSIS — M150 Primary generalized (osteo)arthritis: Secondary | ICD-10-CM | POA: Diagnosis not present

## 2023-02-04 DIAGNOSIS — Z7901 Long term (current) use of anticoagulants: Secondary | ICD-10-CM | POA: Diagnosis not present

## 2023-02-04 DIAGNOSIS — R7303 Prediabetes: Secondary | ICD-10-CM | POA: Diagnosis not present

## 2023-02-04 DIAGNOSIS — E782 Mixed hyperlipidemia: Secondary | ICD-10-CM | POA: Diagnosis not present

## 2023-02-04 DIAGNOSIS — G609 Hereditary and idiopathic neuropathy, unspecified: Secondary | ICD-10-CM | POA: Diagnosis not present

## 2023-02-15 NOTE — Progress Notes (Signed)
Patient Care Team: Georgianne Fick, MD as PCP - General (Internal Medicine)  DIAGNOSIS: No diagnosis found.  SUMMARY OF ONCOLOGIC HISTORY: Oncology History   No history exists.    CHIEF COMPLIANT: Follow-up of PE and DVT on Xarelto    INTERVAL HISTORY: Kaitlin Arnold is a 87 y.o. with above-mentioned history of PE and DVT currently on anticoagulation with Xarelto. She presents to the clinic today for follow-up.    ALLERGIES:  has No Known Allergies.  MEDICATIONS:  Current Outpatient Medications  Medication Sig Dispense Refill   acetaminophen (TYLENOL) 325 MG tablet Take 650 mg by mouth every 6 (six) hours as needed for moderate pain.      albuterol (PROAIR HFA) 108 (90 Base) MCG/ACT inhaler Inhale 2 puffs into the lungs every 6 (six) hours as needed for wheezing or shortness of breath. 18 g 5   amLODipine (NORVASC) 10 MG tablet TAKE 1 TABLET BY MOUTH ONCE DAILY IN THE MORNING 90 tablet 1   amLODipine (NORVASC) 5 MG tablet Take 5 mg by mouth daily.     atorvastatin (LIPITOR) 20 MG tablet Take 20 mg by mouth every evening.     Cholecalciferol (VITAMIN D) 50 MCG (2000 UT) tablet Take 2,000 Units by mouth daily.     fluticasone (FLONASE ALLERGY RELIEF) 50 MCG/ACT nasal spray Place 1 spray into both nostrils daily as needed for allergies.     hydrochlorothiazide (HYDRODIURIL) 25 MG tablet Take 25 mg by mouth daily.     labetalol (NORMODYNE) 200 MG tablet Take 1 tablet by mouth twice daily (Patient taking differently: Take 100 mg by mouth 2 (two) times daily.) 180 tablet 1   losartan (COZAAR) 100 MG tablet Take 100 mg by mouth daily.     methocarbamol (ROBAXIN) 500 MG tablet Take 1 tablet (500 mg total) by mouth every 6 (six) hours as needed for muscle spasms. 40 tablet 2   oxyCODONE (OXY IR/ROXICODONE) 5 MG immediate release tablet Take 1 tablet (5 mg total) by mouth every 4 (four) hours as needed for severe pain. 42 tablet 0   Polyethyl Glycol-Propyl Glycol 0.4-0.3 % SOLN Apply 1  drop to eye 3 (three) times daily as needed (dry eyes).      polyethylene glycol (MIRALAX / GLYCOLAX) 17 g packet Take 17 g by mouth 2 (two) times daily. 14 each 0   rivaroxaban (XARELTO) 20 MG TABS tablet Take 1 tablet (20 mg total) by mouth daily with supper. 30 tablet 2   SIMBRINZA 1-0.2 % SUSP Place 1 drop into both eyes in the morning and at bedtime.     STIOLTO RESPIMAT 2.5-2.5 MCG/ACT AERS INHALE 2 PUFFS BY MOUTH ONCE DAILY 4 g 0   triamcinolone cream (KENALOG) 0.1 % Apply 1 application topically 2 (two) times daily as needed (dermatitis).      No current facility-administered medications for this visit.    PHYSICAL EXAMINATION: ECOG PERFORMANCE STATUS: {CHL ONC ECOG [phone]}  There were no vitals filed for this visit. There were no vitals filed for this visit.  BREAST:*** No palpable masses or nodules in either right or left breasts. No palpable axillary supraclavicular or infraclavicular adenopathy no breast tenderness or nipple discharge. (exam performed in the presence of a chaperone)  LABORATORY DATA:  I have reviewed the data as listed    Latest Ref Rng & Units 01/22/2023    3:45 AM 01/09/2023   10:30 AM 11/23/2021    1:02 PM  CMP  Glucose 70 - 99 mg/dL  155  121  165   BUN 8 - 23 mg/dL 28  17  20    Creatinine 0.44 - 1.00 mg/dL 1.61  0.96  0.45   Sodium 135 - 145 mmol/L 133  138  135   Potassium 3.5 - 5.1 mmol/L 3.7  3.7  3.5   Chloride 98 - 111 mmol/L 103  103  104   CO2 22 - 32 mmol/L 22  27  24    Calcium 8.9 - 10.3 mg/dL 8.7  9.3  9.4   Total Protein 6.5 - 8.1 g/dL   7.3   Total Bilirubin 0.3 - 1.2 mg/dL   0.5   Alkaline Phos 38 - 126 U/L   95   AST 15 - 41 U/L   17   ALT 0 - 44 U/L   18     Lab Results  Component Value Date   WBC 10.5 01/22/2023   HGB 10.2 (L) 01/22/2023   HCT 32.5 (L) 01/22/2023   MCV 86.2 01/22/2023   PLT 208 01/22/2023   NEUTROABS 6.3 11/23/2021    ASSESSMENT & PLAN:  No problem-specific Assessment & Plan notes found for this  encounter.    No orders of the defined types were placed in this encounter.  The patient has a good understanding of the overall plan. she agrees with it. she will call with any problems that may develop before the next visit here. Total time spent: 30 mins including face to face time and time spent for planning, charting and co-ordination of care   Sherlyn Lick, CMA 02/15/23    I Janan Ridge am acting as a Neurosurgeon for [employer]  ***

## 2023-02-18 ENCOUNTER — Inpatient Hospital Stay: Payer: Medicare Other | Attending: Hematology and Oncology | Admitting: Hematology and Oncology

## 2023-02-18 ENCOUNTER — Other Ambulatory Visit: Payer: Self-pay

## 2023-02-18 VITALS — BP 146/58 | HR 77 | Temp 97.2°F | Resp 18 | Ht 65.0 in | Wt 218.5 lb

## 2023-02-18 DIAGNOSIS — Z86711 Personal history of pulmonary embolism: Secondary | ICD-10-CM | POA: Insufficient documentation

## 2023-02-18 DIAGNOSIS — Z86718 Personal history of other venous thrombosis and embolism: Secondary | ICD-10-CM | POA: Insufficient documentation

## 2023-02-18 DIAGNOSIS — I2699 Other pulmonary embolism without acute cor pulmonale: Secondary | ICD-10-CM

## 2023-02-18 DIAGNOSIS — Z7901 Long term (current) use of anticoagulants: Secondary | ICD-10-CM | POA: Diagnosis not present

## 2023-02-18 DIAGNOSIS — D6862 Lupus anticoagulant syndrome: Secondary | ICD-10-CM | POA: Insufficient documentation

## 2023-02-18 NOTE — Assessment & Plan Note (Addendum)
12/03/2018: Positive for acute pulmonary embolism with right heart strain 12/04/2018: DVT of right popliteal vein and right gastrocnemius vein Treatment: Xarelto since February 2020 CT chest angiogram 04/20/2019: Near total resolution of previous bilateral PE no evidence of persistent or recurrent PE   Lupus anticoagulant testing 07/14/2019: Positive   Recommendation:  lifetime Xarelto: Because of positive lupus anticoagulant.  (Even though Coumadin may be ideal for lupus anticoagulant, because of convenience we decided to remain on Xarelto)   Toxicities to Xarelto: None Shortness of breath: Improved Leg swelling: Better   RTC on an as-needed basis.

## 2023-03-04 ENCOUNTER — Ambulatory Visit: Payer: Medicare Other | Admitting: Emergency Medicine

## 2023-03-07 ENCOUNTER — Encounter: Payer: Self-pay | Admitting: Emergency Medicine

## 2023-03-14 DIAGNOSIS — M25812 Other specified joint disorders, left shoulder: Secondary | ICD-10-CM | POA: Diagnosis not present

## 2023-03-14 DIAGNOSIS — Z96641 Presence of right artificial hip joint: Secondary | ICD-10-CM | POA: Diagnosis not present

## 2023-03-14 DIAGNOSIS — Z471 Aftercare following joint replacement surgery: Secondary | ICD-10-CM | POA: Diagnosis not present

## 2023-04-30 DIAGNOSIS — Z961 Presence of intraocular lens: Secondary | ICD-10-CM | POA: Diagnosis not present

## 2023-04-30 DIAGNOSIS — H401122 Primary open-angle glaucoma, left eye, moderate stage: Secondary | ICD-10-CM | POA: Diagnosis not present

## 2023-04-30 DIAGNOSIS — H401111 Primary open-angle glaucoma, right eye, mild stage: Secondary | ICD-10-CM | POA: Diagnosis not present

## 2023-04-30 DIAGNOSIS — H0102B Squamous blepharitis left eye, upper and lower eyelids: Secondary | ICD-10-CM | POA: Diagnosis not present

## 2023-04-30 DIAGNOSIS — H0102A Squamous blepharitis right eye, upper and lower eyelids: Secondary | ICD-10-CM | POA: Diagnosis not present

## 2023-05-01 ENCOUNTER — Ambulatory Visit: Payer: Medicare Other | Admitting: Emergency Medicine

## 2023-05-01 ENCOUNTER — Encounter: Payer: Self-pay | Admitting: Emergency Medicine

## 2023-05-01 VITALS — BP 130/68 | HR 77 | Temp 98.3°F | Ht 65.0 in | Wt 220.4 lb

## 2023-05-01 DIAGNOSIS — J449 Chronic obstructive pulmonary disease, unspecified: Secondary | ICD-10-CM | POA: Diagnosis not present

## 2023-05-01 DIAGNOSIS — I2699 Other pulmonary embolism without acute cor pulmonale: Secondary | ICD-10-CM | POA: Diagnosis not present

## 2023-05-01 NOTE — Assessment & Plan Note (Signed)
She stopped the Stiolto and is tolerating well.  We will stay off of it for now.  If she develops increased dyspnea, has to begin using albuterol more frequently than certainly would revisit it, restart it since she has tolerated it in the past.   Keep albuterol available to use 2 puffs if needed for shortness of breath, chest tightness, wheezing. We will hold off on restarting Stiolto for now.  If you begin to develop increased shortness of breath, decreased functional capacity, or have to begin using your albuterol more frequently then we could consider restarting it at that time. Follow Dr. Delton Coombes in 1 year, sooner if you have any problems.

## 2023-05-01 NOTE — Progress Notes (Signed)
Subjective:    Patient ID: Kaitlin Arnold, female    DOB: 1935-12-22, 87 y.o.   MRN: [id number]   HPI  ROV 08/31/21 --this follow-up visit for 87 year old woman with multifactorial shortness of breath.  She has chronic thromboembolic disease (on lifelong anticoagulation), probable COPD and a component of restrictive disease with associated secondary PAH.  She is on Xarelto, has a lupus anticoagulant.  She may benefit from [employer].  She reports that she believes that her breathing is better than the last time I saw her. She has albuterol, uses it rarely, but does use it if she goes out shopping. No wheeze, mucous, or cough.  We had talked about possibly going to pulmonary rehab. No desat after 1 lap at her last visit, stopped due to ankle pain.   ROV 03/01/22 --87 year old woman with multifactorial dyspnea due to chronic thromboembolic disease in the setting of lupus anticoagulant (on Xarelto), probable COPD, probable restrictive disease, associated secondary PAH.  Have been managing COPD with Stiolto.  Today she reports that she has stable exertional SOB, trouble climbing stairs. She has done some yardwork recently - did OK. Wt has been stable. Uses albuterol few times a week. Does get benefit.   ROV 05/01/23 --follow-up visit for 87 year old woman, former smoker, with history of chronic thromboembolic disease in the setting of a lupus anticoagulant (on Xarelto), suspected COPD, probable component of restrictive lung disease and associated secondary PAH.  I last saw her in May 2023.  At that time a walking oximetry did not show any evidence of desaturation.  We have been managing her on Stiolto - she stopped it several months ago, did not miss it.  The plan has been for her to stay on anticoagulation lifelong. Today she reports that her breathing has been improved since last time. She will get SOB with longer walks. Uses albuterol about a few times a month - does help her. No cough or wheeze. She does not  believe she misses the stiolto.    Review of Systems As per HPI     Objective:   Physical Exam Vitals:   05/01/23 1541  BP: 130/68  Pulse: 77  Temp: 98.3 F (36.8 C)  TempSrc: Oral  SpO2: 98%  Weight: 220 lb 6.4 oz (100 kg)  Height: 5\' 5"  (1.651 m)   Gen: Pleasant, overweight woman, in no distress,  normal affect  ENT: No lesions,  mouth clear,  oropharynx clear, no postnasal drip  Neck: No JVD, no stridor  Lungs: No use of accessory muscles, distant, clear  Cardiovascular: RRR, holosystolic murmur 3/6 with an intact S2, 1+ edema with compression stockings in place  Musculoskeletal: No deformities, no cyanosis or clubbing  Neuro: alert, awake, non focal  Skin: Warm, no lesions or rash     Assessment & Plan:  COPD (chronic obstructive pulmonary disease) (HCC) She stopped the Stiolto and is tolerating well.  We will stay off of it for now.  If she develops increased dyspnea, has to begin using albuterol more frequently than certainly would revisit it, restart it since she has tolerated it in the past.   Keep albuterol available to use 2 puffs if needed for shortness of breath, chest tightness, wheezing. We will hold off on restarting Stiolto for now.  If you begin to develop increased shortness of breath, decreased functional capacity, or have to begin using your albuterol more frequently then we could consider restarting it at that time. Follow Dr. Delton Coombes in 1 year,  sooner if you have any problems.  Bilateral pulmonary embolism (HCC) Chronic thromboembolic disease in the setting of lupus anticoagulant.  Plan is for her to be on lifelong anticoagulation, tolerating Xarelto.   Levy Pupa, MD, PhD 05/01/2023, 4:13 PM  Pulmonary and Critical Care [phone] or if no answer [phone]

## 2023-05-01 NOTE — Assessment & Plan Note (Signed)
Chronic thromboembolic disease in the setting of lupus anticoagulant.  Plan is for her to be on lifelong anticoagulation, tolerating Xarelto.

## 2023-05-01 NOTE — Patient Instructions (Signed)
Please continue your Xarelto as you have been taking it Keep albuterol available to use 2 puffs if needed for shortness of breath, chest tightness, wheezing. We will hold off on restarting Stiolto for now.  If you begin to develop increased shortness of breath, decreased functional capacity, or have to begin using your albuterol more frequently then we could consider restarting it at that time. Follow Dr. Delton Coombes in 1 year, sooner if you have any problems.

## 2023-05-20 ENCOUNTER — Other Ambulatory Visit: Payer: Self-pay | Admitting: Emergency Medicine

## 2023-06-10 DIAGNOSIS — D49512 Neoplasm of unspecified behavior of left kidney: Secondary | ICD-10-CM | POA: Diagnosis not present

## 2023-06-10 DIAGNOSIS — N281 Cyst of kidney, acquired: Secondary | ICD-10-CM | POA: Diagnosis not present

## 2023-09-08 DIAGNOSIS — H401111 Primary open-angle glaucoma, right eye, mild stage: Secondary | ICD-10-CM | POA: Diagnosis not present

## 2023-09-08 DIAGNOSIS — E119 Type 2 diabetes mellitus without complications: Secondary | ICD-10-CM | POA: Diagnosis not present

## 2023-09-08 DIAGNOSIS — H0102A Squamous blepharitis right eye, upper and lower eyelids: Secondary | ICD-10-CM | POA: Diagnosis not present

## 2023-09-08 DIAGNOSIS — H35033 Hypertensive retinopathy, bilateral: Secondary | ICD-10-CM | POA: Diagnosis not present

## 2023-09-08 DIAGNOSIS — H0102B Squamous blepharitis left eye, upper and lower eyelids: Secondary | ICD-10-CM | POA: Diagnosis not present

## 2023-09-08 DIAGNOSIS — Z961 Presence of intraocular lens: Secondary | ICD-10-CM | POA: Diagnosis not present

## 2023-09-08 DIAGNOSIS — H353131 Nonexudative age-related macular degeneration, bilateral, early dry stage: Secondary | ICD-10-CM | POA: Diagnosis not present

## 2023-09-08 DIAGNOSIS — H401122 Primary open-angle glaucoma, left eye, moderate stage: Secondary | ICD-10-CM | POA: Diagnosis not present

## 2023-09-16 DIAGNOSIS — E782 Mixed hyperlipidemia: Secondary | ICD-10-CM | POA: Diagnosis not present

## 2023-09-16 DIAGNOSIS — M150 Primary generalized (osteo)arthritis: Secondary | ICD-10-CM | POA: Diagnosis not present

## 2023-09-16 DIAGNOSIS — D6869 Other thrombophilia: Secondary | ICD-10-CM | POA: Diagnosis not present

## 2023-09-16 DIAGNOSIS — Z7901 Long term (current) use of anticoagulants: Secondary | ICD-10-CM | POA: Diagnosis not present

## 2023-09-16 DIAGNOSIS — R5383 Other fatigue: Secondary | ICD-10-CM | POA: Diagnosis not present

## 2023-09-16 DIAGNOSIS — N182 Chronic kidney disease, stage 2 (mild): Secondary | ICD-10-CM | POA: Diagnosis not present

## 2023-09-16 DIAGNOSIS — I872 Venous insufficiency (chronic) (peripheral): Secondary | ICD-10-CM | POA: Diagnosis not present

## 2023-09-16 DIAGNOSIS — R7303 Prediabetes: Secondary | ICD-10-CM | POA: Diagnosis not present

## 2023-09-16 DIAGNOSIS — I10 Essential (primary) hypertension: Secondary | ICD-10-CM | POA: Diagnosis not present

## 2023-09-16 DIAGNOSIS — G609 Hereditary and idiopathic neuropathy, unspecified: Secondary | ICD-10-CM | POA: Diagnosis not present

## 2023-10-31 DIAGNOSIS — Z1231 Encounter for screening mammogram for malignant neoplasm of breast: Secondary | ICD-10-CM | POA: Diagnosis not present

## 2023-11-14 DIAGNOSIS — I700 Atherosclerosis of aorta: Secondary | ICD-10-CM | POA: Diagnosis not present

## 2023-11-14 DIAGNOSIS — Z23 Encounter for immunization: Secondary | ICD-10-CM | POA: Diagnosis not present

## 2023-11-14 DIAGNOSIS — I872 Venous insufficiency (chronic) (peripheral): Secondary | ICD-10-CM | POA: Diagnosis not present

## 2023-11-14 DIAGNOSIS — N182 Chronic kidney disease, stage 2 (mild): Secondary | ICD-10-CM | POA: Diagnosis not present

## 2023-11-14 DIAGNOSIS — D6869 Other thrombophilia: Secondary | ICD-10-CM | POA: Diagnosis not present

## 2023-11-14 DIAGNOSIS — M150 Primary generalized (osteo)arthritis: Secondary | ICD-10-CM | POA: Diagnosis not present

## 2023-11-14 DIAGNOSIS — Z7901 Long term (current) use of anticoagulants: Secondary | ICD-10-CM | POA: Diagnosis not present

## 2023-11-14 DIAGNOSIS — E782 Mixed hyperlipidemia: Secondary | ICD-10-CM | POA: Diagnosis not present

## 2023-11-14 DIAGNOSIS — I10 Essential (primary) hypertension: Secondary | ICD-10-CM | POA: Diagnosis not present

## 2023-11-14 DIAGNOSIS — Z0000 Encounter for general adult medical examination without abnormal findings: Secondary | ICD-10-CM | POA: Diagnosis not present

## 2023-11-14 DIAGNOSIS — D692 Other nonthrombocytopenic purpura: Secondary | ICD-10-CM | POA: Diagnosis not present

## 2024-01-13 ENCOUNTER — Telehealth: Payer: Self-pay | Admitting: *Deleted

## 2024-01-13 ENCOUNTER — Other Ambulatory Visit: Payer: Self-pay | Admitting: Hematology and Oncology

## 2024-01-13 NOTE — Telephone Encounter (Signed)
 RN placed call to pt regarding Xarelto refill.  Per MD last visit, pt to have PCP take over prescribing this medication.  Courtesy 30 day fill sent to pharmacy on file.  RN contacted Dr. Carolyn Stare office and spoke with the nurse who verbalized understanding of taking over responsibility of Xarelto.

## 2024-01-22 DIAGNOSIS — M25519 Pain in unspecified shoulder: Secondary | ICD-10-CM | POA: Diagnosis not present

## 2024-01-22 DIAGNOSIS — M25512 Pain in left shoulder: Secondary | ICD-10-CM | POA: Diagnosis not present

## 2024-01-22 DIAGNOSIS — D171 Benign lipomatous neoplasm of skin and subcutaneous tissue of trunk: Secondary | ICD-10-CM | POA: Diagnosis not present

## 2024-01-22 DIAGNOSIS — G8929 Other chronic pain: Secondary | ICD-10-CM | POA: Diagnosis not present

## 2024-01-27 DIAGNOSIS — H35033 Hypertensive retinopathy, bilateral: Secondary | ICD-10-CM | POA: Diagnosis not present

## 2024-01-27 DIAGNOSIS — H401122 Primary open-angle glaucoma, left eye, moderate stage: Secondary | ICD-10-CM | POA: Diagnosis not present

## 2024-01-27 DIAGNOSIS — H401111 Primary open-angle glaucoma, right eye, mild stage: Secondary | ICD-10-CM | POA: Diagnosis not present

## 2024-01-27 DIAGNOSIS — H353131 Nonexudative age-related macular degeneration, bilateral, early dry stage: Secondary | ICD-10-CM | POA: Diagnosis not present

## 2024-01-27 DIAGNOSIS — E119 Type 2 diabetes mellitus without complications: Secondary | ICD-10-CM | POA: Diagnosis not present

## 2024-02-17 DIAGNOSIS — M542 Cervicalgia: Secondary | ICD-10-CM | POA: Diagnosis not present

## 2024-02-17 DIAGNOSIS — M25511 Pain in right shoulder: Secondary | ICD-10-CM | POA: Diagnosis not present

## 2024-02-17 DIAGNOSIS — M549 Dorsalgia, unspecified: Secondary | ICD-10-CM | POA: Diagnosis not present

## 2024-02-17 DIAGNOSIS — M79642 Pain in left hand: Secondary | ICD-10-CM | POA: Diagnosis not present

## 2024-02-17 DIAGNOSIS — M2550 Pain in unspecified joint: Secondary | ICD-10-CM | POA: Diagnosis not present

## 2024-02-17 DIAGNOSIS — M1990 Unspecified osteoarthritis, unspecified site: Secondary | ICD-10-CM | POA: Diagnosis not present

## 2024-02-17 DIAGNOSIS — M7989 Other specified soft tissue disorders: Secondary | ICD-10-CM | POA: Diagnosis not present

## 2024-02-17 DIAGNOSIS — M79641 Pain in right hand: Secondary | ICD-10-CM | POA: Diagnosis not present

## 2024-02-17 DIAGNOSIS — M25512 Pain in left shoulder: Secondary | ICD-10-CM | POA: Diagnosis not present

## 2024-02-19 DIAGNOSIS — M25512 Pain in left shoulder: Secondary | ICD-10-CM | POA: Diagnosis not present

## 2024-02-19 DIAGNOSIS — G8929 Other chronic pain: Secondary | ICD-10-CM | POA: Diagnosis not present

## 2024-03-16 DIAGNOSIS — M25511 Pain in right shoulder: Secondary | ICD-10-CM | POA: Diagnosis not present

## 2024-03-16 DIAGNOSIS — M25512 Pain in left shoulder: Secondary | ICD-10-CM | POA: Diagnosis not present

## 2024-03-16 DIAGNOSIS — M1180 Other specified crystal arthropathies, unspecified site: Secondary | ICD-10-CM | POA: Diagnosis not present

## 2024-03-16 DIAGNOSIS — M7532 Calcific tendinitis of left shoulder: Secondary | ICD-10-CM | POA: Diagnosis not present

## 2024-03-16 DIAGNOSIS — M1990 Unspecified osteoarthritis, unspecified site: Secondary | ICD-10-CM | POA: Diagnosis not present

## 2024-03-16 DIAGNOSIS — M2550 Pain in unspecified joint: Secondary | ICD-10-CM | POA: Diagnosis not present

## 2024-03-26 DIAGNOSIS — M25512 Pain in left shoulder: Secondary | ICD-10-CM | POA: Diagnosis not present

## 2024-04-30 DIAGNOSIS — R7303 Prediabetes: Secondary | ICD-10-CM | POA: Diagnosis not present

## 2024-04-30 DIAGNOSIS — D6869 Other thrombophilia: Secondary | ICD-10-CM | POA: Diagnosis not present

## 2024-04-30 DIAGNOSIS — N182 Chronic kidney disease, stage 2 (mild): Secondary | ICD-10-CM | POA: Diagnosis not present

## 2024-04-30 DIAGNOSIS — I10 Essential (primary) hypertension: Secondary | ICD-10-CM | POA: Diagnosis not present

## 2024-04-30 DIAGNOSIS — E782 Mixed hyperlipidemia: Secondary | ICD-10-CM | POA: Diagnosis not present

## 2024-05-07 DIAGNOSIS — R5383 Other fatigue: Secondary | ICD-10-CM | POA: Diagnosis not present

## 2024-05-07 DIAGNOSIS — Z7901 Long term (current) use of anticoagulants: Secondary | ICD-10-CM | POA: Diagnosis not present

## 2024-05-07 DIAGNOSIS — E782 Mixed hyperlipidemia: Secondary | ICD-10-CM | POA: Diagnosis not present

## 2024-05-07 DIAGNOSIS — M150 Primary generalized (osteo)arthritis: Secondary | ICD-10-CM | POA: Diagnosis not present

## 2024-05-07 DIAGNOSIS — R7303 Prediabetes: Secondary | ICD-10-CM | POA: Diagnosis not present

## 2024-05-07 DIAGNOSIS — D692 Other nonthrombocytopenic purpura: Secondary | ICD-10-CM | POA: Diagnosis not present

## 2024-05-07 DIAGNOSIS — N182 Chronic kidney disease, stage 2 (mild): Secondary | ICD-10-CM | POA: Diagnosis not present

## 2024-05-07 DIAGNOSIS — D6869 Other thrombophilia: Secondary | ICD-10-CM | POA: Diagnosis not present

## 2024-05-07 DIAGNOSIS — I872 Venous insufficiency (chronic) (peripheral): Secondary | ICD-10-CM | POA: Diagnosis not present

## 2024-05-07 DIAGNOSIS — I10 Essential (primary) hypertension: Secondary | ICD-10-CM | POA: Diagnosis not present

## 2024-05-24 DIAGNOSIS — N281 Cyst of kidney, acquired: Secondary | ICD-10-CM | POA: Diagnosis not present

## 2024-05-24 DIAGNOSIS — D49512 Neoplasm of unspecified behavior of left kidney: Secondary | ICD-10-CM | POA: Diagnosis not present

## 2024-05-25 DIAGNOSIS — H0102B Squamous blepharitis left eye, upper and lower eyelids: Secondary | ICD-10-CM | POA: Diagnosis not present

## 2024-05-25 DIAGNOSIS — H401122 Primary open-angle glaucoma, left eye, moderate stage: Secondary | ICD-10-CM | POA: Diagnosis not present

## 2024-05-25 DIAGNOSIS — H401111 Primary open-angle glaucoma, right eye, mild stage: Secondary | ICD-10-CM | POA: Diagnosis not present

## 2024-05-25 DIAGNOSIS — H0102A Squamous blepharitis right eye, upper and lower eyelids: Secondary | ICD-10-CM | POA: Diagnosis not present

## 2024-06-03 DIAGNOSIS — M25512 Pain in left shoulder: Secondary | ICD-10-CM | POA: Diagnosis not present

## 2024-06-07 ENCOUNTER — Other Ambulatory Visit: Payer: Self-pay | Admitting: Emergency Medicine

## 2024-06-07 DIAGNOSIS — D49512 Neoplasm of unspecified behavior of left kidney: Secondary | ICD-10-CM | POA: Diagnosis not present

## 2024-06-07 DIAGNOSIS — N281 Cyst of kidney, acquired: Secondary | ICD-10-CM | POA: Diagnosis not present

## 2024-06-23 ENCOUNTER — Ambulatory Visit: Admitting: Emergency Medicine

## 2024-06-23 ENCOUNTER — Encounter: Payer: Self-pay | Admitting: Emergency Medicine

## 2024-06-23 VITALS — BP 140/86 | HR 82 | Temp 97.8°F | Ht 66.0 in | Wt 228.0 lb

## 2024-06-23 DIAGNOSIS — I2699 Other pulmonary embolism without acute cor pulmonale: Secondary | ICD-10-CM

## 2024-06-23 DIAGNOSIS — R0602 Shortness of breath: Secondary | ICD-10-CM | POA: Diagnosis not present

## 2024-06-23 DIAGNOSIS — Z87891 Personal history of nicotine dependence: Secondary | ICD-10-CM

## 2024-06-23 DIAGNOSIS — J449 Chronic obstructive pulmonary disease, unspecified: Secondary | ICD-10-CM | POA: Diagnosis not present

## 2024-06-23 NOTE — Patient Instructions (Addendum)
 We will try restarting Stiolto 2 puffs once daily to see if this helps her breathing.  If so we can continue it going forward. Keep your albuterol  available use 2 puffs up to every 4-6 hours if you need it for shortness of breath, chest tightness, wheezing. We will perform a walking oximetry today Please continue your Xarelto  as you have been taking.  Plan will be to stay on this medication indefinitely Follow with Dr. Shelah in 12 months or sooner if you have any problems.

## 2024-06-23 NOTE — Assessment & Plan Note (Signed)
 We will try restarting Stiolto 2 puffs once daily to see if this helps her breathing.  If so we can continue it going forward. Keep your albuterol  available use 2 puffs up to every 4-6 hours if you need it for shortness of breath, chest tightness, wheezing. Follow with Dr. Shelah in 12 months or sooner if you have any problems.

## 2024-06-23 NOTE — Assessment & Plan Note (Signed)
 Persistent dyspnea.  We will plan to restart chronic BD therapy as above.  She needs walking oximetry to rule out occult desaturation.  If progressive then we will consider repeating her PFT.  Discussed with her today.

## 2024-06-23 NOTE — Progress Notes (Addendum)
 Subjective:    Patient ID: Kaitlin Arnold, female    DOB: 1936/07/04, 88 y.o.   MRN: [id number]   COPD She complains of shortness of breath. Her past medical history is significant for COPD.  Shortness of Breath Her past medical history is significant for COPD.   ROV 06/23/2024 --follow-up visit for 88 year old woman with history of former tobacco use, lupus anticoagulant with chronic thromboembolic disease on Xarelto .  Suspect that she has some COPD and restrictive lung disease with associated secondary PAH.  She is not desaturated on ambulation in the past.  She has been on Stiolto before but had stopped it just over a year ago and did not seem to miss it. She is using albuterol  about once a day, usually in the morning. She does ok most mornings. She is considering shoulder surgery with Dr Kay at [facility]. Minimal cough during the day, does cough some in the night. Non-productive. No bleeding reported.   FEV1  > 77% predicted 05/22/2020   Review of Systems  Respiratory:  Positive for shortness of breath.    As per HPI     Objective:   Physical Exam Vitals:   06/23/24 1423  BP: (!) 140/86  Pulse: 82  Temp: 97.8 F (36.6 C)  TempSrc: Oral  SpO2: 98%  Weight: 228 lb (103.4 kg)  Height: 5' 6 (1.676 m)   Gen: Pleasant, overweight woman, in no distress,  normal affect  ENT: No lesions,  mouth clear,  oropharynx clear, no postnasal drip  Neck: No JVD, no stridor  Lungs: No use of accessory muscles, decreased bibasilar breath sounds.  No wheezes or crackles  Cardiovascular: RRR, holosystolic murmur 3/6 with an intact S2  Musculoskeletal: No deformities, no cyanosis or clubbing  Neuro: alert, awake, non focal  Skin: Warm, no lesions or rash     Assessment & Plan:  COPD (chronic obstructive pulmonary disease) (HCC) We will try restarting Stiolto 2 puffs once daily to see if this helps her breathing.  If so we can continue it going forward. Keep your albuterol   available use 2 puffs up to every 4-6 hours if you need it for shortness of breath, chest tightness, wheezing. Follow with Dr. Shelah in 12 months or sooner if you have any problems.   Bilateral pulmonary embolism (HCC) Please continue your Xarelto  as you have been taking.  Plan will be to stay on this medication indefinitely  SOB (shortness of breath) Persistent dyspnea.  We will plan to restart chronic BD therapy as above.  She needs walking oximetry to rule out occult desaturation.  If progressive then we will consider repeating her PFT.  Discussed with her today.    Lamar Shelah, MD, PhD 06/23/2024, 2:43 PM [facility] [phone] or if no answer [phone]    Addendum:  Patient is under evaluation by Dr. Kay with [facility] for left reverse total shoulder arthroplasty.  She is at moderate to high risk for general anesthesia due to her age and her history of pulmonary embolism and COPD.  This does not preclude her from having her shoulder surgery as long as her COPD is stable (not flaring) and the benefits outweigh associated risks.  She would need to come off her Xarelto  at least 2 days prior to the intervention, and then restart it as soon as possible/acceptable postoperatively based on orthopedics recommendations.  Lamar Shelah, MD, PhD 07/07/2024, 4:41 PM [facility] [phone] or if no answer before 7:00PM  call [phone] For any issues after 7:00PM please call eLink [phone]

## 2024-06-23 NOTE — Assessment & Plan Note (Signed)
 Please continue your Xarelto  as you have been taking.  Plan will be to stay on this medication indefinitely

## 2024-06-28 ENCOUNTER — Other Ambulatory Visit: Payer: Self-pay | Admitting: Emergency Medicine

## 2024-06-28 NOTE — Telephone Encounter (Unsigned)
 Copied from CRM #[id number]. Topic: Clinical - Medication Refill >> Jun 28, 2024  9:43 AM Russell PARAS wrote: Medication: STIOLTO RESPIMAT  2.5-2.5 MCG/ACT AERS  -Pt was advised at last visit 9/10 that she would restart Stiolto; however, new prescription was not called in  Has the patient contacted their pharmacy? Yes (Agent: If no, request that the patient contact the pharmacy for the refill. If patient does not wish to contact the pharmacy document the reason why and proceed with request.) (Agent: If yes, when and what did the pharmacy advise?)  This is the patient's preferred pharmacy:  [facility] [address] [place], [place] - [address] [place] [address] Phone: [phone] Fax: [phone]  Is this the correct pharmacy for this prescription? Yes If no, delete pharmacy and type the correct one.   Has the prescription been filled recently? No  Is the patient out of the medication? Yes  Has the patient been seen for an appointment in the last year OR does the patient have an upcoming appointment? Yes, 9/10 with [facility]  Can we respond through MyChart?  Yes Agent: Please be advised that Rx refills may take up to 3 business days. We ask that you follow-up with your pharmacy.

## 2024-06-29 MED ORDER — STIOLTO RESPIMAT 2.5-2.5 MCG/ACT IN AERS: 2.0000 | INHALATION_SPRAY | Freq: Every day | RESPIRATORY_TRACT | 1 refills | Status: DC

## 2024-07-01 DIAGNOSIS — M75102 Unspecified rotator cuff tear or rupture of left shoulder, not specified as traumatic: Secondary | ICD-10-CM | POA: Diagnosis not present

## 2024-07-01 DIAGNOSIS — M12812 Other specific arthropathies, not elsewhere classified, left shoulder: Secondary | ICD-10-CM | POA: Diagnosis not present

## 2024-07-01 DIAGNOSIS — M19012 Primary osteoarthritis, left shoulder: Secondary | ICD-10-CM | POA: Diagnosis not present

## 2024-07-05 ENCOUNTER — Telehealth: Payer: Self-pay

## 2024-07-05 NOTE — Telephone Encounter (Signed)
 Received surgical assessment from [facility] for left reverse total shoulder arthoplasty.  Patient last seen [facility] 06/23/2024.  Dr. Shelah, please advise and add assessment to note. Thanks

## 2024-07-06 DIAGNOSIS — Z01818 Encounter for other preprocedural examination: Secondary | ICD-10-CM | POA: Diagnosis not present

## 2024-07-07 NOTE — Telephone Encounter (Signed)
 I addended her OV from 06/23/24

## 2024-07-12 NOTE — Telephone Encounter (Signed)
 06/23/24 ov note was faxed to [facility].

## 2024-07-13 DIAGNOSIS — R7303 Prediabetes: Secondary | ICD-10-CM | POA: Diagnosis not present

## 2024-07-13 DIAGNOSIS — D692 Other nonthrombocytopenic purpura: Secondary | ICD-10-CM | POA: Diagnosis not present

## 2024-07-13 DIAGNOSIS — I10 Essential (primary) hypertension: Secondary | ICD-10-CM | POA: Diagnosis not present

## 2024-07-13 DIAGNOSIS — Z01818 Encounter for other preprocedural examination: Secondary | ICD-10-CM | POA: Diagnosis not present

## 2024-07-13 DIAGNOSIS — M150 Primary generalized (osteo)arthritis: Secondary | ICD-10-CM | POA: Diagnosis not present

## 2024-07-13 DIAGNOSIS — I872 Venous insufficiency (chronic) (peripheral): Secondary | ICD-10-CM | POA: Diagnosis not present

## 2024-07-13 DIAGNOSIS — Z7901 Long term (current) use of anticoagulants: Secondary | ICD-10-CM | POA: Diagnosis not present

## 2024-07-13 DIAGNOSIS — N182 Chronic kidney disease, stage 2 (mild): Secondary | ICD-10-CM | POA: Diagnosis not present

## 2024-07-13 DIAGNOSIS — Z23 Encounter for immunization: Secondary | ICD-10-CM | POA: Diagnosis not present

## 2024-07-13 DIAGNOSIS — D6869 Other thrombophilia: Secondary | ICD-10-CM | POA: Diagnosis not present

## 2024-07-13 DIAGNOSIS — E782 Mixed hyperlipidemia: Secondary | ICD-10-CM | POA: Diagnosis not present

## 2024-07-20 DIAGNOSIS — Z23 Encounter for immunization: Secondary | ICD-10-CM | POA: Diagnosis not present

## 2024-07-23 NOTE — Telephone Encounter (Signed)
 Copied from CRM [id number]. Topic: General - Other >> Jul 19, 2024 11:21 AM Essie A wrote: Reason for CRM: Patient is calling to schedule an appointment with Dr. Shelah at the request of Dr. Kay, her orthopedic doctor because she is going to have left shoulder surgery.  Please return her call at [phone].  Thanks.   ----------------------------------------------------------------------- From previous Reason for Contact - Scheduling: Patient/patient representative is calling to schedule an appointment. Refer to attachments for appointment information.  Note was faxed. Routing to [place] to advise.

## 2024-07-26 NOTE — Telephone Encounter (Signed)
 I called and spoke with the pt and notified no need for another visit with Dr. Shelah- her note from 06/23/24 has already been faxed to [facility]. Pt verbalized understanding. Nothing further needed.

## 2024-07-28 ENCOUNTER — Inpatient Hospital Stay: Attending: Hematology and Oncology | Admitting: Hematology and Oncology

## 2024-07-28 VITALS — BP 132/68 | HR 85 | Temp 97.8°F | Resp 16 | Ht 66.0 in | Wt 220.0 lb

## 2024-07-28 DIAGNOSIS — Z86718 Personal history of other venous thrombosis and embolism: Secondary | ICD-10-CM | POA: Insufficient documentation

## 2024-07-28 DIAGNOSIS — Z86711 Personal history of pulmonary embolism: Secondary | ICD-10-CM | POA: Diagnosis present

## 2024-07-28 DIAGNOSIS — D6861 Antiphospholipid syndrome: Secondary | ICD-10-CM | POA: Insufficient documentation

## 2024-07-28 DIAGNOSIS — I2699 Other pulmonary embolism without acute cor pulmonale: Secondary | ICD-10-CM

## 2024-07-28 DIAGNOSIS — Z7901 Long term (current) use of anticoagulants: Secondary | ICD-10-CM | POA: Insufficient documentation

## 2024-07-28 NOTE — Assessment & Plan Note (Signed)
 12/03/2018: Positive for acute pulmonary embolism with right heart strain 12/04/2018: DVT of right popliteal vein and right gastrocnemius vein Treatment: Xarelto  since February 2020 CT chest angiogram 04/20/2019: Near total resolution of previous bilateral PE no evidence of persistent or recurrent PE   Lupus anticoagulant testing 07/14/2019: Positive   Recommendation:  lifetime Xarelto : Because of positive lupus anticoagulant.  (Even though Coumadin may be ideal for lupus anticoagulant, because of convenience we decided to remain on Xarelto )   Toxicities to Xarelto : None Shortness of breath: Improved Leg swelling: Better She had hip replacement surgery and is recovering from that.   RTC on an as-needed basis

## 2024-07-28 NOTE — Progress Notes (Signed)
 Patient Care Team: Verdia Lombard, MD as PCP - General (Internal Medicine)  DIAGNOSIS:  Encounter Diagnosis  Name Primary?   Bilateral pulmonary embolism (HCC) Yes    CHIEF COMPLIANT: Clearance for left hip replacement surgery  HISTORY OF PRESENT ILLNESS: History of Present Illness Kaitlin Arnold is an 88 year old female who presents for pre-surgical clearance and management of anticoagulation therapy.  She is preparing for upcoming hip and shoulder surgeries. In the past, she stopped Xarelto  for three days before surgery and resumed it the day after, with heparin administered post-operatively for her hip surgery. She had blood work done last month. There is no mention of any recent blood transfusions, with the last one occurring last year.     ALLERGIES:  has no known allergies.  MEDICATIONS:  Current Outpatient Medications  Medication Sig Dispense Refill   acetaminophen  (TYLENOL ) 325 MG tablet Take 650 mg by mouth every 6 (six) hours as needed for moderate pain.      amLODipine  (NORVASC ) 5 MG tablet Take 5 mg by mouth daily.     atorvastatin  (LIPITOR) 20 MG tablet Take 20 mg by mouth every evening.     Cholecalciferol  (VITAMIN D ) 50 MCG (2000 UT) tablet Take 2,000 Units by mouth daily.     fluticasone  (FLONASE  ALLERGY RELIEF) 50 MCG/ACT nasal spray Place 1 spray into both nostrils daily as needed for allergies.     hydrochlorothiazide  (HYDRODIURIL ) 25 MG tablet Take 25 mg by mouth daily.     labetalol  (NORMODYNE ) 200 MG tablet Take 1 tablet by mouth twice daily 180 tablet 1   losartan  (COZAAR ) 100 MG tablet Take 100 mg by mouth daily.     SIMBRINZA 1-0.2 % SUSP Place 1 drop into both eyes in the morning and at bedtime.     Tiotropium Bromide-Olodaterol (STIOLTO RESPIMAT ) 2.5-2.5 MCG/ACT AERS Inhale 2 puffs into the lungs daily at 2 PM. 4 g 1   triamcinolone  cream (KENALOG) 0.1 % Apply 1 application topically 2 (two) times daily as needed (dermatitis).      XARELTO  20 MG  TABS tablet TAKE 1 TABLET BY MOUTH ONCE DAILY WITH SUPPER 30 tablet 0   amLODipine  (NORVASC ) 10 MG tablet TAKE 1 TABLET BY MOUTH ONCE DAILY IN THE MORNING (Patient not taking: Reported on 07/28/2024) 90 tablet 1   methocarbamol  (ROBAXIN ) 500 MG tablet Take 1 tablet (500 mg total) by mouth every 6 (six) hours as needed for muscle spasms. (Patient not taking: Reported on 07/28/2024) 40 tablet 2   Polyethyl Glycol-Propyl Glycol 0.4-0.3 % SOLN Apply 1 drop to eye 3 (three) times daily as needed (dry eyes).  (Patient not taking: Reported on 07/28/2024)     No current facility-administered medications for this visit.    PHYSICAL EXAMINATION: ECOG PERFORMANCE STATUS: 1 - Symptomatic but completely ambulatory  Vitals:   07/28/24 1053  BP: 132/68  Pulse: 85  Resp: 16  Temp: 97.8 F (36.6 C)  SpO2: 98%   Filed Weights   07/28/24 1053  Weight: 220 lb (99.8 kg)    Physical Exam   (exam performed in the presence of a chaperone)  LABORATORY DATA:  I have reviewed the data as listed    Latest Ref Rng & Units 01/22/2023    3:45 AM 01/09/2023   10:30 AM 11/23/2021    1:02 PM  CMP  Glucose 70 - 99 mg/dL 844  878  834   BUN 8 - 23 mg/dL 28  17  20  Creatinine 0.44 - 1.00 mg/dL 8.95  9.06  9.08   Sodium 135 - 145 mmol/L 133  138  135   Potassium 3.5 - 5.1 mmol/L 3.7  3.7  3.5   Chloride 98 - 111 mmol/L 103  103  104   CO2 22 - 32 mmol/L 22  27  24    Calcium  8.9 - 10.3 mg/dL 8.7  9.3  9.4   Total Protein 6.5 - 8.1 g/dL   7.3   Total Bilirubin 0.3 - 1.2 mg/dL   0.5   Alkaline Phos 38 - 126 U/L   95   AST 15 - 41 U/L   17   ALT 0 - 44 U/L   18     Lab Results  Component Value Date   WBC 10.5 01/22/2023   HGB 10.2 (L) 01/22/2023   HCT 32.5 (L) 01/22/2023   MCV 86.2 01/22/2023   PLT 208 01/22/2023   NEUTROABS 6.3 11/23/2021    ASSESSMENT & PLAN:  Bilateral pulmonary embolism (HCC) 12/03/2018: Positive for acute pulmonary embolism with right heart strain 12/04/2018: DVT of right  popliteal vein and right gastrocnemius vein Treatment: Xarelto  since February 2020 CT chest angiogram 04/20/2019: Near total resolution of previous bilateral PE no evidence of persistent or recurrent PE   Lupus anticoagulant testing 07/14/2019: Positive   Recommendation:  lifetime Xarelto : Because of positive lupus anticoagulant.  (Even though Coumadin may be ideal for lupus anticoagulant, because of convenience we decided to remain on Xarelto )   Toxicities to Xarelto : None Clearance for left hip replacement surgery: I discussed with the patient that she would stop Xarelto  48 hours prior to surgery and would resume it the day after the surgery. Assessment & Plan Venous thromboembolism in the setting of antiphospholipid syndrome Venous thromboembolism with bilateral pulmonary embolism and right popliteal and gastrocnemius vein thrombosis due to antiphospholipid syndrome. On Rivaroxaban , requires perioperative management for hip and shoulder surgeries. - Stop Rivaroxaban  48 hours prior to surgery. - Resume Rivaroxaban  the day after surgery.      No orders of the defined types were placed in this encounter.  The patient has a good understanding of the overall plan. she agrees with it. she will call with any problems that may develop before the next visit here.  I personally spent a total of 30 minutes in the care of the patient today including preparing to see the patient, getting/reviewing separately obtained history, performing a medically appropriate exam/evaluation, counseling and educating, placing orders, referring and communicating with other health care professionals, documenting clinical information in the EHR, independently interpreting results, communicating results, and coordinating care.   Viinay K Benson Porcaro, MD 07/28/24

## 2024-08-23 NOTE — Progress Notes (Addendum)
 " Cardiology Office Note:    Date:  08/27/2024   ID:  Kaitlin Arnold, DOB [birth year], MRN [id number]  PCP:  Kaitlin Lombard, MD  Cardiologist:  None  Electrophysiologist:  None   Referring MD: Kaitlin Lombard, MD   Chief Complaint  Patient presents with   Pre-op Exam    History of Present Illness:    Kaitlin Arnold is a 88 y.o. female with a hx of PE/DVT, hypertension, hyperlipidemia, T2DM, tobacco use, COPD who is referred by Dr. Verdia for preop evaluation.  Previously followed with Dr. Ladona.  Lexiscan  Myoview  03/2019 showed normal perfusion.  Echocardiogram 04/2020 showed normal biventricular function, G1DD, mildly elevated pulmonary pressures, mild left atrial enlargement, no significant valvular disease.  She reports planning shoulder surgery.  She denies any chest pain but does report she gets short of breath.  States that walking up a flight of stairs will get short of breath.  Denies any lightheadedness or syncope.  Does report some swelling in ankles.  Denies any palpitations.  She is on Xarelto , has had some recent rectal bleeding, follows with GI.  She quit smoking in 2020.  No family history of heart disease.   Past Medical History:  Diagnosis Date   Aortic atherosclerosis 12/04/2018   Arthritis    oa and pain left knee;  previous right total knee replacement  1998   CAD (coronary artery disease) 12/04/2018   Chest pain 12/18/2018   COPD (chronic obstructive pulmonary disease) (HCC)    GERD (gastroesophageal reflux disease)    seldom   H/O cardiovascular stress test 05/27/2012   STRESS TEST WAS DONE for cardiac clearance for left total knee replacement--given clearance by dr. ladona.   EF 71%, NO ISCHEMIA   Hyperlipidemia    Hypertension    Shortness of breath    with exertion   SOB (shortness of breath) 12/18/2018    Past Surgical History:  Procedure Laterality Date   EYE SURGERY Bilateral    cataract surgery w/ IOL   HAND SURGERY Bilateral     carpal tunnel release bilateral   HERNIA REPAIR     40 yrs ago    umbilical hernia repair   JOINT REPLACEMENT Right 1998   right total knee replacement   TOTAL HIP ARTHROPLASTY Right 01/21/2023   Procedure: TOTAL HIP ARTHROPLASTY ANTERIOR APPROACH;  Surgeon: Kaitlin Cough, MD;  Location: [facility];  Service: Orthopedics;  Laterality: Right;   TOTAL KNEE ARTHROPLASTY  06/09/2012   Procedure: TOTAL KNEE ARTHROPLASTY;  Surgeon: Arnold Kaitlin Ernie, MD;  Location: [facility];  Service: Orthopedics;  Laterality: Left;   TUBAL LIGATION      Current Medications: Current Meds  Medication Sig   acetaminophen  (TYLENOL ) 325 MG tablet Take 650 mg by mouth every 6 (six) hours as needed for moderate pain.    amLODipine  (NORVASC ) 10 MG tablet TAKE 1 TABLET BY MOUTH ONCE DAILY IN THE MORNING   amLODipine  (NORVASC ) 5 MG tablet Take 5 mg by mouth daily.   atorvastatin  (LIPITOR) 20 MG tablet Take 20 mg by mouth every evening.   Cholecalciferol  (VITAMIN D ) 50 MCG (2000 UT) tablet Take 2,000 Units by mouth daily.   fluticasone  (FLONASE  ALLERGY RELIEF) 50 MCG/ACT nasal spray Place 1 spray into both nostrils daily as needed for allergies.   hydrochlorothiazide  (HYDRODIURIL ) 25 MG tablet Take 25 mg by mouth daily.   labetalol  (NORMODYNE ) 200 MG tablet Take 1 tablet by mouth twice daily   losartan  (COZAAR ) 100 MG tablet Take 100  mg by mouth daily.   Polyethyl Glycol-Propyl Glycol 0.4-0.3 % SOLN Apply 1 drop to eye 3 (three) times daily as needed (dry eyes).    SIMBRINZA 1-0.2 % SUSP Place 1 drop into both eyes in the morning and at bedtime.   triamcinolone  cream (KENALOG) 0.1 % Apply 1 application topically 2 (two) times daily as needed (dermatitis).    XARELTO  20 MG TABS tablet TAKE 1 TABLET BY MOUTH ONCE DAILY WITH SUPPER     Allergies:   Patient has no known allergies.   Social History   Socioeconomic History   Marital status: Widowed    Spouse name: Not on file   Number of children: 3   Years of education: Not on  file   Highest education level: Not on file  Occupational History   Not on file  Tobacco Use   Smoking status: Former    Current packs/day: 0.00    Average packs/day: 0.5 packs/day for 40.0 years (20.0 ttl pk-yrs)    Types: Cigarettes    Start date: 12/04/1978    Quit date: 12/04/2018    Years since quitting: 5.7   Smokeless tobacco: Never   Tobacco comments:    not everyday smoker--if smoking 4 to 5 cigs a day  Vaping Use   Vaping status: Never Used  Substance and Sexual Activity   Alcohol use: Yes    Comment: very seldom   Drug use: No   Sexual activity: Not Currently  Other Topics Concern   Not on file  Social History Narrative   Not on file   Social Drivers of Health   Financial Resource Strain: Not on file  Food Insecurity: No Food Insecurity (01/21/2023)   Hunger Vital Sign    Worried About Running Out of Food in the Last Year: Never true    Ran Out of Food in the Last Year: Never true  Transportation Needs: No Transportation Needs (01/21/2023)   PRAPARE - Administrator, Civil Service (Medical): No    Lack of Transportation (Non-Medical): No  Physical Activity: Not on file  Stress: Not on file  Social Connections: Not on file     Family History: The patient's family history includes Colon cancer in her sister and sister; Diabetes in her father; Heart attack in her sister.  ROS:   Please see the history of present illness.     All other systems reviewed and are negative.  EKGs/Labs/Other Studies Reviewed:    The following studies were reviewed today:   EKG:   08/27/2024: Normal sinus rhythm, rate 68, right bundle branch block, left anterior fascicular block  Recent Labs: No results found for requested labs within last 365 days.  Recent Lipid Panel No results found for: CHOL, TRIG, HDL, CHOLHDL, VLDL, LDLCALC, LDLDIRECT  Physical Exam:    VS:  BP (!) 150/66 (BP Location: Left Arm, Patient Position: Sitting, Cuff Size: Large)    Pulse 68   Ht 5' 5 (1.651 m)   Wt 233 lb (105.7 kg)   SpO2 96%   BMI 38.77 kg/m     Wt Readings from Last 3 Encounters:  08/27/24 233 lb (105.7 kg)  07/28/24 220 lb (99.8 kg)  06/23/24 228 lb (103.4 kg)     GEN:  Well nourished, well developed in no acute distress HEENT: Normal NECK: No JVD; No carotid bruits LYMPHATICS: No lymphadenopathy CARDIAC: RRR, no murmurs, rubs, gallops RESPIRATORY:  Clear to auscultation without rales, wheezing or rhonchi  ABDOMEN: Soft, non-tender,  non-distended MUSCULOSKELETAL: Trace edema; No deformity  SKIN: Warm and dry NEUROLOGIC:  Alert and oriented x 3 PSYCHIATRIC:  Normal affect   ASSESSMENT:    1. Pre-op evaluation   2. Pulmonary hypertension, unspecified (HCC)   3. Essential hypertension   4. Hyperlipidemia, unspecified hyperlipidemia type   5. SOB (shortness of breath)    PLAN:    Preop evaluation: Prior to shoulder surgery.  Denies any chest pain but reports dyspnea with walking up flight of stairs.  Could be due to COPD but recommend Lexiscan  Myoview  to rule out ischemia.   Pulmonary hypertension: Mildly elevated pulmonary pressures on echocardiogram in 2021, will update echocardiogram  PE/DVT: Unprovoked PE 11/2018.  Follows with hematology.  Positive lupus anticoagulant.  On Xarelto   Hypertension: On amlodipine  10 mg daily and HCTZ 25 mg daily and losartan  100 mg daily  Hyperlipidemia: On atorvastatin  20 mg daily.  LDL 86 on 04/2024  T2DM: A1c 6.6% 06/2024  Renal mass: follows with urology, monitoring   RTC in 6 months  Informed Consent   Shared Decision Making/Informed Consent The risks [chest pain, shortness of breath, cardiac arrhythmias, dizziness, blood pressure fluctuations, myocardial infarction, stroke/transient ischemic attack, nausea, vomiting, allergic reaction, radiation exposure, metallic taste sensation and life-threatening complications (estimated to be 1 in 10,000)], benefits (risk stratification, diagnosing  coronary artery disease, treatment guidance) and alternatives of a nuclear stress test were discussed in detail with Kaitlin Arnold and she agrees to proceed.       Medication Adjustments/Labs and Tests Ordered: Current medicines are reviewed at length with the patient today.  Concerns regarding medicines are outlined above.  Orders Placed This Encounter  Procedures   MYOCARDIAL PERFUSION IMAGING   EKG 12-Lead   ECHOCARDIOGRAM COMPLETE   No orders of the defined types were placed in this encounter.   Patient Instructions  Medication Instructions:  Your physician recommends that you continue on your current medications as directed. Please refer to the Current Medication list given to you today.  *If you need a refill on your cardiac medications before your next appointment, please call your pharmacy*  Lab Work: None  If you have labs (blood work) drawn today and your tests are completely normal, you will receive your results only by: MyChart Message (if you have MyChart) OR A paper copy in the mail If you have any lab test that is abnormal or we need to change your treatment, we will call you to review the results.  Testing/Procedures: Echocardiogram  Your physician has requested that you have an echocardiogram. Echocardiography is a painless test that uses sound waves to create images of your heart. It provides your doctor with information about the size and shape of your heart and how well your hearts chambers and valves are working. This procedure takes approximately one hour. There are no restrictions for this procedure. Please do NOT wear cologne, perfume, aftershave, or lotions (deodorant is allowed). Please arrive 15 minutes prior to your appointment time.  Please note: We ask at that you not bring children with you during ultrasound (echo/ vascular) testing. Due to room size and safety concerns, children are not allowed in the ultrasound rooms during exams. Our front office staff  cannot provide observation of children in our lobby area while testing is being conducted. An adult accompanying a patient to their appointment will only be allowed in the ultrasound room at the discretion of the ultrasound technician under special circumstances. We apologize for any inconvenience.   Lexi Scan Your physician  has requested that you have a lexiscan  myoview . For further information please visit [email or web address]  [facility] Cardiovascular Imaging at [facility] [address] [place], [place] [address] Phone: [phone]  August 27, 2024    Kaitlin Arnold DOB: [birth year] MRN: [id number] [address] [place] [address]   Dear Kaitlin Arnold,  You are scheduled for a Myocardial Perfusion Imaging Study on:   at .  Please arrive 15 minutes prior to your appointment time for registration and insurance purposes.  The test will take approximately 3 to 4 hours to complete; you may bring reading material.  If someone comes with you to your appointment, they will need to remain in the main lobby due to limited space in the testing area. **If you are pregnant or breastfeeding, please notify the nuclear lab prior to your appointment**  How to prepare for your Myocardial Perfusion Test: Do not eat or drink 3 hours prior to your test, except you may have water . Do not consume products containing caffeine (regular or decaffeinated) 12 hours prior to your test. (ex: coffee, chocolate, sodas, tea). Do bring a list of your current medications with you.  If not listed below, you may take your medications as normal. Do not take labetolol (Trandate ) for 24 hours prior to the test.  Bring the medication to your appointment as you may be required to take it once the test is complete. Do wear comfortable clothes (no dresses or overalls) and walking shoes, tennis shoes preferred (No heels or open toe shoes are allowed). Do NOT wear cologne, perfume, aftershave, or lotions (deodorant is  allowed). If these instructions are not followed, your test will have to be rescheduled.  Please report to [address] ([facility]), 2nd Floor, for your test.  If you have questions or concerns about your appointment, you can call the Nuclear Lab at [phone].  If you cannot keep your appointment, please provide 24 hours notification to the Nuclear Lab, to avoid a possible $50 charge to your [email or web address]. Please follow instruction sheet, as given.   Follow-Up: At [facility], you and your health needs are our priority.  As part of our continuing mission to provide you with exceptional heart care, our providers are all part of one team.  This team includes your primary Cardiologist (physician) and Advanced Practice Providers or APPs (Physician Assistants and Nurse Practitioners) who all work together to provide you with the care you need, when you need it.  Your next appointment:   6 month(s)  Provider:   Dr. Lonni Nanas   We recommend signing up for the patient portal called MyChart.  Sign up information is provided on this After Visit Summary.  MyChart is used to connect with patients for Virtual Visits (Telemedicine).  Patients are able to view lab/test results, encounter notes, upcoming appointments, etc.  Non-urgent messages can be sent to your provider as well.   To learn more about what you can do with MyChart, go to [email or web address].   Other Instructions None            Signed, Lonni LITTIE Nanas, MD  08/27/2024 5:16 PM    [facility] "

## 2024-08-27 ENCOUNTER — Ambulatory Visit: Attending: Cardiology | Admitting: Cardiology

## 2024-08-27 VITALS — BP 150/66 | HR 68 | Ht 65.0 in | Wt 233.0 lb

## 2024-08-27 DIAGNOSIS — E785 Hyperlipidemia, unspecified: Secondary | ICD-10-CM

## 2024-08-27 DIAGNOSIS — Z01818 Encounter for other preprocedural examination: Secondary | ICD-10-CM

## 2024-08-27 DIAGNOSIS — I10 Essential (primary) hypertension: Secondary | ICD-10-CM

## 2024-08-27 DIAGNOSIS — R0602 Shortness of breath: Secondary | ICD-10-CM

## 2024-08-27 DIAGNOSIS — I272 Pulmonary hypertension, unspecified: Secondary | ICD-10-CM

## 2024-08-27 NOTE — Patient Instructions (Signed)
 Medication Instructions:  Your physician recommends that you continue on your current medications as directed. Please refer to the Current Medication list given to you today.  *If you need a refill on your cardiac medications before your next appointment, please call your pharmacy*  Lab Work: None  If you have labs (blood work) drawn today and your tests are completely normal, you will receive your results only by: MyChart Message (if you have MyChart) OR A paper copy in the mail If you have any lab test that is abnormal or we need to change your treatment, we will call you to review the results.  Testing/Procedures: Echocardiogram  Your physician has requested that you have an echocardiogram. Echocardiography is a painless test that uses sound waves to create images of your heart. It provides your doctor with information about the size and shape of your heart and how well your heart's chambers and valves are working. This procedure takes approximately one hour. There are no restrictions for this procedure. Please do NOT wear cologne, perfume, aftershave, or lotions (deodorant is allowed). Please arrive 15 minutes prior to your appointment time.  Please note: We ask at that you not bring children with you during ultrasound (echo/ vascular) testing. Due to room size and safety concerns, children are not allowed in the ultrasound rooms during exams. Our front office staff cannot provide observation of children in our lobby area while testing is being conducted. An adult accompanying a patient to their appointment will only be allowed in the ultrasound room at the discretion of the ultrasound technician under special circumstances. We apologize for any inconvenience.   Lexi Scan Your physician has requested that you have a lexiscan  myoview . For further information please visit [email or web address]  [facility] Cardiovascular Imaging at [facility] [address] [place], [place] [address] Phone:  [phone]  August 27, 2024    Kaitlin Arnold DOB: 17-Dec-1935 MRN: [id number] [address] [place] [address]   Dear Kaitlin Arnold,  You are scheduled for a Myocardial Perfusion Imaging Study on:   at .  Please arrive 15 minutes prior to your appointment time for registration and insurance purposes.  The test will take approximately 3 to 4 hours to complete; you may bring reading material.  If someone comes with you to your appointment, they will need to remain in the main lobby due to limited space in the testing area. **If you are pregnant or breastfeeding, please notify the nuclear lab prior to your appointment**  How to prepare for your Myocardial Perfusion Test: Do not eat or drink 3 hours prior to your test, except you may have water . Do not consume products containing caffeine (regular or decaffeinated) 12 hours prior to your test. (ex: coffee, chocolate, sodas, tea). Do bring a list of your current medications with you.  If not listed below, you may take your medications as normal. Do not take labetolol (Trandate ) for 24 hours prior to the test.  Bring the medication to your appointment as you may be required to take it once the test is complete. Do wear comfortable clothes (no dresses or overalls) and walking shoes, tennis shoes preferred (No heels or open toe shoes are allowed). Do NOT wear cologne, perfume, aftershave, or lotions (deodorant is allowed). If these instructions are not followed, your test will have to be rescheduled.  Please report to [address] ([facility]), 2nd Floor, for your test.  If you have questions or  concerns about your appointment, you can call the Nuclear Lab at [phone].  If you cannot keep your appointment, please provide 24 hours notification to the Nuclear Lab, to avoid a possible $50 charge to your [email or web address]. Please follow instruction sheet, as given.   Follow-Up: At [facility], you and your health needs are our priority.  As part of our continuing mission to provide you with exceptional heart care, our providers are all part of one team.  This team includes your primary Cardiologist (physician) and Advanced Practice Providers or APPs (Physician Assistants and Nurse Practitioners) who all work together to provide you with the care you need, when you need it.  Your next appointment:   6 month(s)  Provider:   Dr. Lonni Nanas   We recommend signing up for the patient portal called MyChart.  Sign up information is provided on this After Visit Summary.  MyChart is used to connect with patients for Virtual Visits (Telemedicine).  Patients are able to view lab/test results, encounter notes, upcoming appointments, etc.  Non-urgent messages can be sent to your provider as well.   To learn more about what you can do with MyChart, go to [email or web address].   Other Instructions None

## 2024-09-06 ENCOUNTER — Other Ambulatory Visit: Payer: Self-pay | Admitting: Emergency Medicine

## 2024-09-28 ENCOUNTER — Telehealth (HOSPITAL_COMMUNITY): Payer: Self-pay

## 2024-09-28 NOTE — Telephone Encounter (Signed)
 Detailed instructions left on the patient's answering machine. Kaitlin Arnold CCT

## 2024-10-01 ENCOUNTER — Other Ambulatory Visit: Payer: Self-pay | Admitting: Cardiology

## 2024-10-01 DIAGNOSIS — Z01818 Encounter for other preprocedural examination: Secondary | ICD-10-CM

## 2024-10-01 DIAGNOSIS — R0602 Shortness of breath: Secondary | ICD-10-CM

## 2024-10-01 NOTE — Addendum Note (Signed)
 Addended by: KATE BRUCKNER on: 10/01/2024 02:05 PM   Modules accepted: Orders

## 2024-10-05 ENCOUNTER — Ambulatory Visit (HOSPITAL_BASED_OUTPATIENT_CLINIC_OR_DEPARTMENT_OTHER)
Admission: RE | Admit: 2024-10-05 | Discharge: 2024-10-05 | Disposition: A | Discharge status: Home / Self Care | Source: Ambulatory Visit | Attending: Internal Medicine | Admitting: Internal Medicine

## 2024-10-05 ENCOUNTER — Ambulatory Visit (HOSPITAL_COMMUNITY)
Admission: RE | Admit: 2024-10-05 | Discharge: 2024-10-05 | Disposition: A | Discharge status: Home / Self Care | Source: Ambulatory Visit | Attending: Cardiology | Admitting: Cardiology

## 2024-10-05 ENCOUNTER — Ambulatory Visit (HOSPITAL_COMMUNITY)

## 2024-10-05 DIAGNOSIS — Z01810 Encounter for preprocedural cardiovascular examination: Secondary | ICD-10-CM | POA: Diagnosis not present

## 2024-10-05 DIAGNOSIS — R0602 Shortness of breath: Secondary | ICD-10-CM

## 2024-10-05 DIAGNOSIS — Z01818 Encounter for other preprocedural examination: Secondary | ICD-10-CM

## 2024-10-05 LAB — ECHOCARDIOGRAM COMPLETE
Area-P 1/2: 7.59 cm2
S' Lateral: 2.85 cm

## 2024-10-05 MED ORDER — REGADENOSON 0.4 MG/5ML IV SOLN
0.4000 mg | Freq: Once | INTRAVENOUS | Status: AC
  Administered 2024-10-05: 0.4 mg via INTRAVENOUS

## 2024-10-05 MED ORDER — TECHNETIUM TC 99M TETROFOSMIN IV KIT
30.9000 | PACK | Freq: Once | INTRAVENOUS | Status: AC | PRN
  Administered 2024-10-05: 30.9 via INTRAVENOUS

## 2024-10-05 MED ORDER — TECHNETIUM TC 99M TETROFOSMIN IV KIT
10.5000 | PACK | Freq: Once | INTRAVENOUS | Status: AC | PRN
  Administered 2024-10-05: 10.5 via INTRAVENOUS

## 2024-10-05 MED ORDER — REGADENOSON 0.4 MG/5ML IV SOLN
INTRAVENOUS | Status: AC
  Filled 2024-10-05: qty 5

## 2024-10-06 LAB — MYOCARDIAL PERFUSION IMAGING
LV dias vol: 79 mL (ref 46–106)
LV sys vol: 15 mL
Nuc Stress EF: 81 %
Peak HR: 83 {beats}/min
Rest HR: 71 {beats}/min
Rest Nuclear Isotope Dose: 10.5 mCi
SDS: 1
SRS: 8
SSS: 6
ST Depression (mm): 0 mm
Stress Nuclear Isotope Dose: 30.9 mCi
TID: 1.02

## 2024-10-08 ENCOUNTER — Ambulatory Visit: Payer: Self-pay | Admitting: Cardiology

## 2024-10-08 DIAGNOSIS — N289 Disorder of kidney and ureter, unspecified: Secondary | ICD-10-CM

## 2024-10-08 DIAGNOSIS — R911 Solitary pulmonary nodule: Secondary | ICD-10-CM

## 2024-10-08 DIAGNOSIS — E042 Nontoxic multinodular goiter: Secondary | ICD-10-CM

## 2024-10-08 DIAGNOSIS — E041 Nontoxic single thyroid nodule: Secondary | ICD-10-CM

## 2024-10-11 NOTE — Telephone Encounter (Signed)
 Pt calling back with more questions about testing. Please advise.

## 2024-10-12 ENCOUNTER — Telehealth: Payer: Self-pay | Admitting: Cardiology

## 2024-10-12 NOTE — Telephone Encounter (Signed)
 Spoke with patient.  MRI abdomen had been ordered to follow-up renal mass seen on CT for stress test.  She is following with urology for this and having this monitored, we can cancel MRI abdomen.  Would proceed with thyroid  ultrasound for thyroid  nodule and CT chest for lung nodule.

## 2024-10-13 ENCOUNTER — Telehealth (HOSPITAL_BASED_OUTPATIENT_CLINIC_OR_DEPARTMENT_OTHER): Payer: Self-pay

## 2024-10-13 NOTE — Telephone Encounter (Signed)
 Spoke to Patient and  she spoke with Dr. Kate and MRI of abdomen  per Dr. Kate and patient needs to be cancelled. Per patient she already had procedure done by her urologist.

## 2024-10-13 NOTE — Telephone Encounter (Signed)
"  ° °  Pre-operative Risk Assessment    Patient Name: Kaitlin Arnold  DOB: [birth year] MRN: [id number]   Date of last office visit: 08/27/2024 with Dr. Kate Date of next office visit: None  Request for Surgical Clearance    Procedure:  Left reverse total shoulder arthroscopy  Date of Surgery:  Clearance TBD                                 Surgeon:  Dr. Elspeth Her Surgeon's Group or Practice Name:  [facility] Phone number:  [phone]- Duwaine Moats Fax number:  [phone]   Type of Clearance Requested:   - Medical  - Pharmacy:  Hold Rivaroxaban  (Xarelto ) -does not specify   Type of Anesthesia:  Choice   Additional requests/questions:  None  SignedPatrcia Iverson Arnold   10/13/2024, 11:58 AM   "

## 2024-10-19 ENCOUNTER — Ambulatory Visit (HOSPITAL_COMMUNITY)

## 2024-10-19 ENCOUNTER — Ambulatory Visit (HOSPITAL_COMMUNITY)
Admission: RE | Admit: 2024-10-19 | Discharge: 2024-10-19 | Disposition: A | Discharge status: Home / Self Care | Source: Ambulatory Visit | Attending: Cardiology | Admitting: Cardiology

## 2024-10-19 DIAGNOSIS — E041 Nontoxic single thyroid nodule: Secondary | ICD-10-CM | POA: Insufficient documentation

## 2024-10-19 NOTE — Telephone Encounter (Signed)
"  ° °  Patient Name: Kaitlin Arnold  DOB: [birth year] MRN: [id number]  Primary Cardiologist: None  Chart reviewed as part of pre-operative protocol coverage. Patient was seen in clinic by Dr. Kate on 08/27/2024 for preoperative cardiac evaluation.  She underwent stress test on 10/05/2024 that was normal, per Dr. Kate no further workup recommended prior to her surgery. Given past medical history and time since last visit, based on ACC/AHA guidelines, Kaitlin Arnold is at acceptable risk for the planned procedure without further cardiovascular testing.   Patient Xarelto  is not prescribed by cardiology, recommendations regarding holding of Xarelto  will need to come from Dr. Odean (oncology).   I will route this recommendation to the requesting party via Epic fax function and remove from pre-op pool.  Please call with questions.  Kaitlin Peplinski D Farron Watrous, NP 10/19/2024, 4:50 PM  "

## 2024-10-19 NOTE — Telephone Encounter (Signed)
 Xarelto  is not prescribed by cardiology, rather Dr. Odean (oncology) who has provider her hold instructions in the past (October 2025)

## 2024-10-21 ENCOUNTER — Ambulatory Visit: Payer: Self-pay | Admitting: Cardiology

## 2024-10-21 ENCOUNTER — Ambulatory Visit (HOSPITAL_COMMUNITY)
Admission: RE | Admit: 2024-10-21 | Discharge: 2024-10-21 | Disposition: A | Discharge status: Home / Self Care | Source: Ambulatory Visit | Attending: Cardiology | Admitting: Cardiology

## 2024-10-21 DIAGNOSIS — R911 Solitary pulmonary nodule: Secondary | ICD-10-CM | POA: Insufficient documentation

## 2024-10-21 DIAGNOSIS — E041 Nontoxic single thyroid nodule: Secondary | ICD-10-CM

## 2024-10-21 DIAGNOSIS — R918 Other nonspecific abnormal finding of lung field: Secondary | ICD-10-CM

## 2024-10-22 ENCOUNTER — Other Ambulatory Visit: Payer: Self-pay | Admitting: *Deleted

## 2024-10-22 ENCOUNTER — Telehealth: Payer: Self-pay | Admitting: Emergency Medicine

## 2024-10-22 NOTE — Telephone Encounter (Signed)
 Fax received from Dr. Elspeth Her with [facility] to perform a left total shoulder arthroplasty under choice anesthesia on patient.  Patient needs surgery clearance. Surgery is TBD. Patient was seen on 06/23/24. Office protocol is a risk assessment can be sent to surgeon if patient has been seen in 60 days or less.   Pt needs appt for risk assessment. I called her to scheduled appt and there was no answer- LMTCB

## 2024-10-22 NOTE — Telephone Encounter (Signed)
 Called and made patient aware of Dr. Kate recommendations and us  thyroid  faxed to pcp. Pt verbalized an understanding

## 2024-10-25 ENCOUNTER — Telehealth (HOSPITAL_BASED_OUTPATIENT_CLINIC_OR_DEPARTMENT_OTHER): Payer: Self-pay

## 2024-10-25 NOTE — Telephone Encounter (Signed)
 Patient returned call and I got her scheduled for January 30th

## 2024-10-25 NOTE — Telephone Encounter (Signed)
"  ° °  Pre-operative Risk Assessment    Patient Name: Kaitlin Arnold  DOB: [birth year] MRN: [id number]   Date of last office visit: 08/27/24 with Dr. Kate Date of next office visit: NA  Request for Surgical Clearance    Procedure:  colonoscopy  Date of Surgery:  Clearance 11/03/24                                 Surgeon:  Dr. Kristie Socks Group or Practice Name:  [facility] Phone number:  [phone] Fax number:  [phone]   Type of Clearance Requested:   - Medical  - Pharmacy:  Hold Rivaroxaban  (Xarelto ) not indicated    Type of Anesthesia:  propofol    Additional requests/questions:    Kaitlin Arnold   10/25/2024, 11:15 AM   "

## 2024-10-25 NOTE — Telephone Encounter (Signed)
"  ° °  Patient Name: Kaitlin Arnold  DOB: 30-Dec-1935 MRN: [id number]  Primary Cardiologist: None  Chart reviewed as part of pre-operative protocol coverage. Given past medical history and time since last visit, based on ACC/AHA guidelines, MARILYN NIHISER is at acceptable risk for the planned procedure without further cardiovascular testing.   If she develops new symptoms prior to her procedure, she should contact our office to arrange for a follow-up visit.  There was no request to hold cardiac medications.  I will route this recommendation to the requesting party via Epic fax function and remove from pre-op pool.  Please call with questions.  Saddie GORMAN Cleaves, NP 10/25/2024, 11:19 AM  "

## 2024-10-26 ENCOUNTER — Telehealth: Payer: Self-pay

## 2024-10-26 NOTE — Telephone Encounter (Signed)
 RN called and advised pt per MD orders to stop Xarelto  3 days prior to colonoscopy and resume 1 day post colonoscopy. Pt verbalized understanding.

## 2024-10-26 NOTE — Progress Notes (Signed)
 Received clearance request from [facility] [place] requesting advice for interruption of patients anticoagulant before colonoscopy. Per MD advised to stop 3 days prior to procedure and resume 1 day post procedure. Advised to obtain clearance from anesthesia from PCP. Faxed form over to [phone].

## 2024-11-09 NOTE — Telephone Encounter (Signed)
 Pt returning call. Please advise.

## 2024-11-12 ENCOUNTER — Ambulatory Visit: Admitting: Emergency Medicine

## 2024-11-12 ENCOUNTER — Encounter: Payer: Self-pay | Admitting: Emergency Medicine

## 2024-11-12 VITALS — BP 110/58 | HR 70 | Ht 65.0 in | Wt 229.0 lb

## 2024-11-12 DIAGNOSIS — I2699 Other pulmonary embolism without acute cor pulmonale: Secondary | ICD-10-CM

## 2024-11-12 DIAGNOSIS — J449 Chronic obstructive pulmonary disease, unspecified: Secondary | ICD-10-CM | POA: Diagnosis not present

## 2024-11-12 DIAGNOSIS — Z01811 Encounter for preprocedural respiratory examination: Secondary | ICD-10-CM | POA: Insufficient documentation

## 2024-11-12 MED ORDER — STIOLTO RESPIMAT 2.5-2.5 MCG/ACT IN AERS: 2.0000 | INHALATION_SPRAY | Freq: Every day | RESPIRATORY_TRACT | 1 refills | Status: AC

## 2024-11-12 NOTE — Patient Instructions (Addendum)
 We will get you restarted on Stiolto 2 puffs once daily.  Take this medication on a schedule. Keep your albuterol  available to use 2 puffs if needed for shortness of breath, chest tightness, wheezing. Based on your age, COPD, history of blood clots and other risk factors you are at intermediate risk for shoulder surgery under general anesthesia.  It would be preferred for you to be able to have a shoulder surgery under local block but general anesthesia is not precluded if the benefits outweigh these risks.  You would need to come off your Xarelto  for at least 2 days prior to the procedure.  It could then be restarted as soon as possible/acceptable postoperatively based on the recommendations of Dr. Kay with orthopedics. Follow with Dr. Shelah in 12 months or sooner if you have any problems.

## 2024-11-12 NOTE — Assessment & Plan Note (Signed)
 Her ARISCAT score is in the intermediate range. Discussed with her. No contraindications to proceeding as long as her COPD is not flaring. She knows that local nerve block would be preferred to general anesthesia  Based on your age, COPD, history of blood clots and other risk factors you are at intermediate risk for shoulder surgery under general anesthesia.  It would be preferred for you to be able to have a shoulder surgery under local block but general anesthesia is not precluded if the benefits outweigh these risks.  You would need to come off your Xarelto  for at least 2 days prior to the procedure.  It could then be restarted as soon as possible/acceptable postoperatively based on the recommendations of Dr. Kay with orthopedics.

## 2024-11-12 NOTE — Assessment & Plan Note (Signed)
 I had intended to restart her Stiolto when I saw her in September but this was not done.  I will order for her now.  She has albuterol  to use as needed.  Overall doing well, no flares.  Stable exertional dyspnea.  She did not desaturate on a walking oximetry in September.  We will get you restarted on Stiolto 2 puffs once daily.  Take this medication on a schedule. Keep your albuterol  available to use 2 puffs if needed for shortness of breath, chest tightness, wheezing. Follow with Dr. Shelah in 12 months or sooner if you have any problems.

## 2024-11-12 NOTE — Progress Notes (Signed)
 "  Subjective:    Patient ID: Kaitlin Arnold, female    DOB: 1936/04/25, 89 y.o.   MRN: [id number]   COPD She complains of shortness of breath. Her past medical history is significant for COPD.  Shortness of Breath Her past medical history is significant for COPD.   ROV 06/23/2024 --follow-up visit for 89 year old woman with history of former tobacco use, lupus anticoagulant with chronic thromboembolic disease on Xarelto .  Suspect that she has some COPD and restrictive lung disease with associated secondary PAH.  She is not desaturated on ambulation in the past.  She has been on Stiolto before but had stopped it just over a year ago and did not seem to miss it. She is using albuterol  about once a day, usually in the morning. She does ok most mornings. She is considering shoulder surgery with Dr Kay at [facility]. Minimal cough during the day, does cough some in the night. Non-productive. No bleeding reported.   FEV1  > 77% predicted 05/22/2020  ROV 11/12/2024 --Kaitlin Arnold is 89 with a history of former tobacco use, lupus anticoagulant with chronic VTE for which she has been treated with Xarelto .  Suspect that she may have some COPD and associated restrictive lung disease.  Echocardiogram reassuring without any evidence of PAH in 08/27/2024.  She has not desaturated with ambulation.  Has albuterol  to use as needed.  Minimal cough. She is planning for shoulder surgery in March. She reports that she can get some SOB w walking a longer distance. She can walk a flight of stairs. She is not on Stiolto - I had intended to restart. Uses albuterol  about once a day. No cough. No flares.    Review of Systems  Respiratory:  Positive for shortness of breath.    As per HPI     Objective:   Physical Exam Vitals:   11/12/24 1056  BP: (!) 110/58  Pulse: 70  SpO2: 97%  Weight: 229 lb (103.9 kg)  Height: 5' 5 (1.651 m)   Gen: Pleasant, overweight woman, in no distress,  normal affect  ENT: No lesions,   mouth clear,  oropharynx clear, no postnasal drip  Neck: No JVD, no stridor  Lungs: No use of accessory muscles, decreased bibasilar breath sounds.  No wheezes or crackles  Cardiovascular: RRR, holosystolic murmur 3/6 with an intact S2  Musculoskeletal: No deformities, no cyanosis or clubbing  Neuro: alert, awake, non focal  Skin: Warm, no lesions or rash     Assessment & Plan:  Bilateral pulmonary embolism (HCC) On chronic anticoagulation.  Did discuss with her that this would need to be held for 2 days in order to have her shoulder surgery done.  COPD (chronic obstructive pulmonary disease) (HCC) I had intended to restart her Stiolto when I saw her in September but this was not done.  I will order for her now.  She has albuterol  to use as needed.  Overall doing well, no flares.  Stable exertional dyspnea.  She did not desaturate on a walking oximetry in September.  We will get you restarted on Stiolto 2 puffs once daily.  Take this medication on a schedule. Keep your albuterol  available to use 2 puffs if needed for shortness of breath, chest tightness, wheezing. Follow with Dr. Shelah in 12 months or sooner if you have any problems.   Preoperative respiratory examination Her ARISCAT score is in the intermediate range. Discussed with her. No contraindications to proceeding as long as her COPD is not  flaring. She knows that local nerve block would be preferred to general anesthesia  Based on your age, COPD, history of blood clots and other risk factors you are at intermediate risk for shoulder surgery under general anesthesia.  It would be preferred for you to be able to have a shoulder surgery under local block but general anesthesia is not precluded if the benefits outweigh these risks.  You would need to come off your Xarelto  for at least 2 days prior to the procedure.  It could then be restarted as soon as possible/acceptable postoperatively based on the recommendations of Dr. Kay  with orthopedics.   I personally spent a total of 27 minutes in the care of the patient today including preparing to see the patient, getting/reviewing separately obtained history, performing a medically appropriate exam/evaluation, counseling and educating, placing orders, documenting clinical information in the EHR, independently interpreting results, and communicating results.   Lamar Chris, MD, PhD 11/12/2024, 11:58 AM [facility] [phone] or if no answer [phone]     "

## 2024-11-12 NOTE — Assessment & Plan Note (Signed)
 On chronic anticoagulation.  Did discuss with her that this would need to be held for 2 days in order to have her shoulder surgery done.

## 2024-11-17 NOTE — Telephone Encounter (Signed)
 Dr. Lanny ov note from 11/12/24 was faxed to [facility]

## 2025-01-07 ENCOUNTER — Ambulatory Visit (HOSPITAL_COMMUNITY): Admit: 2025-01-07 | Admitting: Orthopedic Surgery

## 2025-01-07 SURGERY — ARTHROPLASTY, SHOULDER, TOTAL, REVERSE
Anesthesia: Choice | Site: Shoulder | Laterality: Left
